# Patient Record
Sex: Female | Born: 1935
Health system: Southern US, Community
[De-identification: ages and names within clinical notes are randomized; demographics above are authoritative.]

## PROBLEM LIST (undated history)

## (undated) DIAGNOSIS — E559 Vitamin D deficiency, unspecified: Secondary | ICD-10-CM

## (undated) DIAGNOSIS — I447 Left bundle-branch block, unspecified: Secondary | ICD-10-CM

## (undated) DIAGNOSIS — F0390 Unspecified dementia without behavioral disturbance: Secondary | ICD-10-CM

## (undated) DIAGNOSIS — I10 Essential (primary) hypertension: Secondary | ICD-10-CM

## (undated) DIAGNOSIS — G4730 Sleep apnea, unspecified: Secondary | ICD-10-CM

## (undated) DIAGNOSIS — T8859XA Other complications of anesthesia, initial encounter: Secondary | ICD-10-CM

## (undated) DIAGNOSIS — E785 Hyperlipidemia, unspecified: Secondary | ICD-10-CM

## (undated) DIAGNOSIS — B079 Viral wart, unspecified: Secondary | ICD-10-CM

## (undated) DIAGNOSIS — R413 Other amnesia: Secondary | ICD-10-CM

## (undated) DIAGNOSIS — F419 Anxiety disorder, unspecified: Secondary | ICD-10-CM

## (undated) DIAGNOSIS — E7800 Pure hypercholesterolemia, unspecified: Secondary | ICD-10-CM

## (undated) DIAGNOSIS — T4145XA Adverse effect of unspecified anesthetic, initial encounter: Secondary | ICD-10-CM

## (undated) HISTORY — PX: CATARACT EXTRACTION, BILATERAL: SHX1313

## (undated) HISTORY — PX: TRIGGER FINGER RELEASE: SHX641

## (undated) HISTORY — PX: COLONOSCOPY: SHX174

## (undated) HISTORY — DX: Essential (primary) hypertension: I10

---
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
## 2009-12-13 ENCOUNTER — Ambulatory Visit: Payer: Self-pay | Admitting: Internal Medicine

## 2011-02-03 ENCOUNTER — Ambulatory Visit (INDEPENDENT_AMBULATORY_CARE_PROVIDER_SITE_OTHER): Payer: MEDICARE | Admitting: Internal Medicine

## 2011-02-03 ENCOUNTER — Other Ambulatory Visit (INDEPENDENT_AMBULATORY_CARE_PROVIDER_SITE_OTHER): Payer: Self-pay | Admitting: *Deleted

## 2011-02-03 ENCOUNTER — Encounter (INDEPENDENT_AMBULATORY_CARE_PROVIDER_SITE_OTHER): Payer: Self-pay | Admitting: Internal Medicine

## 2011-02-03 ENCOUNTER — Encounter (INDEPENDENT_AMBULATORY_CARE_PROVIDER_SITE_OTHER): Payer: Self-pay | Admitting: *Deleted

## 2011-02-03 VITALS — BP 110/72 | HR 76 | Temp 97.4°F | Resp 12 | Ht 62.0 in | Wt 112.2 lb

## 2011-02-03 DIAGNOSIS — K5900 Constipation, unspecified: Secondary | ICD-10-CM

## 2011-02-03 DIAGNOSIS — K5909 Other constipation: Secondary | ICD-10-CM | POA: Insufficient documentation

## 2011-02-03 DIAGNOSIS — I10 Essential (primary) hypertension: Secondary | ICD-10-CM | POA: Insufficient documentation

## 2011-02-03 MED ORDER — LUBIPROSTONE 8 MCG PO CAPS: 16.0000 ug | ORAL_CAPSULE | Freq: Every day | ORAL | Status: DC

## 2011-02-03 NOTE — Progress Notes (Signed)
Presenting complaint; Worsening constipation. Patient in need of colonoscopy. History of present illness. Jackie Phelps is a 78 year old Caucasian female who presents with worsening constipation. She has had this symptom for years but lately having more problems. She can go anywhere from 3-6 days without a bowel movement. She saw her primary care physician Dr. Rolly Pancake of [facility] who prescribed MiraLax and schedule her for colonoscopy. She noted satisfactory result with MiraLax initially but is not working anymore. She even doubled up on those one day but without any results. She was also scheduled for colonoscopy at the [facility] but she states it would be impossible for her to take prep and have a 17 miles and she therefore wants to have an exam at [facility].  she did have colonoscopy timing back in October 2000 revealing few diverticula sigmoid and transverse colon. She believes she had another colonoscopy without was well over 5 years ago. We do not have any records. Patient denies melena or rectal bleeding. She does complain of abdominal bloating but no pain or cramps. She has lost 6 pounds since her last visit in December 2011. She states she has a good appetite however she does not eat 3 meals a day. She denies heartburn nausea vomiting fever or chills. She states she takes diazepam because she is hyper. Farin states that she had complete blood work about 4 months ago at [facility]  and it was normal. She also has had 2 normal bone density studies. She is up-to-date on her mammography and pelvic exam in We be doing made 2013. Current Medications: Current Outpatient Prescriptions  Medication Sig Dispense Refill  . aspirin 81 MG tablet Take 81 mg by mouth daily.      . carvedilol (COREG) 6.25 MG tablet Take 6.25 mg by mouth 2 (two) times daily with a meal.      . diazepam (VALIUM) 5 MG tablet Take 5 mg by mouth as needed.      . fish oil-omega-3 fatty acids 1000 MG capsule Take 2 g by mouth daily.      Marland Kitchen  HYDROCHLOROTHIAZIDE PO Take 25 mg by mouth. Per the patient she takes 2 in the morning, 2 in the evening      . SIMVASTATIN PO Take by mouth daily.       past medical history; Hypertension. Hyperlipidemia. Hyperactivity(patient states she is hyper) Allergies;  Penicillin. Family history; Mother lived to be 73. Father had multiple myeloma and died at 11. One brother died at age 25.  She has a sister age 8 was doing fairly well. Social history; She is married. She has one daughter in good health. She smokes sporadically for a few years but quit over 40 years ago. She drinks glass or 2 of wine every now and then but not daily  Objective: Blood pressure 110/72, pulse 76, temperature 97.4 F (36.3 C), temperature source Oral, resp. rate 12, height 5\' 2"  (1.575 m), weight 112 lb 3.2 oz (50.894 kg). Pleasant well developed thin Caucasian female who is in no acute distress. Conjunctiva is pink. Sclera is nonicteric Oral pharyngeal mucosa is normal. No neck masses or thyromegaly noted. Cardiac exam with regular rhythm normal S1 and S2. No murmur or gallop noted. Lungs are clear to auscultation. Abdomen is symmetrical. Bowel sounds are normal. On palpation soft and nontender without organomegaly or masses. Rectal examination deferred. No LE edema or clubbing noted.  Assessment: Worsening constipation and otherwise healthy 78 year old female. Mild structural abnormality needs to be ruled out she most  likely has colonic dysmotility. Her last colonoscopy was over 5 years ago but we do not have any record. Patient had normal complete blood count last fall and I presume her serum calcium and TSH were normal   Plan: High fiber diet. Patient advised to eat 3 meals daily. Start Amitiza 16 mcg by mouth each bedtime. 16 doses (samples) given. He will be scheduled for a diagnostic colonoscopy at [facility] as it would be difficult for her to travel to [facility]. Patient also advised to take Os-Cal 500 one  tablet twice daily

## 2011-02-03 NOTE — Patient Instructions (Signed)
High fiber diet as discussed. Needs to eat 3 meals a day. Call if you experience side effects with Amitiza. Colonoscopy to be scheduled.

## 2011-02-25 ENCOUNTER — Ambulatory Visit (INDEPENDENT_AMBULATORY_CARE_PROVIDER_SITE_OTHER): Payer: Self-pay | Admitting: Internal Medicine

## 2011-03-03 ENCOUNTER — Encounter (HOSPITAL_COMMUNITY): Payer: Self-pay | Admitting: Pharmacy Technician

## 2011-03-04 MED ORDER — SODIUM CHLORIDE 0.45 % IV SOLN
Freq: Once | INTRAVENOUS | Status: AC
  Administered 2011-03-05: 11:00:00 via INTRAVENOUS

## 2011-03-05 ENCOUNTER — Encounter (HOSPITAL_COMMUNITY)
Admission: RE | Disposition: A | Discharge status: Home / Self Care | Payer: Self-pay | Source: Ambulatory Visit | Attending: Internal Medicine

## 2011-03-05 ENCOUNTER — Ambulatory Visit (HOSPITAL_COMMUNITY)
Admission: RE | Admit: 2011-03-05 | Discharge: 2011-03-05 | Disposition: A | Discharge status: Home / Self Care | Payer: Medicare Other | Source: Ambulatory Visit | Attending: Internal Medicine | Admitting: Internal Medicine

## 2011-03-05 ENCOUNTER — Encounter (HOSPITAL_COMMUNITY): Payer: Self-pay | Admitting: *Deleted

## 2011-03-05 DIAGNOSIS — K5730 Diverticulosis of large intestine without perforation or abscess without bleeding: Secondary | ICD-10-CM

## 2011-03-05 DIAGNOSIS — Z79899 Other long term (current) drug therapy: Secondary | ICD-10-CM | POA: Insufficient documentation

## 2011-03-05 DIAGNOSIS — K5909 Other constipation: Secondary | ICD-10-CM | POA: Insufficient documentation

## 2011-03-05 DIAGNOSIS — E785 Hyperlipidemia, unspecified: Secondary | ICD-10-CM | POA: Insufficient documentation

## 2011-03-05 DIAGNOSIS — K5900 Constipation, unspecified: Secondary | ICD-10-CM

## 2011-03-05 DIAGNOSIS — I10 Essential (primary) hypertension: Secondary | ICD-10-CM | POA: Insufficient documentation

## 2011-03-05 DIAGNOSIS — K6389 Other specified diseases of intestine: Secondary | ICD-10-CM | POA: Insufficient documentation

## 2011-03-05 HISTORY — PX: COLONOSCOPY: SHX5424

## 2011-03-05 HISTORY — DX: Pure hypercholesterolemia, unspecified: E78.00

## 2011-03-05 HISTORY — DX: Anxiety disorder, unspecified: F41.9

## 2011-03-05 SURGERY — COLONOSCOPY
Anesthesia: Moderate Sedation

## 2011-03-05 MED ORDER — MEPERIDINE HCL 50 MG/ML IJ SOLN
INTRAMUSCULAR | Status: DC | PRN
  Administered 2011-03-05 (×2): 25 mg via INTRAVENOUS

## 2011-03-05 MED ORDER — STERILE WATER FOR IRRIGATION IR SOLN
Status: DC | PRN
  Administered 2011-03-05: 11:00:00

## 2011-03-05 MED ORDER — MEPERIDINE HCL 50 MG/ML IJ SOLN
INTRAMUSCULAR | Status: AC
  Filled 2011-03-05: qty 1

## 2011-03-05 MED ORDER — MIDAZOLAM HCL 5 MG/5ML IJ SOLN
INTRAMUSCULAR | Status: AC
  Filled 2011-03-05: qty 10

## 2011-03-05 MED ORDER — MIDAZOLAM HCL 5 MG/5ML IJ SOLN
INTRAMUSCULAR | Status: DC | PRN
  Administered 2011-03-05: 1 mg via INTRAVENOUS
  Administered 2011-03-05: 2 mg via INTRAVENOUS
  Administered 2011-03-05: 1 mg via INTRAVENOUS

## 2011-03-05 MED ORDER — LUBIPROSTONE 8 MCG PO CAPS: 16.0000 ug | ORAL_CAPSULE | Freq: Two times a day (BID) | ORAL | Status: AC

## 2011-03-05 NOTE — H&P (Signed)
This is an update to history and physical from 02/03/2011. She noted excellent response with Amitiza. She is undergoing diagnostic colonoscopy

## 2011-03-05 NOTE — Discharge Instructions (Signed)
Resume usual medications and high fiber diet. Amitiza 16 mcg by mouth daily at bedtime. No driving for 24 hours

## 2011-03-05 NOTE — Op Note (Signed)
COLONOSCOPY PROCEDURE REPORT  PATIENT:  Jackie Phelps  MR#:  [id number] Birthdate:  May 10, 1931, 78 y.o., female Endoscopist:  Dr. Malissa Hippo, MD Referred By:  Dr. Rolly Pancake, MD Procedure Date: 03/05/2011  Procedure:   Colonoscopy  Indications: Patient is 78 year old Caucasian female who is undergoing diagnostic colonoscopy for worsening constipation.  Informed Consent:  Procedure and risks were reviewed with the patient and informed consent was obtained. Medications:  Demerol 50 mg IV Versed 4 mg IV  Description of procedure:  After a digital rectal exam was performed, that colonoscope was advanced from the anus through the rectum and colon to the area of the cecum, ileocecal valve and appendiceal orifice. The cecum was deeply intubated. These structures were well-seen and photographed for the record. From the level of the cecum and ileocecal valve, the scope was slowly and cautiously withdrawn. The mucosal surfaces were carefully surveyed utilizing scope tip to flexion to facilitate fold flattening as needed. The scope was pulled down into the rectum where a thorough exam including retroflexion was performed.  Findings:   Prep excellent. Few diverticula in sigmoid colon and two  at  hepatic flexure. Redundant colon without polyps or other mucosal abnormalities Normal rectal mucosa and ano-rectal junction.  Therapeutic/Diagnostic Maneuvers Performed:  None  Complications:  None  Cecal Withdrawal Time:  7 minutes  Impression:  Examination performed to cecum. Redundant colon with a few diverticula and sigmoid colon and two at hepatic flexure.  Recommendations:  High fiber diet. Amitiza 16 mcg by mouth daily.  Michaiah Holsopple U  03/05/2011 11:57 AM  CC: Dr. Rolly Pancake, MD, MD & Dr. Bonnetta Barry ref. provider found

## 2011-03-11 ENCOUNTER — Encounter (HOSPITAL_COMMUNITY): Payer: Self-pay | Admitting: Internal Medicine

## 2011-03-17 ENCOUNTER — Telehealth (INDEPENDENT_AMBULATORY_CARE_PROVIDER_SITE_OTHER): Payer: Self-pay | Admitting: *Deleted

## 2011-03-17 NOTE — Telephone Encounter (Signed)
Patient called and LM to please let Dr. Karilyn Cota know she was serverly dehydrated and constipated. Jackie Phelps is being taken care of and has gone back to her dr at [facility].

## 2011-03-18 NOTE — Telephone Encounter (Signed)
I called patient but there was no answer.

## 2012-08-31 ENCOUNTER — Ambulatory Visit (INDEPENDENT_AMBULATORY_CARE_PROVIDER_SITE_OTHER): Payer: Medicare Other | Admitting: Internal Medicine

## 2012-08-31 ENCOUNTER — Encounter (INDEPENDENT_AMBULATORY_CARE_PROVIDER_SITE_OTHER): Payer: Self-pay | Admitting: Internal Medicine

## 2012-08-31 VITALS — BP 108/60 | HR 76 | Temp 98.3°F | Ht 63.0 in | Wt 116.9 lb

## 2012-08-31 DIAGNOSIS — K5900 Constipation, unspecified: Secondary | ICD-10-CM

## 2012-08-31 MED ORDER — LUBIPROSTONE 8 MCG PO CAPS: 8.0000 ug | ORAL_CAPSULE | Freq: Every day | ORAL | Status: DC

## 2012-08-31 NOTE — Patient Instructions (Addendum)
Fiber 4gm po. Continue prunes. Samples of Amitiza x 2 boxes given to patient. Rx eprescribed to her drug store. Stool diary. OV in 8 weeks.

## 2012-08-31 NOTE — Progress Notes (Signed)
Subjective:     Patient ID: Jackie Phelps, female   DOB: 26-Jul-1931, 79 y.o.   MRN: [id number]  HPI Here today with c/o that she is constipated.  She tells me when she has a BM she has a small BM. This am she had a small BM and afterwards it was liquidity.  She has to strain to have a BM She has to wipe a lot. She has some seepage. Worsening smptoms for about a month. She tells me she drinks plenty of water.  She actually has a hx of chronic constipatin. Her primary care is att [facility] and sees Dr. Rolly Pancake.  She was seen last year and underwent a colonoscopy. Dr. Karilyn Cota gave her samples and she tells me the Amitza helped.  . She has not had a good BM in over a month. She has tried Citracel, Ducoloax, and Magnesioum Citrate which has not helped.  Her last colonoscopy wasin 2013. Her appetite is good. No weight loss. She tells me she has gained 3 pounds since becoming constipatied. 03/05/2011 Colonoscopy: worsening constipation:" Impression:  Examination performed to cecum.  Redundant colon with a few diverticula and sigmoid colon and two at hepatic flexure.       Review of Systems see hpi Current Outpatient Prescriptions  Medication Sig Dispense Refill  . amLODipine (NORVASC) 2.5 MG tablet Take 2.5 mg by mouth daily.      Marland Kitchen aspirin 81 MG tablet Take 81 mg by mouth daily. Twice a day      . carvedilol (COREG) 6.25 MG tablet Take 12.5 mg by mouth 2 (two) times daily with a meal.       . diazepam (VALIUM) 5 MG tablet Take 5 mg by mouth as needed.      . multivitamin-lutein (OCUVITE-LUTEIN) CAPS Take 1 capsule by mouth daily.      . simvastatin (ZOCOR) 20 MG tablet Take 20 mg by mouth daily.      Marland Kitchen lubiprostone (AMITIZA) 8 MCG capsule Take 1 capsule (8 mcg total) by mouth daily.  30 capsule  5   No current facility-administered medications for this visit.   Past Medical History  Diagnosis Date  . Hypertension   . Hypercholesteremia   . Anxiety   . Constipation    Past Surgical  History  Procedure Laterality Date  . Colonoscopy    . Colonoscopy  03/05/2011    Procedure: COLONOSCOPY;  Surgeon: Malissa Hippo, MD;  Location: [facility];  Service: Endoscopy;  Laterality: N/A;  1200   Allergies  Allergen Reactions  . Penicillins Rash         Objective:   Physical Exam  Filed Vitals:   08/31/12 1057  BP: 108/60  Pulse: 76  Temp: 98.3 F (36.8 C)  Height: 5\' 3"  (1.6 m)  Weight: 116 lb 14.4 oz (53.025 kg)  Alert and oriented. Skin warm and dry. Oral mucosa is moist.   . Sclera anicteric, conjunctivae is pink. Thyroid not enlarged. No cervical lymphadenopathy. Lungs clear. Heart regular rate and rhythm.  Abdomen is soft. Bowel sounds are positive. No hepatomegaly. No abdominal masses felt. No tenderness.  No edema to lower extremities.   There were no vitals filed for this visit.     Assessment:   Constipation: chronic. Hx of recent colonoscopy which was normal.  Patient has tried Citricel, Ducolax, ang Magnesium Citrate without results for her constipation. She did have a small BM this am and one last week. No impaction felt on  exam this am. Stool was soft in her rectum. She could have an impaction higher. Her abdomen is however soft and BS are positive    Plan:     Fiber 4gm. Continues the prunes. Samples of Amitiza x 2 boxes given to patient. Rx eprescribed to her drug store. Stool diary. OV in 8 weeks. If no BM in 5 day, try a Fleet's enema. If no results you may need to go to the ED.

## 2012-09-09 ENCOUNTER — Encounter (INDEPENDENT_AMBULATORY_CARE_PROVIDER_SITE_OTHER): Payer: Self-pay | Admitting: *Deleted

## 2012-09-09 NOTE — Progress Notes (Signed)
This encounter was created in error - please disregard.

## 2012-09-16 ENCOUNTER — Telehealth (INDEPENDENT_AMBULATORY_CARE_PROVIDER_SITE_OTHER): Payer: Self-pay | Admitting: Internal Medicine

## 2012-09-16 NOTE — Telephone Encounter (Signed)
She says Amitiza is not helping. I advised her to take one in the am and one in the Pm and call me Wednesday. If no BM she needs to take a Fleets enema. She has a hx of a redundant colon.

## 2012-09-23 ENCOUNTER — Telehealth (INDEPENDENT_AMBULATORY_CARE_PROVIDER_SITE_OTHER): Payer: Self-pay | Admitting: *Deleted

## 2012-09-23 NOTE — Telephone Encounter (Signed)
Please let Terri know the Valinda Hoar is doing so so. If she would like to speak with her the return phone number is [phone].

## 2012-09-29 NOTE — Telephone Encounter (Signed)
Has seen her Dr and was prescribed Losartan. I advised her to call her Dr. Janese Banks at [facility] about this medication. If she needs samples, I have samples of Linzess for her.

## 2012-09-29 NOTE — Telephone Encounter (Signed)
She was also placed on Metamucil and a stool softener. She was told to stop the Amitiza

## 2012-10-26 ENCOUNTER — Ambulatory Visit (INDEPENDENT_AMBULATORY_CARE_PROVIDER_SITE_OTHER): Payer: Medicare Other | Admitting: Internal Medicine

## 2013-10-15 ENCOUNTER — Encounter (HOSPITAL_COMMUNITY): Payer: Self-pay | Admitting: Emergency Medicine

## 2013-10-15 ENCOUNTER — Emergency Department (HOSPITAL_COMMUNITY)
Admission: EM | Admit: 2013-10-15 | Discharge: 2013-10-15 | Disposition: A | Discharge status: Home / Self Care | Payer: Medicare Other | Attending: Emergency Medicine | Admitting: Emergency Medicine

## 2013-10-15 DIAGNOSIS — F419 Anxiety disorder, unspecified: Secondary | ICD-10-CM | POA: Diagnosis not present

## 2013-10-15 DIAGNOSIS — Z7982 Long term (current) use of aspirin: Secondary | ICD-10-CM | POA: Insufficient documentation

## 2013-10-15 DIAGNOSIS — E780 Pure hypercholesterolemia: Secondary | ICD-10-CM | POA: Insufficient documentation

## 2013-10-15 DIAGNOSIS — Z880 Allergy status to penicillin: Secondary | ICD-10-CM | POA: Insufficient documentation

## 2013-10-15 DIAGNOSIS — I10 Essential (primary) hypertension: Secondary | ICD-10-CM | POA: Insufficient documentation

## 2013-10-15 DIAGNOSIS — Z79899 Other long term (current) drug therapy: Secondary | ICD-10-CM | POA: Diagnosis not present

## 2013-10-15 DIAGNOSIS — N390 Urinary tract infection, site not specified: Secondary | ICD-10-CM | POA: Diagnosis not present

## 2013-10-15 DIAGNOSIS — Z8719 Personal history of other diseases of the digestive system: Secondary | ICD-10-CM | POA: Diagnosis not present

## 2013-10-15 DIAGNOSIS — Z87891 Personal history of nicotine dependence: Secondary | ICD-10-CM | POA: Insufficient documentation

## 2013-10-15 DIAGNOSIS — R319 Hematuria, unspecified: Secondary | ICD-10-CM | POA: Diagnosis present

## 2013-10-15 LAB — URINALYSIS, ROUTINE W REFLEX MICROSCOPIC
Bilirubin Urine: NEGATIVE
Glucose, UA: NEGATIVE mg/dL
KETONES UR: NEGATIVE mg/dL
NITRITE: NEGATIVE
PROTEIN: 30 mg/dL — AB
Specific Gravity, Urine: <1.005 — ABNORMAL LOW (ref 1.005–1.030)
Urobilinogen, UA: 0.2 mg/dL (ref 0.0–1.0)
pH: 6.5 (ref 5.0–8.0)

## 2013-10-15 LAB — URINE MICROSCOPIC-ADD ON

## 2013-10-15 MED ORDER — SULFAMETHOXAZOLE-TMP DS 800-160 MG PO TABS: 1.0000 | ORAL_TABLET | Freq: Two times a day (BID) | ORAL | Status: DC

## 2013-10-15 NOTE — Discharge Instructions (Signed)
Urinary Tract Infection Urinary tract infections (UTIs) can develop anywhere along your urinary tract. Your urinary tract is your body's drainage system for removing wastes and extra water. Your urinary tract includes two kidneys, two ureters, a bladder, and a urethra. Your kidneys are a pair of bean-shaped organs. Each kidney is about the size of your fist. They are located below your ribs, one on each side of your spine. CAUSES Infections are caused by microbes, which are microscopic organisms, including fungi, viruses, and bacteria. These organisms are so small that they can only be seen through a microscope. Bacteria are the microbes that most commonly cause UTIs. SYMPTOMS  Symptoms of UTIs may vary by age and gender of the patient and by the location of the infection. Symptoms in young women typically include a frequent and intense urge to urinate and a painful, burning feeling in the bladder or urethra during urination. Older women and men are more likely to be tired, shaky, and weak and have muscle aches and abdominal pain. A fever may mean the infection is in your kidneys. Other symptoms of a kidney infection include pain in your back or sides below the ribs, nausea, and vomiting. DIAGNOSIS To diagnose a UTI, your caregiver will ask you about your symptoms. Your caregiver also will ask to provide a urine sample. The urine sample will be tested for bacteria and white blood cells. White blood cells are made by your body to help fight infection. TREATMENT  Typically, UTIs can be treated with medication. Because most UTIs are caused by a bacterial infection, they usually can be treated with the use of antibiotics. The choice of antibiotic and length of treatment depend on your symptoms and the type of bacteria causing your infection. HOME CARE INSTRUCTIONS  If you were prescribed antibiotics, take them exactly as your caregiver instructs you. Finish the medication even if you feel better after you  have only taken some of the medication.  Drink enough water and fluids to keep your urine clear or pale yellow.  Avoid caffeine, tea, and carbonated beverages. They tend to irritate your bladder.  Empty your bladder often. Avoid holding urine for long periods of time.  Empty your bladder before and after sexual intercourse.  After a bowel movement, women should cleanse from front to back. Use each tissue only once. SEEK MEDICAL CARE IF:   You have back pain.  You develop a fever.  Your symptoms do not begin to resolve within 3 days. SEEK IMMEDIATE MEDICAL CARE IF:   You have severe back pain or lower abdominal pain.  You develop chills.  You have nausea or vomiting.  You have continued burning or discomfort with urination. MAKE SURE YOU:   Understand these instructions.  Will watch your condition.  Will get help right away if you are not doing well or get worse. Document Released: 10/02/2004 Document Revised: 06/24/2011 Document Reviewed: 01/31/2011 [facility] Patient Information 2015 [place], [place]. This information is not intended to replace advice given to you by your health care provider. Make sure you discuss any questions you have with your health care provider.   As discussed, you will need to have a repeat urinalysis the week after your antibiotic is completed to make sure your infection is resolved.  Make sure you are drinking plenty of fluids.

## 2013-10-15 NOTE — ED Notes (Signed)
Pt noticed blood after wiping after voiding today.  Denies pain, denies frequency.

## 2013-10-17 NOTE — ED Provider Notes (Signed)
CSN: [id number]     Arrival date & time 10/15/13  1207 History   First MD Initiated Contact with Patient 10/15/13 1220     Chief Complaint  Patient presents with  . Hematuria     (Consider location/radiation/quality/duration/timing/severity/associated sxs/prior Treatment) Patient is a 80 y.o. female presenting with hematuria. The history is provided by the patient.  Hematuria This is a new problem. The current episode started today. The problem has been unchanged. Pertinent negatives include no abdominal pain, anorexia, change in bowel habit, chills, fatigue, fever, myalgias, nausea or vomiting. Associated symptoms comments: Denies painful urination, but has had increased urinary frequency.  She denies history of uti's and kidney stones, has had no recent exposures to abx.  Denies flank pain.. Nothing aggravates the symptoms. She has tried nothing for the symptoms.    Past Medical History  Diagnosis Date  . Hypertension   . Hypercholesteremia   . Anxiety   . Constipation    Past Surgical History  Procedure Laterality Date  . Colonoscopy    . Colonoscopy  03/05/2011    Procedure: COLONOSCOPY;  Surgeon: Rogene Houston, MD;  Location: [facility];  Service: Endoscopy;  Laterality: N/A;  1200   Family History  Problem Relation Age of Onset  . Hypertension Mother   . Healthy Daughter   . Colon cancer Neg Hx    History  Substance Use Topics  . Smoking status: Former Smoker    Types: Cigarettes    Quit date: 02/02/1966  . Smokeless tobacco: Never Used  . Alcohol Use: 1.2 oz/week    2 Glasses of wine per week     Comment: Drinks wine   OB History   Grav Para Term Preterm Abortions TAB SAB Ect Mult Living                 Review of Systems  Constitutional: Negative for fever, chills and fatigue.  Gastrointestinal: Negative for nausea, vomiting, abdominal pain, anorexia and change in bowel habit.  Genitourinary: Positive for frequency and hematuria. Negative for dysuria,  urgency, flank pain, difficulty urinating and pelvic pain.  Musculoskeletal: Negative for myalgias.      Allergies  Penicillins  Home Medications   Prior to Admission medications   Medication Sig Start Date End Date Taking? Authorizing Provider  aspirin 81 MG tablet Take 81 mg by mouth daily. Twice a day   Yes Historical Provider, MD  carvedilol (COREG) 6.25 MG tablet Take 12.5 mg by mouth 2 (two) times daily with a meal.    Yes Historical Provider, MD  diazepam (VALIUM) 5 MG tablet Take 5 mg by mouth as needed.   Yes Historical Provider, MD  losartan (COZAAR) 50 MG tablet Take 50 mg by mouth daily.   Yes Historical Provider, MD  multivitamin-lutein (OCUVITE-LUTEIN) CAPS Take 1 capsule by mouth daily.   Yes Historical Provider, MD  simvastatin (ZOCOR) 20 MG tablet Take 20 mg by mouth daily.   Yes Historical Provider, MD  sulfamethoxazole-trimethoprim (BACTRIM DS) 800-160 MG per tablet Take 1 tablet by mouth 2 (two) times daily. 10/15/13   Evalee Jefferson, PA-C   BP 146/65  Pulse 80  Temp(Src) 98.5 F (36.9 C) (Oral)  Resp 18  SpO2 100% Physical Exam  Nursing note and vitals reviewed. Constitutional: She appears well-developed and well-nourished.  HENT:  Head: Normocephalic and atraumatic.  Eyes: Conjunctivae are normal.  Neck: Normal range of motion.  Cardiovascular: Normal rate, regular rhythm, normal heart sounds and intact distal pulses.  Pulmonary/Chest: Effort normal and breath sounds normal. She has no wheezes.  Abdominal: Soft. Bowel sounds are normal. She exhibits no distension and no mass. There is no tenderness. There is no guarding.  No cva ttp.  Musculoskeletal: Normal range of motion.  Neurological: She is alert.  Skin: Skin is warm and dry.  Psychiatric: She has a normal mood and affect.    ED Course  Procedures (including critical care time) Labs Review Labs Reviewed  URINALYSIS, ROUTINE W REFLEX MICROSCOPIC - Abnormal; Notable for the following:    Color,  Urine AMBER (*)    APPearance HAZY (*)    Specific Gravity, Urine <1.005 (*)    Hgb urine dipstick LARGE (*)    Protein, ur 30 (*)    Leukocytes, UA MODERATE (*)    All other components within normal limits  URINE MICROSCOPIC-ADD ON - Abnormal; Notable for the following:    Bacteria, UA FEW (*)    All other components within normal limits  URINE CULTURE    Imaging Review No results found.   EKG Interpretation None      MDM   Final diagnoses:  UTI (lower urinary tract infection)    Pt placed on bactrim, first dose given in ed.  Advised f/u with pcp, encouraged increased fluids.  Return here for fevers, chills, nausea, pain.  Also advised she needs a repeat UA the week after abx completed.  Her pcp is at [place] lab order given for repeat UA - can get at our lab or lab of her choice in [place] where she lives.  Results to be sent to her pcp.  Pt and daughter at bedside understand plan.  The patient appears reasonably screened and/or stabilized for discharge and I doubt any other medical condition or other [facility] requiring further screening, evaluation, or treatment in the ED at this time prior to discharge.     Evalee Jefferson, PA-C 10/17/13 [id number]

## 2013-10-18 LAB — URINE CULTURE
Colony Count: 30000
SPECIAL REQUESTS: NORMAL

## 2013-10-18 NOTE — ED Provider Notes (Signed)
Medical screening examination/treatment/procedure(s) were performed by non-physician practitioner and as supervising physician I was immediately available for consultation/collaboration.   EKG Interpretation None        Maudry Diego, MD 10/18/13 1428

## 2013-10-19 ENCOUNTER — Telehealth (HOSPITAL_BASED_OUTPATIENT_CLINIC_OR_DEPARTMENT_OTHER): Payer: Self-pay | Admitting: Emergency Medicine

## 2013-10-19 NOTE — Telephone Encounter (Signed)
Post ED Visit - Positive Culture Follow-up  Culture report reviewed by antimicrobial stewardship pharmacist: []  Wes [place], Pharm.D., BCPS [x]  Heide Guile, Pharm.D., BCPS []  Alycia Rossetti, Pharm.D., BCPS []  [place], [place].D., BCPS, AAHIVP []  Legrand Como, Pharm.D., BCPS, AAHIVP []  Carly Sabat, Pharm.D. []  Elenor Quinones, Pharm.D.  Positive urine culture Treated with sulfamethoxazole, organism sensitive to the same and no further patient follow-up is required at this time.  Hazle Nordmann 10/19/2013, 10:59 AM

## 2013-10-27 ENCOUNTER — Telehealth (HOSPITAL_BASED_OUTPATIENT_CLINIC_OR_DEPARTMENT_OTHER): Payer: Self-pay | Admitting: Emergency Medicine

## 2014-01-08 ENCOUNTER — Inpatient Hospital Stay (HOSPITAL_COMMUNITY)
Admission: EM | Admit: 2014-01-08 | Discharge: 2014-01-12 | DRG: 470 | Disposition: A | Discharge status: Skilled Nursing Facility | Payer: Medicare Other | Attending: Internal Medicine | Admitting: Internal Medicine

## 2014-01-08 ENCOUNTER — Emergency Department (HOSPITAL_COMMUNITY): Payer: Medicare Other

## 2014-01-08 ENCOUNTER — Inpatient Hospital Stay (HOSPITAL_COMMUNITY): Payer: Medicare Other | Admitting: Anesthesiology

## 2014-01-08 ENCOUNTER — Inpatient Hospital Stay (HOSPITAL_COMMUNITY): Payer: Medicare Other

## 2014-01-08 ENCOUNTER — Encounter (HOSPITAL_COMMUNITY)
Admission: EM | Disposition: A | Discharge status: Skilled Nursing Facility | Payer: Self-pay | Source: Home / Self Care | Attending: Internal Medicine

## 2014-01-08 ENCOUNTER — Encounter (HOSPITAL_COMMUNITY): Payer: Self-pay | Admitting: Emergency Medicine

## 2014-01-08 DIAGNOSIS — F419 Anxiety disorder, unspecified: Secondary | ICD-10-CM | POA: Diagnosis present

## 2014-01-08 DIAGNOSIS — E785 Hyperlipidemia, unspecified: Secondary | ICD-10-CM | POA: Diagnosis present

## 2014-01-08 DIAGNOSIS — Z96649 Presence of unspecified artificial hip joint: Secondary | ICD-10-CM

## 2014-01-08 DIAGNOSIS — I10 Essential (primary) hypertension: Secondary | ICD-10-CM | POA: Diagnosis present

## 2014-01-08 DIAGNOSIS — S72002A Fracture of unspecified part of neck of left femur, initial encounter for closed fracture: Principal | ICD-10-CM | POA: Diagnosis present

## 2014-01-08 DIAGNOSIS — Z8249 Family history of ischemic heart disease and other diseases of the circulatory system: Secondary | ICD-10-CM

## 2014-01-08 DIAGNOSIS — W1839XA Other fall on same level, initial encounter: Secondary | ICD-10-CM | POA: Diagnosis present

## 2014-01-08 DIAGNOSIS — M25559 Pain in unspecified hip: Secondary | ICD-10-CM

## 2014-01-08 DIAGNOSIS — Z87891 Personal history of nicotine dependence: Secondary | ICD-10-CM

## 2014-01-08 DIAGNOSIS — K5900 Constipation, unspecified: Secondary | ICD-10-CM | POA: Diagnosis not present

## 2014-01-08 DIAGNOSIS — D62 Acute posthemorrhagic anemia: Secondary | ICD-10-CM | POA: Diagnosis not present

## 2014-01-08 DIAGNOSIS — E780 Pure hypercholesterolemia: Secondary | ICD-10-CM | POA: Diagnosis present

## 2014-01-08 DIAGNOSIS — Z7982 Long term (current) use of aspirin: Secondary | ICD-10-CM

## 2014-01-08 DIAGNOSIS — K5909 Other constipation: Secondary | ICD-10-CM | POA: Diagnosis present

## 2014-01-08 HISTORY — PX: HIP ARTHROPLASTY: SHX981

## 2014-01-08 LAB — URINALYSIS, ROUTINE W REFLEX MICROSCOPIC
Bilirubin Urine: NEGATIVE
Glucose, UA: NEGATIVE mg/dL
KETONES UR: NEGATIVE mg/dL
Leukocytes, UA: NEGATIVE
NITRITE: NEGATIVE
PH: 7.5 (ref 5.0–8.0)
PROTEIN: NEGATIVE mg/dL
Specific Gravity, Urine: 1.012 (ref 1.005–1.030)
UROBILINOGEN UA: 0.2 mg/dL (ref 0.0–1.0)

## 2014-01-08 LAB — BASIC METABOLIC PANEL
ANION GAP: 10 (ref 5–15)
BUN: 19 mg/dL (ref 6–23)
CHLORIDE: 105 meq/L (ref 96–112)
CO2: 24 mmol/L (ref 19–32)
CREATININE: 0.86 mg/dL (ref 0.50–1.10)
Calcium: 9.4 mg/dL (ref 8.4–10.5)
GFR, EST AFRICAN AMERICAN: 72 mL/min — AB (ref >90)
GFR, EST NON AFRICAN AMERICAN: 62 mL/min — AB (ref >90)
Glucose, Bld: 143 mg/dL — ABNORMAL HIGH (ref 70–99)
POTASSIUM: 3.8 mmol/L (ref 3.5–5.1)
Sodium: 139 mmol/L (ref 135–145)

## 2014-01-08 LAB — CBC
HCT: 38.7 % (ref 36.0–46.0)
Hemoglobin: 12.6 g/dL (ref 12.0–15.0)
MCH: 29.1 pg (ref 26.0–34.0)
MCHC: 32.6 g/dL (ref 30.0–36.0)
MCV: 89.4 fL (ref 78.0–100.0)
PLATELETS: 173 10*3/uL (ref 150–400)
RBC: 4.33 MIL/uL (ref 3.87–5.11)
RDW: 12.7 % (ref 11.5–15.5)
WBC: 10.5 10*3/uL (ref 4.0–10.5)

## 2014-01-08 LAB — URINE MICROSCOPIC-ADD ON

## 2014-01-08 SURGERY — HEMIARTHROPLASTY, HIP, DIRECT ANTERIOR APPROACH, FOR FRACTURE
Anesthesia: Monitor Anesthesia Care | Site: Hip | Laterality: Left

## 2014-01-08 MED ORDER — ASPIRIN 81 MG PO CHEW
81.0000 mg | CHEWABLE_TABLET | Freq: Every day | ORAL | Status: DC
  Administered 2014-01-09 – 2014-01-12 (×4): 81 mg via ORAL
  Filled 2014-01-08 (×4): qty 1

## 2014-01-08 MED ORDER — ACETAMINOPHEN 650 MG RE SUPP: 650.0000 mg | Freq: Four times a day (QID) | RECTAL | Status: DC | PRN

## 2014-01-08 MED ORDER — OXYCODONE HCL 5 MG PO TABS: 5.0000 mg | ORAL_TABLET | ORAL | Status: DC | PRN

## 2014-01-08 MED ORDER — ONDANSETRON HCL 4 MG/2ML IJ SOLN
4.0000 mg | Freq: Four times a day (QID) | INTRAMUSCULAR | Status: DC | PRN
  Administered 2014-01-08: 4 mg via INTRAVENOUS
  Filled 2014-01-08 (×2): qty 2

## 2014-01-08 MED ORDER — CLINDAMYCIN PHOSPHATE 600 MG/50ML IV SOLN
INTRAVENOUS | Status: AC
  Administered 2014-01-08: 600 mg via INTRAVENOUS
  Filled 2014-01-08: qty 50

## 2014-01-08 MED ORDER — KETOROLAC TROMETHAMINE 15 MG/ML IJ SOLN: 15.0000 mg | Freq: Once | INTRAMUSCULAR | Status: DC | PRN

## 2014-01-08 MED ORDER — FLEET ENEMA 7-19 GM/118ML RE ENEM: 1.0000 | ENEMA | Freq: Once | RECTAL | Status: DC | PRN

## 2014-01-08 MED ORDER — HYDROMORPHONE HCL 1 MG/ML IJ SOLN
0.5000 mg | Freq: Once | INTRAMUSCULAR | Status: AC
  Administered 2014-01-08: 0.5 mg via INTRAVENOUS
  Filled 2014-01-08: qty 1

## 2014-01-08 MED ORDER — SORBITOL 70 % SOLN
30.0000 mL | Freq: Every day | Status: DC | PRN
  Administered 2014-01-11: 30 mL via ORAL
  Filled 2014-01-08: qty 30

## 2014-01-08 MED ORDER — KETOROLAC TROMETHAMINE 30 MG/ML IJ SOLN
30.0000 mg | Freq: Four times a day (QID) | INTRAMUSCULAR | Status: AC | PRN
  Administered 2014-01-08: 30 mg via INTRAVENOUS
  Filled 2014-01-08: qty 1

## 2014-01-08 MED ORDER — SENNA 8.6 MG PO TABS
1.0000 | ORAL_TABLET | Freq: Two times a day (BID) | ORAL | Status: DC
  Administered 2014-01-09 – 2014-01-12 (×7): 8.6 mg via ORAL
  Filled 2014-01-08 (×9): qty 1

## 2014-01-08 MED ORDER — PHENYLEPHRINE HCL 10 MG/ML IJ SOLN
10.0000 mg | INTRAVENOUS | Status: DC | PRN
  Administered 2014-01-08: 80 ug/min via INTRAVENOUS

## 2014-01-08 MED ORDER — PROPOFOL 10 MG/ML IV BOLUS
INTRAVENOUS | Status: AC
  Filled 2014-01-08: qty 20

## 2014-01-08 MED ORDER — FENTANYL CITRATE 0.05 MG/ML IJ SOLN
25.0000 ug | INTRAMUSCULAR | Status: DC | PRN
  Administered 2014-01-08: 25 ug via INTRAVENOUS
  Administered 2014-01-08: 50 ug via INTRAVENOUS

## 2014-01-08 MED ORDER — MAGNESIUM CITRATE PO SOLN: 1.0000 | Freq: Once | ORAL | Status: AC | PRN

## 2014-01-08 MED ORDER — PROPOFOL 10 MG/ML IV BOLUS
INTRAVENOUS | Status: DC | PRN
  Administered 2014-01-08 (×2): 10 mg via INTRAVENOUS

## 2014-01-08 MED ORDER — METHOCARBAMOL 500 MG PO TABS
500.0000 mg | ORAL_TABLET | Freq: Four times a day (QID) | ORAL | Status: DC | PRN
  Administered 2014-01-09 – 2014-01-12 (×3): 500 mg via ORAL
  Filled 2014-01-08 (×4): qty 1

## 2014-01-08 MED ORDER — SODIUM CHLORIDE 0.9 % IR SOLN
Status: DC | PRN
  Administered 2014-01-08: 3000 mL
  Administered 2014-01-08: 1000 mL

## 2014-01-08 MED ORDER — METOCLOPRAMIDE HCL 5 MG/ML IJ SOLN: 5.0000 mg | Freq: Three times a day (TID) | INTRAMUSCULAR | Status: DC | PRN

## 2014-01-08 MED ORDER — HYDRALAZINE HCL 20 MG/ML IJ SOLN: 10.0000 mg | Freq: Four times a day (QID) | INTRAMUSCULAR | Status: DC | PRN

## 2014-01-08 MED ORDER — FENTANYL CITRATE 0.05 MG/ML IJ SOLN
INTRAMUSCULAR | Status: AC
  Filled 2014-01-08: qty 2

## 2014-01-08 MED ORDER — FENTANYL CITRATE 0.05 MG/ML IJ SOLN
INTRAMUSCULAR | Status: DC | PRN
  Administered 2014-01-08 (×3): 50 ug via INTRAVENOUS

## 2014-01-08 MED ORDER — LOSARTAN POTASSIUM 50 MG PO TABS
50.0000 mg | ORAL_TABLET | Freq: Every day | ORAL | Status: DC
  Administered 2014-01-09 – 2014-01-12 (×4): 50 mg via ORAL
  Filled 2014-01-08 (×4): qty 1

## 2014-01-08 MED ORDER — LIDOCAINE HCL (CARDIAC) 20 MG/ML IV SOLN
INTRAVENOUS | Status: AC
  Filled 2014-01-08: qty 5

## 2014-01-08 MED ORDER — MENTHOL 3 MG MT LOZG: 1.0000 | LOZENGE | OROMUCOSAL | Status: DC | PRN

## 2014-01-08 MED ORDER — HYDROCODONE-ACETAMINOPHEN 5-325 MG PO TABS
1.0000 | ORAL_TABLET | Freq: Four times a day (QID) | ORAL | Status: DC | PRN
  Administered 2014-01-09 – 2014-01-12 (×8): 2 via ORAL
  Filled 2014-01-08 (×2): qty 2
  Filled 2014-01-08: qty 1
  Filled 2014-01-08 (×4): qty 2
  Filled 2014-01-08: qty 1
  Filled 2014-01-08 (×3): qty 2

## 2014-01-08 MED ORDER — ALUM & MAG HYDROXIDE-SIMETH 200-200-20 MG/5ML PO SUSP: 30.0000 mL | ORAL | Status: DC | PRN

## 2014-01-08 MED ORDER — CLINDAMYCIN PHOSPHATE 600 MG/50ML IV SOLN
600.0000 mg | Freq: Four times a day (QID) | INTRAVENOUS | Status: AC
  Administered 2014-01-09 (×2): 600 mg via INTRAVENOUS
  Filled 2014-01-08 (×3): qty 50

## 2014-01-08 MED ORDER — ONDANSETRON HCL 4 MG/2ML IJ SOLN
INTRAMUSCULAR | Status: AC
  Filled 2014-01-08: qty 2

## 2014-01-08 MED ORDER — ONDANSETRON HCL 4 MG PO TABS: 4.0000 mg | ORAL_TABLET | Freq: Four times a day (QID) | ORAL | Status: DC | PRN

## 2014-01-08 MED ORDER — OXYCODONE HCL 5 MG PO TABS: 5.0000 mg | ORAL_TABLET | Freq: Once | ORAL | Status: DC | PRN

## 2014-01-08 MED ORDER — MORPHINE SULFATE 2 MG/ML IJ SOLN
0.5000 mg | INTRAMUSCULAR | Status: DC | PRN
  Administered 2014-01-09: 0.5 mg via INTRAVENOUS
  Filled 2014-01-08: qty 1

## 2014-01-08 MED ORDER — ENOXAPARIN SODIUM 40 MG/0.4ML ~~LOC~~ SOLN
40.0000 mg | SUBCUTANEOUS | Status: DC
  Administered 2014-01-09 – 2014-01-12 (×3): 40 mg via SUBCUTANEOUS
  Filled 2014-01-08 (×5): qty 0.4

## 2014-01-08 MED ORDER — HYDROCODONE-ACETAMINOPHEN 5-325 MG PO TABS: 1.0000 | ORAL_TABLET | Freq: Four times a day (QID) | ORAL | Status: DC | PRN

## 2014-01-08 MED ORDER — OXYCODONE HCL 5 MG/5ML PO SOLN
5.0000 mg | Freq: Once | ORAL | Status: DC | PRN
Start: 2014-01-08 — End: 2014-01-08

## 2014-01-08 MED ORDER — OXYCODONE HCL 5 MG PO TABS
5.0000 mg | ORAL_TABLET | ORAL | Status: DC | PRN
  Administered 2014-01-09: 5 mg via ORAL
  Administered 2014-01-09: 10 mg via ORAL
  Filled 2014-01-08: qty 2
  Filled 2014-01-08: qty 1

## 2014-01-08 MED ORDER — ENOXAPARIN SODIUM 40 MG/0.4ML ~~LOC~~ SOLN: 40.0000 mg | Freq: Every day | SUBCUTANEOUS | Status: DC

## 2014-01-08 MED ORDER — LACTATED RINGERS IV SOLN
INTRAVENOUS | Status: DC | PRN
  Administered 2014-01-08 (×2): via INTRAVENOUS

## 2014-01-08 MED ORDER — HYDROMORPHONE HCL 1 MG/ML IJ SOLN
1.0000 mg | Freq: Once | INTRAMUSCULAR | Status: AC
  Administered 2014-01-08: 1 mg via INTRAVENOUS
  Filled 2014-01-08: qty 1

## 2014-01-08 MED ORDER — METOCLOPRAMIDE HCL 10 MG PO TABS: 5.0000 mg | ORAL_TABLET | Freq: Three times a day (TID) | ORAL | Status: DC | PRN

## 2014-01-08 MED ORDER — ONDANSETRON HCL 4 MG/2ML IJ SOLN
4.0000 mg | Freq: Once | INTRAMUSCULAR | Status: AC
  Administered 2014-01-08: 4 mg via INTRAVENOUS
  Filled 2014-01-08: qty 2

## 2014-01-08 MED ORDER — DIAZEPAM 5 MG PO TABS: 5.0000 mg | ORAL_TABLET | Freq: Two times a day (BID) | ORAL | Status: DC | PRN

## 2014-01-08 MED ORDER — PHENOL 1.4 % MT LIQD: 1.0000 | OROMUCOSAL | Status: DC | PRN

## 2014-01-08 MED ORDER — SUCCINYLCHOLINE CHLORIDE 20 MG/ML IJ SOLN
INTRAMUSCULAR | Status: AC
  Filled 2014-01-08: qty 1

## 2014-01-08 MED ORDER — ONDANSETRON HCL 4 MG/2ML IJ SOLN
INTRAMUSCULAR | Status: DC | PRN
  Administered 2014-01-08: 4 mg via INTRAVENOUS

## 2014-01-08 MED ORDER — CARVEDILOL 12.5 MG PO TABS
12.5000 mg | ORAL_TABLET | Freq: Two times a day (BID) | ORAL | Status: DC
  Administered 2014-01-09 – 2014-01-12 (×7): 12.5 mg via ORAL
  Filled 2014-01-08 (×9): qty 1

## 2014-01-08 MED ORDER — KETOROLAC TROMETHAMINE 30 MG/ML IJ SOLN
INTRAMUSCULAR | Status: AC
  Filled 2014-01-08: qty 1

## 2014-01-08 MED ORDER — ESCITALOPRAM OXALATE 5 MG PO TABS
5.0000 mg | ORAL_TABLET | Freq: Every day | ORAL | Status: DC
Start: 2014-01-08 — End: 2014-01-12
  Administered 2014-01-09 – 2014-01-12 (×4): 5 mg via ORAL
  Filled 2014-01-08 (×4): qty 1

## 2014-01-08 MED ORDER — MIDAZOLAM HCL 2 MG/2ML IJ SOLN
INTRAMUSCULAR | Status: DC | PRN
  Administered 2014-01-08: 2 mg via INTRAVENOUS

## 2014-01-08 MED ORDER — SIMVASTATIN 20 MG PO TABS
20.0000 mg | ORAL_TABLET | Freq: Every day | ORAL | Status: DC
  Administered 2014-01-09 – 2014-01-12 (×4): 20 mg via ORAL
  Filled 2014-01-08 (×4): qty 1

## 2014-01-08 MED ORDER — ACETAMINOPHEN 325 MG PO TABS: 650.0000 mg | ORAL_TABLET | Freq: Four times a day (QID) | ORAL | Status: DC | PRN

## 2014-01-08 MED ORDER — METHOCARBAMOL 500 MG PO TABS: 500.0000 mg | ORAL_TABLET | Freq: Four times a day (QID) | ORAL | Status: DC | PRN

## 2014-01-08 MED ORDER — FENTANYL CITRATE 0.05 MG/ML IJ SOLN
INTRAMUSCULAR | Status: AC
  Filled 2014-01-08: qty 5

## 2014-01-08 MED ORDER — SENNA 8.6 MG PO TABS: 1.0000 | ORAL_TABLET | Freq: Two times a day (BID) | ORAL | Status: DC

## 2014-01-08 MED ORDER — PROPOFOL INFUSION 10 MG/ML OPTIME
INTRAVENOUS | Status: DC | PRN
  Administered 2014-01-08: 50 ug/kg/min via INTRAVENOUS

## 2014-01-08 MED ORDER — MIDAZOLAM HCL 2 MG/2ML IJ SOLN
INTRAMUSCULAR | Status: AC
  Filled 2014-01-08: qty 2

## 2014-01-08 MED ORDER — POLYETHYLENE GLYCOL 3350 17 G PO PACK: 17.0000 g | PACK | Freq: Every day | ORAL | Status: DC | PRN

## 2014-01-08 MED ORDER — MORPHINE SULFATE 2 MG/ML IJ SOLN: 0.5000 mg | INTRAMUSCULAR | Status: DC | PRN

## 2014-01-08 MED ORDER — SODIUM CHLORIDE 0.9 % IV SOLN
INTRAVENOUS | Status: DC
  Administered 2014-01-08 – 2014-01-09 (×2): via INTRAVENOUS

## 2014-01-08 MED ORDER — ONDANSETRON HCL 4 MG/2ML IJ SOLN: 4.0000 mg | Freq: Once | INTRAMUSCULAR | Status: DC | PRN

## 2014-01-08 MED ORDER — METHOCARBAMOL 1000 MG/10ML IJ SOLN
500.0000 mg | Freq: Four times a day (QID) | INTRAVENOUS | Status: DC | PRN
  Filled 2014-01-08: qty 5

## 2014-01-08 MED ORDER — METHOCARBAMOL 1000 MG/10ML IJ SOLN: 500.0000 mg | Freq: Four times a day (QID) | INTRAVENOUS | Status: DC | PRN

## 2014-01-08 SURGICAL SUPPLY — 41 items
BLADE SAGITTAL (BLADE) ×2
BLADE SAW THK.89X75X18XSGTL (BLADE) ×1 IMPLANT
CAPT HIP HEMI 2 ×3 IMPLANT
COVER SURGICAL LIGHT HANDLE (MISCELLANEOUS) ×3 IMPLANT
DRAPE HIP W/POCKET STRL (DRAPE) ×3 IMPLANT
DRAPE IMP U-DRAPE 54X76 (DRAPES) ×3 IMPLANT
DRAPE INCISE IOBAN 85X60 (DRAPES) ×3 IMPLANT
DRAPE U-SHAPE 47X51 STRL (DRAPES) ×3 IMPLANT
DRSG AQUACEL AG ADV 3.5X10 (GAUZE/BANDAGES/DRESSINGS) ×3 IMPLANT
DRSG MEPILEX BORDER 4X8 (GAUZE/BANDAGES/DRESSINGS) IMPLANT
DURAPREP 26ML APPLICATOR (WOUND CARE) ×6 IMPLANT
ELECT CAUTERY BLADE 6.4 (BLADE) ×3 IMPLANT
ELECT REM PT RETURN 9FT ADLT (ELECTROSURGICAL) ×3
ELECTRODE REM PT RTRN 9FT ADLT (ELECTROSURGICAL) ×1 IMPLANT
EVACUATOR 1/8 PVC DRAIN (DRAIN) IMPLANT
FACESHIELD WRAPAROUND (MASK) ×3 IMPLANT
GLOVE BIO SURGEON STRL SZ7 (GLOVE) ×9 IMPLANT
GLOVE NEODERM STRL 7.5 LF PF (GLOVE) ×2 IMPLANT
GLOVE SURG NEODERM 7.5  LF PF (GLOVE) ×4
GOWN STRL REIN XL XLG (GOWN DISPOSABLE) ×3 IMPLANT
HANDPIECE INTERPULSE COAX TIP (DISPOSABLE) ×2
KIT BASIN OR (CUSTOM PROCEDURE TRAY) ×3 IMPLANT
KIT ROOM TURNOVER OR (KITS) ×3 IMPLANT
MANIFOLD NEPTUNE II (INSTRUMENTS) ×3 IMPLANT
NS IRRIG 1000ML POUR BTL (IV SOLUTION) ×3 IMPLANT
PACK SHOULDER (CUSTOM PROCEDURE TRAY) ×3 IMPLANT
PACK UNIVERSAL I (CUSTOM PROCEDURE TRAY) ×3 IMPLANT
PAD ARMBOARD 7.5X6 YLW CONV (MISCELLANEOUS) ×6 IMPLANT
SET HNDPC FAN SPRY TIP SCT (DISPOSABLE) ×1 IMPLANT
STAPLER VISISTAT 35W (STAPLE) IMPLANT
SUT ETHIBOND 2 V 37 (SUTURE) ×6 IMPLANT
SUT PDS AB 1 CT  36 (SUTURE) ×4
SUT PDS AB 1 CT 36 (SUTURE) ×2 IMPLANT
SUT VIC AB 0 CT1 27 (SUTURE) ×2
SUT VIC AB 0 CT1 27XBRD ANBCTR (SUTURE) ×1 IMPLANT
SUT VIC AB 1 CTB1 27 (SUTURE) ×3 IMPLANT
SUT VIC AB 1 CTX 27 (SUTURE) ×3 IMPLANT
SUT VIC AB 2-0 CT1 27 (SUTURE) ×4
SUT VIC AB 2-0 CT1 TAPERPNT 27 (SUTURE) ×2 IMPLANT
TOWEL OR 17X24 6PK STRL BLUE (TOWEL DISPOSABLE) ×3 IMPLANT
TOWEL OR 17X26 10 PK STRL BLUE (TOWEL DISPOSABLE) ×3 IMPLANT

## 2014-01-08 NOTE — Anesthesia Preprocedure Evaluation (Signed)
Anesthesia Evaluation  Patient identified by MRN, date of birth, ID band Patient awake    Reviewed: Allergy & Precautions, H&P , NPO status , Patient's Chart, lab work & pertinent test results  Airway Mallampati: II  TM Distance: >3 FB Neck ROM: Full    Dental  (+) Teeth Intact, Dental Advisory Given   Pulmonary former smoker,  breath sounds clear to auscultation        Cardiovascular hypertension, Rhythm:Regular Rate:Normal     Neuro/Psych    GI/Hepatic   Endo/Other    Renal/GU      Musculoskeletal   Abdominal (+) - obese,   Peds  Hematology   Anesthesia Other Findings   Reproductive/Obstetrics                             Anesthesia Physical Anesthesia Plan  ASA: III  Anesthesia Plan: MAC and Spinal   Post-op Pain Management:    Induction: Intravenous  Airway Management Planned: Simple Face Mask  Additional Equipment:   Intra-op Plan:   Post-operative Plan:   Informed Consent: I have reviewed the patients History and Physical, chart, labs and discussed the procedure including the risks, benefits and alternatives for the proposed anesthesia with the patient or authorized representative who has indicated his/her understanding and acceptance.   Dental advisory given  Plan Discussed with: CRNA and Anesthesiologist  Anesthesia Plan Comments:         Anesthesia Quick Evaluation

## 2014-01-08 NOTE — Consult Note (Signed)
ORTHOPAEDIC CONSULTATION  REQUESTING PHYSICIAN: Jonetta Osgood, MD  Chief Complaint: Left hip fx  HPI: Jackie Phelps is a 81 y.o. female who complains of left hip fx s/p mechanical fall at home.  Denies LOC, neck pain, abd pain.  Evaluated in ER and found to have left femoral neck fx.  Ortho consulted.  Past Medical History  Diagnosis Date  . Hypertension   . Hypercholesteremia   . Anxiety   . Constipation    Past Surgical History  Procedure Laterality Date  . Colonoscopy    . Colonoscopy  03/05/2011    Procedure: COLONOSCOPY;  Surgeon: Rogene Houston, MD;  Location: [facility];  Service: Endoscopy;  Laterality: N/A;  1200   History   Social History  . Marital Status: Married    Spouse Name: N/A    Number of Children: N/A  . Years of Education: N/A   Social History Main Topics  . Smoking status: Former Smoker    Types: Cigarettes    Quit date: 02/02/1966  . Smokeless tobacco: Never Used  . Alcohol Use: 1.2 oz/week    2 Glasses of wine per week     Comment: Drinks wine  . Drug Use: No  . Sexual Activity: None   Other Topics Concern  . None   Social History Narrative   Family History  Problem Relation Age of Onset  . Hypertension Mother   . Healthy Daughter   . Colon cancer Neg Hx    Allergies  Allergen Reactions  . Penicillins Rash   Prior to Admission medications   Medication Sig Start Date End Date Taking? Authorizing Provider  aspirin 81 MG tablet Take 81 mg by mouth daily. Twice a day    Historical Provider, MD  carvedilol (COREG) 6.25 MG tablet Take 12.5 mg by mouth 2 (two) times daily with a meal.     Historical Provider, MD  diazepam (VALIUM) 5 MG tablet Take 5 mg by mouth as needed.    Historical Provider, MD  escitalopram (LEXAPRO) 5 MG tablet Take 5 mg by mouth daily. 12/07/13   Historical Provider, MD  losartan (COZAAR) 25 MG tablet Take 25 mg by mouth daily. 11/03/13   Historical Provider, MD  losartan (COZAAR) 50 MG tablet Take 50  mg by mouth daily.    Historical Provider, MD  multivitamin-lutein [facility]) CAPS Take 1 capsule by mouth daily.    Historical Provider, MD  simvastatin (ZOCOR) 20 MG tablet Take 20 mg by mouth daily.    Historical Provider, MD  sulfamethoxazole-trimethoprim (BACTRIM DS) 800-160 MG per tablet Take 1 tablet by mouth 2 (two) times daily. 10/15/13   Evalee Jefferson, PA-C   Dg Chest 1 View  01/08/2014   CLINICAL DATA:  Acute left hip fracture.  EXAM: CHEST - 1 VIEW  COMPARISON:  None.  FINDINGS: The heart size and mediastinal contours are normal. There is no evidence of pulmonary edema, consolidation, pneumothorax, nodule or pleural fluid. No fractures identified.  IMPRESSION: No active disease in the chest.   Electronically Signed   By: Aletta Edouard M.D.   On: 01/08/2014 13:56   Dg Hip Complete Left  01/08/2014   CLINICAL DATA:  Acute left hip pain after fall today.  EXAM: LEFT HIP - COMPLETE 2+ VIEW  COMPARISON:  None.  FINDINGS: Moderately displaced and angulated fracture of the proximal left femoral neck is noted. Femoral head is situated within the acetabulum. This appears to be closed and posttraumatic.  IMPRESSION:  Moderately displaced and angulated proximal left femoral neck fracture. This is the initial encounter.   Electronically Signed   By: Sabino Dick M.D.   On: 01/08/2014 13:57    Positive ROS: All other systems have been reviewed and were otherwise negative with the exception of those mentioned in the HPI and as above.  Physical Exam: General: Alert, no acute distress Cardiovascular: No pedal edema Respiratory: No cyanosis, no use of accessory musculature GI: No organomegaly, abdomen is soft and non-tender Skin: No lesions in the area of chief complaint Neurologic: Sensation intact distally Psychiatric: Patient is competent for consent with normal mood and affect Lymphatic: No axillary or cervical lymphadenopathy  MUSCULOSKELETAL:  - very painful ROM of left hip and LLE -  skin intact - NVI LLE  Assessment: Left femoral neck fracture  Plan: - NPO - plan for surgery today - discussed r/b/a with patient and wishes to proceed - admit to hospitalist - Based on history and fracture pattern this likely represents a fragility fracture. - Fragility fractures affect up to one half of women and one third of men after age 32 years and occur in the setting of bone disorder such as osteoporosis or osteopenia and warrant appropriate work-up. - The following are general recommendations that may serve as an outline for an appropriate work-up:  1.) Obtain bone density measurement to confirm presumptive diagnosis, assess severity of osteoporosis and risk of future fracture, and use as baseline for monitoring treatment  2.) Obtain laboratory tests: CBC, ESR, serum calcium, creatinine, albumin,phosphate, alkaline phosphatase, liver transaminases, protein electrophoresis, urinalysis, 25-hydroxyvitamin D.  3.) Exclude secondary causes of low bone mass and skeletal fragility (eg,multiple myeloma, lymphoma) as indicated.  4.) Obtain radiograph of thoracic and lumbar spine, particularly among individuals with back pain or height loss to assess presence of vertebral fractures  5.) Intermittent administration of recombinant human parathyroid hormone  6.) Optimize nutritional status using nutritional supplementation.  7.) Patient/family education to prevent future falls.  8.) Early mobilization and exercise program - exercise decreases the rate of bone loss and has been associated with decreased rate of fragility fractures   Thank you for the consult and the opportunity to see [name], MD [place] 4:10 PM

## 2014-01-08 NOTE — Op Note (Addendum)
ARTHROPLASTY BIPOLAR HIP  Jackie Phelps [id number]  Pre-op Diagnosis: left femoral neck fracture   Post-op Diagnosis: same  Operative Procedures  1. Open treatment of femoral fracture, proximal end, neck, prosthetic replacement CPT [phone]  Personnel  Surgeon(s): Anabelle Bungert Jackie Roux, MD  Anesthesia: spinal  Prosthesis: Tamala Julian and Nephew Femur: Polarstem Head: 46 size: +0 Bearing Type: bipolar  Date of Service: 01/08/2014  Indication: 81 y.o.. year old female who suffered a ground level fall and was found to have sustained a Left hip fracture. The patient was found to have a hip fracture that was appropriate for operative management. We reviewed the risk and benefits with the patient and family and they elected to proceed.  Procedure: After informed consent was obtained and understanding of the risk were voiced including but not limited to bleeding, infection, damage to surrounding structures including nerves and vessels, blood clots, leg length inequality, dislocation and the failure to achieve desired results, including death, the operative extremity was marked with verbal confirmation of the patient in the holding area.  The patient was then brought to the operating room and transported to the operating room table and placed in the lateral decubitus position. The operative limb was then prepped and draped in the usual sterile fashion and preoperative antibiotics were administered. A time out was performed prior to the start of surgery confirming the correct extremity, preoperative antibiotic administration, as well as team members, and that implants and instruments available for the case. Correct surgical site was also confirmed with preoperative radiographs. A standard posterior approach to the hip was performed. A capsulotomy was performed and the capsule tagged for later repair. The labrum was preserved. The hip was dislocated and the femoral neck cut was made at  approximately 1.5 cm above the level of the lesser trochanter using the femoral neck cutting guide. We then used a corkscrew and cobb to remove the femoral head from the acetabulum. We measured the head and proceeded to trial head sizes and found 46 mm to the the appropriate size. We then turned our attention to femoral preparation. We began sequential broaching to a size 3 stem. This size produced good fit and rotational stability. We placed the trial neck and a 46 mm +0 head. Leg lengths were evaluated on the table. The hip was stable in extension and external rotation without impingement. The hip was stable in deep flexion. In 90 degrees of flexion and neutral abduction the hip was stable to 65 degrees of internal rotation. In a position of sleep with hip adducted across the body the hip was stable to 60 degrees of rotation.  We then turned back to the femur and tested the broach for stability and fit, and removed the trial broach. The final femoral implant was placed, and the trial head was again trialed for stability. We then irrigated and dried the trunion, and the final head was placed. We placed an 46 mm +0 head. The hip was again reduced, and taken through a range of motion and found to be stable as above. The hip was thoroughly irrigated, and a posterior capsular repair was performed with #2 Ethibond. The wound was irrigated with normal saline. The deep fascia was closed with 1 vicryl, 0 vicryl for the deep fat layer, and 2.0 Vicryl Plus for the subcutaneous tissue. The skin was closed with staples. A sterile dressing was applied. The patient was awakened and transported to the recovery room in stable condition. All sponge, needle, and instrument counts  were correct at the end of the case.  Position: lateral decubitus  Complications: none.  Time Out: performed   Drains/Packing: none  Estimated blood loss: 200 cc  Returned to Recovery Room: in good condition.   Antibiotics: yes    Mechanical VTE (DVT) Prophylaxis: sequential compression devices, TED thigh-high  Chemical VTE (DVT) Prophylaxis: lovenox  Fluid Replacement  Crystalloid: see anesthesia record Blood: none  FFP: none   Specimens Removed: 1 to pathology   Sponge and Instrument Count Correct? yes   PACU: portable radiograph - low AP pelvis  Admission: inpatient status, start PT & OT POD#1  Plan/RTC: Return in 2 weeks for wound check.  Weight Bearing/Load Lower Extremity: full  Posterior hip precautions  N. Jackie Roux, MD [place] 4:47 PM

## 2014-01-08 NOTE — Transfer of Care (Signed)
Immediate Anesthesia Transfer of Care Note  Patient: Jackie Phelps  Procedure(s) Performed: Procedure(s): ARTHROPLASTY BIPOLAR HIP (Left)  Patient Location: PACU  Anesthesia Type:MAC and Spinal  Level of Consciousness: awake, oriented, patient cooperative and responds to stimulation  Airway & Oxygen Therapy: Patient Spontanous Breathing  Post-op Assessment: Report given to PACU RN, Post -op Vital signs reviewed and stable and moving upper extremities well  Post vital signs: Reviewed and stable  Complications: No apparent anesthesia complications

## 2014-01-08 NOTE — Consult Note (Deleted)
ARTHROPLASTY BIPOLAR HIP  Jackie Phelps   [id number]  Pre-op Diagnosis: left femoral neck fracture     Post-op Diagnosis: same   Operative Procedures  1. Open treatment of femoral fracture, proximal end, neck, prosthetic replacement CPT [phone]  Personnel  Surgeon(s): Naiping Eduard Roux, MD   Anesthesia: general  Prosthesis: Tamala Julian and Nephew Femur: Polarstem Head: 46 size: +0 Bearing Type: bipolar  Date of Service: 01/08/2014  Indication: 81 y.o.. year old female who suffered a ground level fall and was found to have sustained a Left hip fracture. The patient was found to have a hip fracture that was appropriate for operative management. We reviewed the risk and benefits with the patient and family and they elected to proceed.  Procedure: After informed consent was obtained and understanding of the risk were voiced including but not limited to bleeding, infection, damage to surrounding structures including nerves and vessels, blood clots, leg length inequality, dislocation and the failure to achieve desired results, including death, the operative extremity was marked with verbal confirmation of the patient in the holding area.   The patient was then brought to the operating room and transported to the operating room table and placed in the lateral decubitus position. The operative limb was then prepped and draped in the usual sterile fashion and preoperative antibiotics were administered.  A time out was performed prior to the start of surgery confirming the correct extremity, preoperative antibiotic administration, as well as team members, and that implants and instruments available for the case. Correct surgical site was also confirmed with preoperative radiographs. A standard posterior approach to the hip was performed. A capsulotomy was performed and the capsule tagged for later repair. The labrum was preserved. The hip was dislocated and the femoral neck cut was made at  approximately 1.5 cm above the level of the lesser trochanter using the femoral neck cutting guide. We then used a corkscrew and cobb to remove the femoral head from the acetabulum. We measured the head and proceeded to trial head sizes and found 46 mm to the the appropriate size. We then turned our attention to femoral preparation. We began sequential broaching to a size 3 stem. This size produced good fit and rotational stability. We placed the trial neck and a 46 mm +0 head.  Leg lengths were evaluated on the table. The hip was stable in extension and external rotation without impingement. The hip was stable in deep flexion. In 90 degrees of flexion and neutral abduction the hip was stable to 65 degrees of internal rotation. In a position of sleep with hip adducted across the body the hip was stable to 60 degrees of rotation.  We then turned back to the femur and tested the broach for stability and fit, and removed the trial broach. The final femoral implant was placed, and the trial head was again trialed for stability. We then irrigated and dried the trunion, and the final head was placed. We placed an 46 mm +0 head. The hip was again reduced, and taken through a range of motion and found to be stable as above. The hip was thoroughly irrigated, and a posterior capsular repair was performed with #2 Ethibond. The wound was irrigated with normal saline. The deep fascia was closed with 1 vicryl, 0 vicryl for the deep fat layer, and 2.0 Vicryl Plus for the subcutaneous tissue. The skin was closed with staples. A sterile dressing was applied. The patient was awakened and transported to the recovery room  in stable condition. All sponge, needle, and instrument counts were correct at the end of the case.  Position: lateral decubitus   Complications: none.  Time Out: performed   Drains/Packing: none  Estimated blood loss: 200 cc  Returned to Recovery Room: in good condition.   Antibiotics: yes    Mechanical VTE (DVT) Prophylaxis: sequential compression devices, TED thigh-high  Chemical VTE (DVT) Prophylaxis: lovenox  Fluid Replacement  Crystalloid: see anesthesia record Blood: none  FFP: none   Specimens Removed: 1 to pathology   Sponge and Instrument Count Correct? yes   PACU: portable radiograph - low AP pelvis  Admission: inpatient status, start PT & OT POD#1  Plan/RTC: Return in 2 weeks for wound check.  Weight Bearing/Load Lower Extremity: full  Posterior hip precautions  N. Eduard Roux, MD [place] 4:47 PM

## 2014-01-08 NOTE — ED Notes (Signed)
Mechanical fall; tripped over a rack in the floor; fell onto concrete floor, landing on left hip. No LOC. Left hip pain. Given 4mg  Morphine in route.

## 2014-01-08 NOTE — ED Notes (Signed)
Patient returned from X-ray 

## 2014-01-08 NOTE — Discharge Instructions (Signed)
° ° °  1. Change dressings as needed °2. May shower but keep incisions covered and dry °3. Take lovenox to prevent blood clots °4. Take stool softeners as needed °5. Take pain meds as needed ° °

## 2014-01-08 NOTE — Anesthesia Procedure Notes (Signed)
Spinal Patient location during procedure: OR Start time: 01/08/2014 5:05 PM End time: 01/08/2014 5:10 PM Staffing Performed by: anesthesiologist  Preanesthetic Checklist Completed: patient identified, site marked, surgical consent, pre-op evaluation, timeout performed, IV checked, risks and benefits discussed and monitors and equipment checked Spinal Block Patient position: left lateral decubitus Prep: Betadine Patient monitoring: cardiac monitor, blood pressure, continuous pulse ox and heart rate Location: L3-4 Injection technique: single-shot Needle Needle type: Tuohy  Needle gauge: 22 G Needle length: 9 cm Assessment Sensory level: T8 Additional Notes 7.5 mg 0.75% Marcaine injected easily

## 2014-01-08 NOTE — H&P (Signed)
PATIENT DETAILS Name: Jackie Phelps Age: 81 y.o. Sex: female Date of Birth: 05/25/1931 Admit Date: 01/08/2014 IDP:[id number] C, MD  CHIEF COMPLAINT:  Fall  HPI: Jackie Phelps is a 81 y.o. female with a Past Medical History of hypertension, dyslipidemia who presents today with the above noted complaint. Per patient, she was in the basement of her house when she caught the corner of a cement block and fell. She did not lose consciousness. She subsequently started having severe left hip pain on movement. She was subsequently brought to the emergency room, where an x-ray of the left hip showed a moderately displaced and angulated proximal left femoral neck fracture. I was subsequently asked to admit this patient for further evaluation and treatment. This patient is very active, she is independent of all activities of daily living, claims she does all her household chores by himself. She can take two grocery bags and climb a few flight of stairs. There is no history of syncope, chest pain, headache, nausea, vomiting or diarrhea.   ALLERGIES:   Allergies  Allergen Reactions  . Penicillins Rash    PAST MEDICAL HISTORY: Past Medical History  Diagnosis Date  . Hypertension   . Hypercholesteremia   . Anxiety   . Constipation     PAST SURGICAL HISTORY: Past Surgical History  Procedure Laterality Date  . Colonoscopy    . Colonoscopy  03/05/2011    Procedure: COLONOSCOPY;  Surgeon: Rogene Houston, MD;  Location: [facility];  Service: Endoscopy;  Laterality: N/A;  1200    MEDICATIONS AT HOME: Prior to Admission medications   Medication Sig Start Date End Date Taking? Authorizing Provider  aspirin 81 MG tablet Take 81 mg by mouth daily. Twice a day    Historical Provider, MD  carvedilol (COREG) 6.25 MG tablet Take 12.5 mg by mouth 2 (two) times daily with a meal.     Historical Provider, MD  diazepam (VALIUM) 5 MG tablet Take 5 mg by mouth as needed.    Historical  Provider, MD  escitalopram (LEXAPRO) 5 MG tablet Take 5 mg by mouth daily. 12/07/13   Historical Provider, MD  losartan (COZAAR) 25 MG tablet Take 25 mg by mouth daily. 11/03/13   Historical Provider, MD  losartan (COZAAR) 50 MG tablet Take 50 mg by mouth daily.    Historical Provider, MD  multivitamin-lutein [facility]) CAPS Take 1 capsule by mouth daily.    Historical Provider, MD  simvastatin (ZOCOR) 20 MG tablet Take 20 mg by mouth daily.    Historical Provider, MD  sulfamethoxazole-trimethoprim (BACTRIM DS) 800-160 MG per tablet Take 1 tablet by mouth 2 (two) times daily. 10/15/13   Evalee Jefferson, PA-C    FAMILY HISTORY: Family History  Problem Relation Age of Onset  . Hypertension Mother   . Healthy Daughter   . Colon cancer Neg Hx     SOCIAL HISTORY:  reports that she quit smoking about 47 years ago. Her smoking use included Cigarettes. She smoked 0.00 packs per day. She has never used smokeless tobacco. She reports that she drinks about 1.2 oz of alcohol per week. She reports that she does not use illicit drugs.  REVIEW OF SYSTEMS:  Constitutional:   No  weight loss, night sweats,  Fevers, chills, fatigue.  HEENT:    No headaches, Difficulty swallowing,Tooth/dental problems,Sore throat  Cardio-vascular: No chest pain,  Orthopnea, PND, swelling in lower extremities, anasarca,  dizziness, palpitations  GI:  No heartburn,  indigestion, abdominal pain, nausea, vomiting, diarrhea, change in bowel habits, loss of appetite  Resp: No shortness of breath with exertion or at rest.  No excess mucus, no productive cough, No non-productive cough  Skin:  no rash or lesions.  GU:  no dysuria, change in color of urine, no urgency or frequency.  No flank pain.  Musculoskeletal: No joint pain or swelling.  No decreased range of motion.  No back pain.  Psych: No change in mood or affect. No depression or anxiety.  No memory loss.   PHYSICAL EXAM: Blood pressure 195/76, pulse  71, temperature 97.7 F (36.5 C), temperature source Oral, resp. rate 13, SpO2 100 %.  General appearance :Awake, alert, not in any distress. Speech Clear. Not toxic Looking HEENT: Atraumatic and Normocephalic, pupils equally reactive to light and accomodation Neck: supple, no JVD. No cervical lymphadenopathy.  Chest:Good air entry bilaterally, no added sounds  CVS: S1 S2 regular, no murmurs.  Abdomen: Bowel sounds present, Non tender and not distended with no gaurding, rigidity or rebound. Extremities: B/L Lower Ext shows no edema, both legs are warm to touch Neurology: Awake alert, and oriented X 3, CN II-XII intact, Non focal Skin:No Rash Wounds:N/A  LABS ON ADMISSION:   Recent Labs  01/08/14 1306  NA 139  K 3.8  CL 105  CO2 24  GLUCOSE 143*  BUN 19  CREATININE 0.86  CALCIUM 9.4   No results for input(s): AST, ALT, ALKPHOS, BILITOT, PROT, ALBUMIN in the last 72 hours. No results for input(s): LIPASE, AMYLASE in the last 72 hours.  Recent Labs  01/08/14 1306  WBC 10.5  HGB 12.6  HCT 38.7  MCV 89.4  PLT 173   No results for input(s): CKTOTAL, CKMB, CKMBINDEX, TROPONINI in the last 72 hours. No results for input(s): DDIMER in the last 72 hours. Invalid input(s): POCBNP   RADIOLOGIC STUDIES ON ADMISSION: Dg Chest 1 View  01/08/2014   CLINICAL DATA:  Acute left hip fracture.  EXAM: CHEST - 1 VIEW  COMPARISON:  None.  FINDINGS: The heart size and mediastinal contours are normal. There is no evidence of pulmonary edema, consolidation, pneumothorax, nodule or pleural fluid. No fractures identified.  IMPRESSION: No active disease in the chest.   Electronically Signed   By: Aletta Edouard M.D.   On: 01/08/2014 13:56   Dg Hip Complete Left  01/08/2014   CLINICAL DATA:  Acute left hip pain after fall today.  EXAM: LEFT HIP - COMPLETE 2+ VIEW  COMPARISON:  None.  FINDINGS: Moderately displaced and angulated fracture of the proximal left femoral neck is noted. Femoral head is  situated within the acetabulum. This appears to be closed and posttraumatic.  IMPRESSION: Moderately displaced and angulated proximal left femoral neck fracture. This is the initial encounter.   Electronically Signed   By: Sabino Dick M.D.   On: 01/08/2014 13:57   EKG: Independently reviewed. NSR  ASSESSMENT AND PLAN: Present on Admission:  . Left displaced femoral neck fracture: Admit to medical surgical unit, await orthopedics evaluation. Keep nothing by mouth. Moderate risk for proposed surgical procedure, okay to proceed with surgery without any prior workup. Patient very functional, she is aware of risks and is accepting of the risk.   Marland Kitchen Hypertension: Continue with losartan and Coreg. Add hydralazine if SBP more than 180. follow and titrate medications accordingly.   Marland Kitchen Dyslipidemia: Continue statins.  Further plan will depend as patient's clinical course evolves and further radiologic and laboratory data become available.  Patient will be monitored closely.  Above noted plan was discussed with patient, she was in agreement.   DVT Prophylaxis: Prophylactic Lovenox  Code Status: Full Code  Disposition Plan: SNF on discharge    Total time spent for admission equals 45 minutes.  [facility] Pager [phone]  If 7PM-7AM, please contact night-coverage [email or web address] Password [email or web address] 01/08/2014, 3:33 PM

## 2014-01-08 NOTE — ED Provider Notes (Signed)
CSN: [id number]     Arrival date & time 01/08/14  1229 History   First MD Initiated Contact with Patient 01/08/14 1238     Chief Complaint  Patient presents with  . Fall  . Hip Pain     (Consider location/radiation/quality/duration/timing/severity/associated sxs/prior Treatment) HPI Comments: Patient was in her basement and caught the corner of a cement block and fell. Did not hit her head or lose consciousness. She fell on her left hip. She's now experiencing severe left hip pain when moving it.  Patient is a 81 y.o. female presenting with fall and hip pain.  Fall This is a new problem. The current episode started 1 to 2 hours ago. Episode frequency: once. The problem has not changed since onset.Pertinent negatives include no chest pain, no abdominal pain and no shortness of breath. Nothing aggravates the symptoms. Nothing relieves the symptoms.  Hip Pain Pertinent negatives include no chest pain, no abdominal pain and no shortness of breath.    Past Medical History  Diagnosis Date  . Hypertension   . Hypercholesteremia   . Anxiety   . Constipation    Past Surgical History  Procedure Laterality Date  . Colonoscopy    . Colonoscopy  03/05/2011    Procedure: COLONOSCOPY;  Surgeon: Rogene Houston, MD;  Location: [facility];  Service: Endoscopy;  Laterality: N/A;  1200   Family History  Problem Relation Age of Onset  . Hypertension Mother   . Healthy Daughter   . Colon cancer Neg Hx    History  Substance Use Topics  . Smoking status: Former Smoker    Types: Cigarettes    Quit date: 02/02/1966  . Smokeless tobacco: Never Used  . Alcohol Use: 1.2 oz/week    2 Glasses of wine per week     Comment: Drinks wine   OB History    No data available     Review of Systems  Constitutional: Negative for fever and chills.  Respiratory: Negative for cough and shortness of breath.   Cardiovascular: Negative for chest pain.  Gastrointestinal: Negative for vomiting and abdominal  pain.  All other systems reviewed and are negative.     Allergies  Penicillins  Home Medications   Prior to Admission medications   Medication Sig Start Date End Date Taking? Authorizing Provider  aspirin 81 MG tablet Take 81 mg by mouth daily. Twice a day    Historical Provider, MD  carvedilol (COREG) 6.25 MG tablet Take 12.5 mg by mouth 2 (two) times daily with a meal.     Historical Provider, MD  diazepam (VALIUM) 5 MG tablet Take 5 mg by mouth as needed.    Historical Provider, MD  losartan (COZAAR) 50 MG tablet Take 50 mg by mouth daily.    Historical Provider, MD  multivitamin-lutein [facility]) CAPS Take 1 capsule by mouth daily.    Historical Provider, MD  simvastatin (ZOCOR) 20 MG tablet Take 20 mg by mouth daily.    Historical Provider, MD  sulfamethoxazole-trimethoprim (BACTRIM DS) 800-160 MG per tablet Take 1 tablet by mouth 2 (two) times daily. 10/15/13   Evalee Jefferson, PA-C   BP 174/63 mmHg  Pulse 56  Temp(Src) 97.7 F (36.5 C) (Oral)  Resp 15  SpO2 100% Physical Exam  Constitutional: She is oriented to person, place, and time. She appears well-developed and well-nourished. No distress.  HENT:  Head: Normocephalic and atraumatic.  Mouth/Throat: Oropharynx is clear and moist.  Eyes: EOM are normal. Pupils are equal, round,  and reactive to light.  Neck: Normal range of motion. Neck supple.  Cardiovascular: Normal rate and regular rhythm.  Exam reveals no friction rub.   No murmur heard. Pulmonary/Chest: Effort normal and breath sounds normal. No respiratory distress. She has no wheezes. She has no rales.  Abdominal: Soft. She exhibits no distension. There is no tenderness. There is no rebound.  Musculoskeletal: She exhibits no edema.       Left hip: She exhibits decreased range of motion, tenderness and bony tenderness.  Left leg neurovascular intact distally  Neurological: She is alert and oriented to person, place, and time.  Skin: She is not diaphoretic.   Nursing note and vitals reviewed.   ED Course  Procedures (including critical care time) Labs Review Labs Reviewed  CBC  BASIC METABOLIC PANEL  PROTIME-INR    Imaging Review Dg Chest 1 View  01/08/2014   CLINICAL DATA:  Acute left hip fracture.  EXAM: CHEST - 1 VIEW  COMPARISON:  None.  FINDINGS: The heart size and mediastinal contours are normal. There is no evidence of pulmonary edema, consolidation, pneumothorax, nodule or pleural fluid. No fractures identified.  IMPRESSION: No active disease in the chest.   Electronically Signed   By: Aletta Edouard M.D.   On: 01/08/2014 13:56   Dg Hip Complete Left  01/08/2014   CLINICAL DATA:  Acute left hip pain after fall today.  EXAM: LEFT HIP - COMPLETE 2+ VIEW  COMPARISON:  None.  FINDINGS: Moderately displaced and angulated fracture of the proximal left femoral neck is noted. Femoral head is situated within the acetabulum. This appears to be closed and posttraumatic.  IMPRESSION: Moderately displaced and angulated proximal left femoral neck fracture. This is the initial encounter.   Electronically Signed   By: Sabino Dick M.D.   On: 01/08/2014 13:57     EKG Interpretation   Date/Time:  Sunday January 08 2014 15:10:34 EST Ventricular Rate:  74 PR Interval:  152 QRS Duration: 83 QT Interval:  421 QTC Calculation: 467 R Axis:   72 Text Interpretation:  Sinus rhythm No previous Confirmed by Mingo Amber  MD,  Bessie Boyte ([id number]) on 01/08/2014 3:23:35 PM      MDM   Final diagnoses:  Hip pain  Left displaced femoral neck fracture, closed, initial encounter    81 year old female here with a fall. Concern for left hip dislocation versus fracture. Will x-ray. She has pulses in the leg and can move it, anticipating severe pain. No head injury. L hip xray shows L femoral neck fracture. Dr. Erlinda Hong with Ortho consulted, will operate today. Dr. Sloan Leiter with hospitalists are admitting.  I have reviewed all labs and imaging and considered them in my medical  decision making.   Evelina Bucy, MD 01/08/14 [phone]

## 2014-01-08 NOTE — Anesthesia Postprocedure Evaluation (Signed)
  Anesthesia Post-op Note  Patient: Jackie Phelps  Procedure(s) Performed: Procedure(s): ARTHROPLASTY BIPOLAR HIP (Left)  Patient Location: PACU  Anesthesia Type:MAC and Spinal  Level of Consciousness: awake, alert  and oriented  Airway and Oxygen Therapy: Patient Spontanous Breathing and Patient connected to nasal cannula oxygen  Post-op Pain: mild  Post-op Assessment: Post-op Vital signs reviewed, Patient's Cardiovascular Status Stable, Respiratory Function Stable, Patent Airway, No signs of Nausea or vomiting and Pain level controlled  Post-op Vital Signs: stable  Last Vitals:  Filed Vitals:   01/08/14 2016  BP: 162/104  Pulse: 73  Temp: 36.6 C  Resp: 18    Complications: No apparent anesthesia complications

## 2014-01-09 ENCOUNTER — Encounter (HOSPITAL_COMMUNITY): Payer: Self-pay | Admitting: *Deleted

## 2014-01-09 DIAGNOSIS — D62 Acute posthemorrhagic anemia: Secondary | ICD-10-CM

## 2014-01-09 DIAGNOSIS — K5900 Constipation, unspecified: Secondary | ICD-10-CM

## 2014-01-09 LAB — BASIC METABOLIC PANEL
Anion gap: 4 — ABNORMAL LOW (ref 5–15)
BUN: 15 mg/dL (ref 6–23)
CALCIUM: 8.2 mg/dL — AB (ref 8.4–10.5)
CO2: 28 mmol/L (ref 19–32)
CREATININE: 0.86 mg/dL (ref 0.50–1.10)
Chloride: 107 mEq/L (ref 96–112)
GFR, EST AFRICAN AMERICAN: 72 mL/min — AB (ref >90)
GFR, EST NON AFRICAN AMERICAN: 62 mL/min — AB (ref >90)
Glucose, Bld: 121 mg/dL — ABNORMAL HIGH (ref 70–99)
POTASSIUM: 4.1 mmol/L (ref 3.5–5.1)
Sodium: 139 mmol/L (ref 135–145)

## 2014-01-09 LAB — ABO/RH: ABO/RH(D): A POS

## 2014-01-09 LAB — CBC
HCT: 30.4 % — ABNORMAL LOW (ref 36.0–46.0)
HEMOGLOBIN: 9.9 g/dL — AB (ref 12.0–15.0)
MCH: 29.2 pg (ref 26.0–34.0)
MCHC: 32.6 g/dL (ref 30.0–36.0)
MCV: 89.7 fL (ref 78.0–100.0)
Platelets: 139 10*3/uL — ABNORMAL LOW (ref 150–400)
RBC: 3.39 MIL/uL — ABNORMAL LOW (ref 3.87–5.11)
RDW: 12.7 % (ref 11.5–15.5)
WBC: 7.8 10*3/uL (ref 4.0–10.5)

## 2014-01-09 MED ORDER — POLYETHYLENE GLYCOL 3350 17 G PO PACK
17.0000 g | PACK | Freq: Every day | ORAL | Status: DC
  Administered 2014-01-09 – 2014-01-12 (×4): 17 g via ORAL
  Filled 2014-01-09 (×4): qty 1

## 2014-01-09 MED ORDER — ENSURE COMPLETE PO LIQD
237.0000 mL | Freq: Three times a day (TID) | ORAL | Status: DC
  Administered 2014-01-09 – 2014-01-11 (×5): 237 mL via ORAL

## 2014-01-09 NOTE — Evaluation (Signed)
Physical Therapy Evaluation Patient Details Name: Jackie Phelps MRN: [id number] DOB: 09-18-1931 Today's Date: 01/09/2014   History of Present Illness  Pt is an 81 yo female admitted 01/08/14 for Left displaced femoral neck fracture:secondary to mechanical fall. Underwent Open treatment of femoral fracture, proximal end, neck, prosthetic replacement on 01/08/14. Pt now L LE WBAT and post hip prec.  Clinical Impression  Pt admitted with above. Pt in 10/10 L hip pain and extremely anxious due to the pain requiring max encouragement to transfer to the chair. Pt currently requiring maxAx2 for all mobility. Strongly recommend ST-SNF upon d/c to achieve mod I level of funciton for safe transition home with spouse.    Follow Up Recommendations Supervision/Assistance - 24 hour;SNF    Equipment Recommendations  Rolling walker with 5" wheels    Recommendations for Other Services       Precautions / Restrictions Precautions Precautions: Fall;Posterior Hip Precaution Booklet Issued: Yes (comment) Precaution Comments: pt and dtr educated, dtr with verbal understanding Restrictions Weight Bearing Restrictions: No      Mobility  Bed Mobility Overal bed mobility: Needs Assistance;+2 for physical assistance Bed Mobility: Supine to Sit     Supine to sit: Max assist;+2 for physical assistance;Mod assist     General bed mobility comments: max directional cues, max A for L LE management and trunk elevation  Transfers Overall transfer level: Needs assistance Equipment used:  (2 person lift from front) Transfers: Sit to/from [facility] Sit to Stand: Max assist;+2 physical assistance Stand pivot transfers: Max assist;+2 physical assistance       General transfer comment: max directional v/c's, pt very anxious and reluctant to move due to pain however with max encouragment pt able to stand pvt on R LE. Pt unable to tolerate L LE WBing  Ambulation/Gait Ambulation/Gait  assistance:  (unable to this date)              Financial trader Rankin (Stroke Patients Only)       Balance Overall balance assessment: Needs assistance Sitting-balance support: Bilateral upper extremity supported;Feet supported Sitting balance-Leahy Scale: Poor   Postural control: Posterior lean Standing balance support: Bilateral upper extremity supported Standing balance-Leahy Scale: Zero Standing balance comment: limited by L LE pain                             Pertinent Vitals/Pain Pain Assessment: 0-10 Pain Score: 10-Worst pain ever Pain Location: L hip Pain Descriptors / Indicators: Aching;Burning;Constant Pain Intervention(s): Patient requesting pain meds-RN notified    Home Living Family/patient expects to be discharged to:: Skilled nursing facility Living Arrangements: Spouse/significant other                    Prior Function Level of Independence: Independent               Hand Dominance   Dominant Hand: Right    Extremity/Trunk Assessment   Upper Extremity Assessment: Overall WFL for tasks assessed           Lower Extremity Assessment: LLE deficits/detail   LLE Deficits / Details: minimal mvmt due to pain, knee ROM WFL as noted sitting EOB, pt able to initiate quad set and glut set  Cervical / Trunk Assessment: Normal  Communication   Communication: No difficulties  Cognition Arousal/Alertness: Awake/alert Behavior During Therapy: Anxious Overall Cognitive Status:  Within Functional Limits for tasks assessed                      General Comments General comments (skin integrity, edema, etc.): pt extremely anxious t/o session requiring freq cues for deep breathing relaxation and freq words of encouragement    Exercises Total Joint Exercises Ankle Circles/Pumps: AROM;Both;10 reps;Supine Quad Sets: AROM;10 reps;Left;Supine Gluteal Sets: AROM;10 reps;Supine       Assessment/Plan    PT Assessment Patient needs continued PT services  PT Diagnosis Difficulty walking;Generalized weakness;Acute pain   PT Problem List Decreased strength;Decreased range of motion;Decreased balance;Decreased activity tolerance;Decreased mobility;Decreased knowledge of use of DME;Decreased knowledge of precautions;Pain  PT Treatment Interventions DME instruction;Gait training;Functional mobility training;Therapeutic activities;Therapeutic exercise;Balance training;Patient/family education   PT Goals (Current goals can be found in the Care Plan section) Acute Rehab PT Goals Patient Stated Goal: stop hurting PT Goal Formulation: With patient/family Time For Goal Achievement: 01/23/14 Potential to Achieve Goals: Good    Frequency 7X/week   Barriers to discharge Decreased caregiver support      Co-evaluation               End of Session Equipment Utilized During Treatment: Gait belt Activity Tolerance: Patient limited by pain Patient left: in chair;with call bell/phone within reach;with family/visitor present Nurse Communication: Mobility status;Patient requests pain meds         Time: 7001-7494 PT Time Calculation (min) (ACUTE ONLY): 30 min   Charges:   PT Evaluation $Initial PT Evaluation Tier I: 1 Procedure PT Treatments $Therapeutic Activity: 8-22 mins   PT G Codes:        Kingsley Callander 01/09/2014, 4:19 PM  Kittie Plater, PT, DPT Pager #: [phone] Office #: [phone]

## 2014-01-09 NOTE — Progress Notes (Signed)
Patient Jackie Phelps was pulled at [place] today, and she is due to void. A bladder scan was done at 1732 and there was 203 cc of urinal retention. Will keep monitor and perform a bladder scan at two hours later, report was given to night shift RN.

## 2014-01-09 NOTE — Progress Notes (Signed)
PATIENT DETAILS Name: Jackie Phelps Age: 81 y.o. Sex: female Date of Birth: 08/14/1931 Admit Date: 01/08/2014 Admitting Physician Evalee Mutton Kristeen Mans, MD IOX:[id number],MEQAST C, MD  Subjective: Pain the operative site, still upset that she fell and broke her hip. No other complaints.  Assessment/Plan: Principal Problem:   Left displaced femoral neck fracture:secondary to mechanical fall. Underwent Open treatment of femoral fracture, proximal end, neck, prosthetic replacement on 01/08/14. Await PT eval, Suspect will need SNF on discharge. Continue supportive care. On Lovenox for DVT prophylaxis.  Active Problems:   Anemia:from perioperative blood loss. Monitor and transfuse prn.    Hypertension: Continue with losartan and Coreg    Chronic constipation: on bowel regimen with senokot and Miralax. Follow    Dyslipidemia: Continue statins.  Disposition: Remain inpatient  Antibiotics:  See below.   Anti-infectives    Start     Dose/Rate Route Frequency Ordered Stop   01/08/14 2200  clindamycin (CLEOCIN) IVPB 600 mg     600 mg100 mL/hr over 30 Minutes Intravenous Every 6 hours 01/08/14 2057 01/09/14 0726   01/08/14 1637  clindamycin (CLEOCIN) 600 MG/50ML IVPB    Comments:  Marinda Elk   : cabinet override      01/08/14 1637 01/08/14 1710      DVT Prophylaxis: Prophylactic Lovenox   Code Status: Full code   Family Communication None at bedside-patient awake/alert and understanding of treatment plan  Procedures: Open treatment of femoral fracture, proximal end, neck, prosthetic replacement on 01/08/14.  CONSULTS:  orthopedic surgery  Time spent 40 minutes-which includes 50% of the time with face-to-face with patient/ family and coordinating care related to the above assessment and plan.  MEDICATIONS: Scheduled Meds: . aspirin  81 mg Oral Daily  . carvedilol  12.5 mg Oral BID WC  . enoxaparin (LOVENOX) injection  40 mg Subcutaneous Q24H  . escitalopram  5  mg Oral Daily  . losartan  50 mg Oral Daily  . senna  1 tablet Oral BID  . simvastatin  20 mg Oral Daily   Continuous Infusions: . sodium chloride 125 mL/hr at 01/09/14 0421   PRN Meds:.acetaminophen **OR** acetaminophen, alum & mag hydroxide-simeth, diazepam, hydrALAZINE, hydrALAZINE, HYDROcodone-acetaminophen, ketorolac, menthol-cetylpyridinium **OR** phenol, methocarbamol **OR** methocarbamol (ROBAXIN)  IV, metoCLOPramide **OR** metoCLOPramide (REGLAN) injection, morphine injection, ondansetron **OR** ondansetron (ZOFRAN) IV, oxyCODONE, polyethylene glycol, sorbitol    PHYSICAL EXAM: Vital signs in last 24 hours: Filed Vitals:   01/08/14 2016 01/09/14 0000 01/09/14 0300 01/09/14 0609  BP: 162/104  135/58 123/47  Pulse: 73  74 75  Temp: 97.8 F (36.6 C)  98.1 F (36.7 C) 98.4 F (36.9 C)  TempSrc: Oral  Oral Oral  Resp: 18 18 18 18   Height: 5\' 3"  (1.6 m)     Weight: 54.432 kg (120 lb)     SpO2: 100% 100% 100% 100%    Weight change:  Filed Weights   01/08/14 2016  Weight: 54.432 kg (120 lb)   Body mass index is 21.26 kg/(m^2).   Gen Exam: Awake and alert with clear speech.   Neck: Supple, No JVD.   Chest: B/L Clear.   CVS: S1 S2 Regular, no murmurs.  Abdomen: soft, BS +, non tender, non distended.  Extremities: no edema, lower extremities warm to touch. Neurologic: Non Focal.   Skin: No Rash.   Wounds: N/A.   Intake/Output from previous day:  Intake/Output Summary (Last 24 hours) at 01/09/14 1030 Last data filed at 01/09/14 [phone]  Gross per 24 hour  Intake   1480 ml  Output   1735 ml  Net   -255 ml     LAB RESULTS: CBC  Recent Labs Lab 01/08/14 1306 01/09/14 0615  WBC 10.5 7.8  HGB 12.6 9.9*  HCT 38.7 30.4*  PLT 173 139*  MCV 89.4 89.7  MCH 29.1 29.2  MCHC 32.6 32.6  RDW 12.7 12.7    Chemistries   Recent Labs Lab 01/08/14 1306 01/09/14 0615  NA 139 139  K 3.8 4.1  CL 105 107  CO2 24 28  GLUCOSE 143* 121*  BUN 19 15  CREATININE 0.86  0.86  CALCIUM 9.4 8.2*    CBG: No results for input(s): GLUCAP in the last 168 hours.  GFR Estimated Creatinine Clearance: 43.2 mL/min (by C-G formula based on Cr of 0.86).  Coagulation profile No results for input(s): INR, PROTIME in the last 168 hours.  Cardiac Enzymes No results for input(s): CKMB, TROPONINI, MYOGLOBIN in the last 168 hours.  Invalid input(s): CK  Invalid input(s): POCBNP No results for input(s): DDIMER in the last 72 hours. No results for input(s): HGBA1C in the last 72 hours. No results for input(s): CHOL, HDL, LDLCALC, TRIG, CHOLHDL, LDLDIRECT in the last 72 hours. No results for input(s): TSH, T4TOTAL, T3FREE, THYROIDAB in the last 72 hours.  Invalid input(s): FREET3 No results for input(s): VITAMINB12, FOLATE, FERRITIN, TIBC, IRON, RETICCTPCT in the last 72 hours. No results for input(s): LIPASE, AMYLASE in the last 72 hours.  Urine Studies No results for input(s): UHGB, CRYS in the last 72 hours.  Invalid input(s): UACOL, UAPR, USPG, UPH, UTP, UGL, UKET, UBIL, UNIT, UROB, ULEU, UEPI, UWBC, URBC, UBAC, CAST, UCOM, BILUA  MICROBIOLOGY: No results found for this or any previous visit (from the past 240 hour(s)).  RADIOLOGY STUDIES/RESULTS: Dg Chest 1 View  01/08/2014   CLINICAL DATA:  Acute left hip fracture.  EXAM: CHEST - 1 VIEW  COMPARISON:  None.  FINDINGS: The heart size and mediastinal contours are normal. There is no evidence of pulmonary edema, consolidation, pneumothorax, nodule or pleural fluid. No fractures identified.  IMPRESSION: No active disease in the chest.   Electronically Signed   By: Aletta Edouard M.D.   On: 01/08/2014 13:56   Dg Hip Complete Left  01/08/2014   CLINICAL DATA:  Acute left hip pain after fall today.  EXAM: LEFT HIP - COMPLETE 2+ VIEW  COMPARISON:  None.  FINDINGS: Moderately displaced and angulated fracture of the proximal left femoral neck is noted. Femoral head is situated within the acetabulum. This appears to be  closed and posttraumatic.  IMPRESSION: Moderately displaced and angulated proximal left femoral neck fracture. This is the initial encounter.   Electronically Signed   By: Sabino Dick M.D.   On: 01/08/2014 13:57   Pelvis Portable  01/08/2014   CLINICAL DATA:  Postoperative evaluation of left hip joint replacement  EXAM: PORTABLE PELVIS 1-2 VIEWS  COMPARISON:  None.  FINDINGS: Unipolar left hip prosthesis with components in anticipated position. Surgical staples over the greater trochanter with small volume of air in the soft tissues consistent with postoperative change. Pelvic bones intact otherwise.  IMPRESSION: Anticipated postoperative appearance.   Electronically Signed   By: Skipper Cliche M.D.   On: 01/08/2014 19:29    Oren Binet, MD  [facility] Pager:[phone]  If 7PM-7AM, please contact night-coverage [email or web address] Password [email or web address] 01/09/2014, 10:30 AM   LOS: 1 day

## 2014-01-09 NOTE — Progress Notes (Signed)
Orthopedic Tech Progress Note Patient Details:  Jackie Phelps 02-Nov-1931 [id number]  Patient ID: Jackie Phelps, female   DOB: September 24, 1931, 81 y.o.   MRN: [id number] Pt unable to use trapeze bar patient helper RN notifed  Hildred Priest 01/09/2014, 2:28 PM

## 2014-01-09 NOTE — Progress Notes (Signed)
Utilization review completed. Ancel Easler, RN, BSN. 

## 2014-01-09 NOTE — Progress Notes (Signed)
INITIAL NUTRITION ASSESSMENT  DOCUMENTATION CODES Per approved criteria  -Not Applicable   INTERVENTION: Provide Ensure Complete po TID, each supplement provides 350 kcal and 13 grams of protein.  Encourage PO intake.   NUTRITION DIAGNOSIS: Inadequate oral intake related to decreased appetite as evidenced by pt report and meal completion of 0-25%.   Goal: Pt to meet >/= 90% of their estimated nutrition needs   Monitor:  PO intake, weight trends, labs, I/O's  Reason for Assessment: MD consult for assessment of nutrition requirements/status  81 y.o. female  Admitting Dx: Left displaced femoral neck fracture  ASSESSMENT: Pt with a Past Medical History of hypertension, dyslipidemia who presents with fall. X-ray of the left hip showed a moderately displaced and angulated proximal left femoral neck fracture.  Operative Procedures (1/3): Open treatment of femoral fracture, proximal end, neck, prosthetic replacement  Pt reports having a lack of appetite since admission. Meal completion is 0-25%. She reports PTA she was eating fine with 3 meals a day with no other difficulties. Weight has been stable. Pt is agreeable on Ensure to help with caloric and protein needs. Pt was encouraged to eat her food at meals and to drink her supplements.   Unable to perform nutrition focused physical exam at this time as pt was in pain. Will perform during next visit.   Labs: Low calcium and GFR.  Height: Ht Readings from Last 1 Encounters:  01/08/14 5\' 3"  (1.6 m)    Weight: Wt Readings from Last 1 Encounters:  01/08/14 120 lb (54.432 kg)    Ideal Body Weight: 115 lbs  % Ideal Body Weight: 104%  Wt Readings from Last 10 Encounters:  01/08/14 120 lb (54.432 kg)  08/31/12 116 lb 14.4 oz (53.025 kg)  03/05/11 112 lb (50.803 kg)  02/03/11 112 lb 3.2 oz (50.894 kg)    Usual Body Weight: 120 lbs  % Usual Body Weight: 100%  BMI:  Body mass index is 21.26 kg/(m^2).  Estimated  Nutritional Needs: Kcal: 1700-1900 Protein: 65-80 grams Fluid: 1.7 - 1.9 L/day  Skin: Incision left hip  Diet Order: Diet regular  EDUCATION NEEDS: -No education needs identified at this time   Intake/Output Summary (Last 24 hours) at 01/09/14 0919 Last data filed at 01/09/14 0610  Gross per 24 hour  Intake   1480 ml  Output   1735 ml  Net   -255 ml    Last BM: 1/1  Labs:   Recent Labs Lab 01/08/14 1306 01/09/14 0615  NA 139 139  K 3.8 4.1  CL 105 107  CO2 24 28  BUN 19 15  CREATININE 0.86 0.86  CALCIUM 9.4 8.2*  GLUCOSE 143* 121*    CBG (last 3)  No results for input(s): GLUCAP in the last 72 hours.  Scheduled Meds: . aspirin  81 mg Oral Daily  . carvedilol  12.5 mg Oral BID WC  . enoxaparin (LOVENOX) injection  40 mg Subcutaneous Q24H  . escitalopram  5 mg Oral Daily  . losartan  50 mg Oral Daily  . senna  1 tablet Oral BID  . simvastatin  20 mg Oral Daily    Continuous Infusions: . sodium chloride 125 mL/hr at 01/09/14 0421    Past Medical History  Diagnosis Date  . Hypertension   . Hypercholesteremia   . Anxiety   . Constipation     Past Surgical History  Procedure Laterality Date  . Colonoscopy    . Colonoscopy  03/05/2011    Procedure:  COLONOSCOPY;  Surgeon: Rogene Houston, MD;  Location: [facility];  Service: Endoscopy;  Laterality: N/A;  [place], MS, RD, LDN Pager # [phone] After hours/ weekend pager # [phone]

## 2014-01-10 LAB — CBC
HCT: 24.8 % — ABNORMAL LOW (ref 36.0–46.0)
HCT: 25.8 % — ABNORMAL LOW (ref 36.0–46.0)
HEMOGLOBIN: 8.2 g/dL — AB (ref 12.0–15.0)
Hemoglobin: 8.5 g/dL — ABNORMAL LOW (ref 12.0–15.0)
MCH: 29.4 pg (ref 26.0–34.0)
MCH: 29.5 pg (ref 26.0–34.0)
MCHC: 33.1 g/dL (ref 30.0–36.0)
MCHC: 32.9 g/dL (ref 30.0–36.0)
MCV: 88.9 fL (ref 78.0–100.0)
MCV: 89.6 fL (ref 78.0–100.0)
Platelets: 111 10*3/uL — ABNORMAL LOW (ref 150–400)
Platelets: 124 10*3/uL — ABNORMAL LOW (ref 150–400)
RBC: 2.79 MIL/uL — ABNORMAL LOW (ref 3.87–5.11)
RBC: 2.88 MIL/uL — ABNORMAL LOW (ref 3.87–5.11)
RDW: 12.7 % (ref 11.5–15.5)
RDW: 12.8 % (ref 11.5–15.5)
WBC: 10.3 10*3/uL (ref 4.0–10.5)
WBC: 10.5 10*3/uL (ref 4.0–10.5)

## 2014-01-10 LAB — BASIC METABOLIC PANEL
Anion gap: 6 (ref 5–15)
BUN: 19 mg/dL (ref 6–23)
CALCIUM: 8.2 mg/dL — AB (ref 8.4–10.5)
CO2: 22 mmol/L (ref 19–32)
Chloride: 107 mEq/L (ref 96–112)
Creatinine, Ser: 0.94 mg/dL (ref 0.50–1.10)
GFR calc Af Amer: 65 mL/min — ABNORMAL LOW (ref >90)
GFR, EST NON AFRICAN AMERICAN: 56 mL/min — AB (ref >90)
GLUCOSE: 144 mg/dL — AB (ref 70–99)
Potassium: 3.8 mmol/L (ref 3.5–5.1)
Sodium: 135 mmol/L (ref 135–145)

## 2014-01-10 NOTE — Progress Notes (Signed)
Physical Therapy Treatment Patient Details Name: Jackie Phelps MRN: [id number] DOB: January 25, 1931 Today's Date: 01/10/2014    History of Present Illness Pt is an 81 yo female admitted 01/08/14 for Left displaced femoral neck fracture:secondary to mechanical fall. Underwent Open treatment of femoral fracture, proximal end, neck, prosthetic replacement on 01/08/14. Pt now L LE WBAT and post hip prec.    PT Comments    Pt with significant improvement in transfer and ambulation ability. Pt with decreased pain compared to yesterday but con't to be extremely anxious. Pt demo'd short term memory deficits and was unable to recall PT session yesterday or the precautions/exercises at the end of session today after having been educated earlier in session. Acute PT to con't to follow to progress mobility as able.   Follow Up Recommendations  Supervision/Assistance - 24 hour;SNF     Equipment Recommendations  Rolling walker with 5" wheels    Recommendations for Other Services       Precautions / Restrictions Precautions Precautions: Fall;Posterior Hip Precaution Booklet Issued: Yes (comment) Precaution Comments: pt re-educated on prec due to no recall, pt resting with L LE internally rotated Restrictions Weight Bearing Restrictions: Yes LLE Weight Bearing: Weight bearing as tolerated    Mobility  Bed Mobility               General bed mobility comments: pt up in chair upon PT arrival  Transfers Overall transfer level: Needs assistance Equipment used: Rolling walker (2 wheeled) Transfers: Sit to/from Stand Sit to Stand: Mod assist         General transfer comment: max directional v/c's, max encouragement due to increased fear of pain with mvmt  Ambulation/Gait Ambulation/Gait assistance: Min assist (2nd person for chair follow) Ambulation Distance (Feet): 20 Feet Assistive device: Rolling walker (2 wheeled) Gait Pattern/deviations: Step-to pattern;Decreased stance time -  left Gait velocity: slow   General Gait Details: max directional v/c's for sequencing t/o session, significant increase in time   Stairs            Wheelchair Mobility    Modified Rankin (Stroke Patients Only)       Balance                                    Cognition Arousal/Alertness: Awake/alert Behavior During Therapy: WFL for tasks assessed/performed         Memory: Decreased short-term memory (no recall of ther ex or precautions, hand out given)              Exercises Total Joint Exercises Ankle Circles/Pumps: AROM;Both;10 reps;Supine Quad Sets: AROM;10 reps;Left Gluteal Sets: AROM;10 reps Heel Slides: AAROM;Left;10 reps Long Arc Quad: AROM;10 reps;Left    General Comments        Pertinent Vitals/Pain Pain Assessment: 0-10 Pain Score: 4  Pain Descriptors / Indicators: Aching;Constant Pain Intervention(s): Monitored during session    Home Living                      Prior Function            PT Goals (current goals can now be found in the care plan section) Progress towards PT goals: Progressing toward goals    Frequency  Min 5X/week    PT Plan Current plan remains appropriate;Frequency needs to be updated    Co-evaluation             End  of Session Equipment Utilized During Treatment: Gait belt Activity Tolerance: Patient tolerated treatment well Patient left: in chair;with call bell/phone within reach;with family/visitor present     Time: 7282-0601 PT Time Calculation (min) (ACUTE ONLY): 35 min  Charges:  $Gait Training: 8-22 mins $Therapeutic Exercise: 8-22 mins                    G Codes:      Kingsley Callander 01/10/2014, 12:45 PM   Kittie Plater, PT, DPT Pager #: [phone] Office #: [phone]

## 2014-01-10 NOTE — Progress Notes (Signed)
PATIENT DETAILS Name: Jackie Phelps Age: 81 y.o. Sex: female Date of Birth: Aug 16, 1931 Admit Date: 01/08/2014 Admitting Physician Evalee Mutton Kristeen Mans, MD YCX:[id number],[id number] C, MD  Subjective:  No major complaints.  Assessment/Plan: Principal Problem:   Left displaced femoral neck fracture:secondary to mechanical fall. Underwent Open treatment of femoral fracture, proximal end, neck, prosthetic replacement on 01/08/14. Await PT eval, Suspect will need SNF on discharge. Continue supportive care. On Lovenox for DVT prophylaxis.  Active Problems:   Anemia:from perioperative blood loss. Monitor and transfuse prn.Check CBC later today    Hypertension: Continue with losartan and Coreg    Chronic constipation: on bowel regimen with senokot and Miralax. Follow    Dyslipidemia: Continue statins.  Disposition: Remain inpatient-SNF 1/6 or 1/7  Antibiotics:  See below.   Anti-infectives    Start     Dose/Rate Route Frequency Ordered Stop   01/08/14 2200  clindamycin (CLEOCIN) IVPB 600 mg     600 mg100 mL/hr over 30 Minutes Intravenous Every 6 hours 01/08/14 2057 01/09/14 0726   01/08/14 1637  clindamycin (CLEOCIN) 600 MG/50ML IVPB    Comments:  Marinda Elk   : cabinet override      01/08/14 1637 01/08/14 1710      DVT Prophylaxis: Prophylactic Lovenox   Code Status: Full code   Family Communication None at bedside-patient awake/alert and understanding of treatment plan  Procedures: Open treatment of femoral fracture, proximal end, neck, prosthetic replacement on 01/08/14.  CONSULTS:  orthopedic surgery   MEDICATIONS: Scheduled Meds: . aspirin  81 mg Oral Daily  . carvedilol  12.5 mg Oral BID WC  . enoxaparin (LOVENOX) injection  40 mg Subcutaneous Q24H  . escitalopram  5 mg Oral Daily  . feeding supplement (ENSURE COMPLETE)  237 mL Oral TID BM  . losartan  50 mg Oral Daily  . polyethylene glycol  17 g Oral Daily  . senna  1 tablet Oral BID  . simvastatin   20 mg Oral Daily   Continuous Infusions: . sodium chloride 125 mL/hr at 01/09/14 0421   PRN Meds:.acetaminophen **OR** acetaminophen, alum & mag hydroxide-simeth, diazepam, hydrALAZINE, hydrALAZINE, HYDROcodone-acetaminophen, ketorolac, menthol-cetylpyridinium **OR** phenol, methocarbamol **OR** methocarbamol (ROBAXIN)  IV, metoCLOPramide **OR** metoCLOPramide (REGLAN) injection, morphine injection, ondansetron **OR** ondansetron (ZOFRAN) IV, sorbitol    PHYSICAL EXAM: Vital signs in last 24 hours: Filed Vitals:   01/10/14 0151 01/10/14 0400 01/10/14 0610 01/10/14 0800  BP: 129/49  120/46   Pulse: 95  87   Temp: 98.9 F (37.2 C)  99 F (37.2 C)   TempSrc:      Resp: 18 18 16 18   Height:      Weight:      SpO2: 97% 97% 94% 95%    Weight change:  Filed Weights   01/08/14 2016  Weight: 54.432 kg (120 lb)   Body mass index is 21.26 kg/(m^2).   Gen Exam: Awake and alert with clear speech.   Neck: Supple, No JVD.   Chest: B/L Clear.   CVS: S1 S2 Regular, no murmurs.  Abdomen: soft, BS +, non tender, non distended.  Extremities: no edema, lower extremities warm to touch. Neurologic: Non Focal.   Skin: No Rash.   Wounds: N/A.   Intake/Output from previous day:  Intake/Output Summary (Last 24 hours) at 01/10/14 1355 Last data filed at 01/10/14 0800  Gross per 24 hour  Intake 4398.33 ml  Output      0 ml  Net 4398.33  ml     LAB RESULTS: CBC  Recent Labs Lab 01/08/14 1306 01/09/14 0615 01/10/14 0551  WBC 10.5 7.8 10.3  HGB 12.6 9.9* 8.2*  HCT 38.7 30.4* 24.8*  PLT 173 139* 111*  MCV 89.4 89.7 88.9  MCH 29.1 29.2 29.4  MCHC 32.6 32.6 33.1  RDW 12.7 12.7 12.7    Chemistries   Recent Labs Lab 01/08/14 1306 01/09/14 0615 01/10/14 0551  NA 139 139 135  K 3.8 4.1 3.8  CL 105 107 107  CO2 24 28 22   GLUCOSE 143* 121* 144*  BUN 19 15 19   CREATININE 0.86 0.86 0.94  CALCIUM 9.4 8.2* 8.2*    CBG: No results for input(s): GLUCAP in the last 168  hours.  GFR Estimated Creatinine Clearance: 39.5 mL/min (by C-G formula based on Cr of 0.94).  Coagulation profile No results for input(s): INR, PROTIME in the last 168 hours.  Cardiac Enzymes No results for input(s): CKMB, TROPONINI, MYOGLOBIN in the last 168 hours.  Invalid input(s): CK  Invalid input(s): POCBNP No results for input(s): DDIMER in the last 72 hours. No results for input(s): HGBA1C in the last 72 hours. No results for input(s): CHOL, HDL, LDLCALC, TRIG, CHOLHDL, LDLDIRECT in the last 72 hours. No results for input(s): TSH, T4TOTAL, T3FREE, THYROIDAB in the last 72 hours.  Invalid input(s): FREET3 No results for input(s): VITAMINB12, FOLATE, FERRITIN, TIBC, IRON, RETICCTPCT in the last 72 hours. No results for input(s): LIPASE, AMYLASE in the last 72 hours.  Urine Studies No results for input(s): UHGB, CRYS in the last 72 hours.  Invalid input(s): UACOL, UAPR, USPG, UPH, UTP, UGL, UKET, UBIL, UNIT, UROB, ULEU, UEPI, UWBC, URBC, UBAC, CAST, UCOM, BILUA  MICROBIOLOGY: No results found for this or any previous visit (from the past 240 hour(s)).  RADIOLOGY STUDIES/RESULTS: Dg Chest 1 View  01/08/2014   CLINICAL DATA:  Acute left hip fracture.  EXAM: CHEST - 1 VIEW  COMPARISON:  None.  FINDINGS: The heart size and mediastinal contours are normal. There is no evidence of pulmonary edema, consolidation, pneumothorax, nodule or pleural fluid. No fractures identified.  IMPRESSION: No active disease in the chest.   Electronically Signed   By: Aletta Edouard M.D.   On: 01/08/2014 13:56   Dg Hip Complete Left  01/08/2014   CLINICAL DATA:  Acute left hip pain after fall today.  EXAM: LEFT HIP - COMPLETE 2+ VIEW  COMPARISON:  None.  FINDINGS: Moderately displaced and angulated fracture of the proximal left femoral neck is noted. Femoral head is situated within the acetabulum. This appears to be closed and posttraumatic.  IMPRESSION: Moderately displaced and angulated proximal  left femoral neck fracture. This is the initial encounter.   Electronically Signed   By: Sabino Dick M.D.   On: 01/08/2014 13:57   Pelvis Portable  01/08/2014   CLINICAL DATA:  Postoperative evaluation of left hip joint replacement  EXAM: PORTABLE PELVIS 1-2 VIEWS  COMPARISON:  None.  FINDINGS: Unipolar left hip prosthesis with components in anticipated position. Surgical staples over the greater trochanter with small volume of air in the soft tissues consistent with postoperative change. Pelvic bones intact otherwise.  IMPRESSION: Anticipated postoperative appearance.   Electronically Signed   By: Skipper Cliche M.D.   On: 01/08/2014 19:29    Oren Binet, MD  [facility] Pager:[phone]  If 7PM-7AM, please contact night-coverage [email or web address] Password [email or web address] 01/10/2014, 1:55 PM   LOS: 2 days

## 2014-01-10 NOTE — Progress Notes (Signed)
   Subjective:  Patient reports pain as moderate.  Slightly confused this am.  Objective:   VITALS:   Filed Vitals:   01/10/14 0000 01/10/14 0151 01/10/14 0400 01/10/14 0610  BP:  129/49  120/46  Pulse:  95  87  Temp:  98.9 F (37.2 C)  99 F (37.2 C)  TempSrc:      Resp: 18 18 18 16   Height:      Weight:      SpO2: 97% 97% 97% 94%    Neurovascular intact Sensation intact distally Intact pulses distally Dorsiflexion/Plantar flexion intact Incision: dressing C/D/I and no drainage No cellulitis present Compartment soft    Lab Results  Component Value Date   WBC 10.3 01/10/2014   HGB 8.2* 01/10/2014   HCT 24.8* 01/10/2014   MCV 88.9 01/10/2014   PLT PENDING 01/10/2014     Assessment/Plan:  1 day postop  - Expected postop acute blood loss anemia - will monitor for symptoms - Up with PT/OT - DVT ppx - SCDs, ambulation, lovenox - WBAT left lower extremity, posterior hip precautions - Pain control - Discharge planning - likely SNF  Marianna Payment 01/10/2014, 8:04 AM [phone]

## 2014-01-10 NOTE — Progress Notes (Signed)
   Subjective:  Patient reports pain as mild.  Objective:   VITALS:   Filed Vitals:   01/10/14 0000 01/10/14 0151 01/10/14 0400 01/10/14 0610  BP:  129/49  120/46  Pulse:  95  87  Temp:  98.9 F (37.2 C)  99 F (37.2 C)  TempSrc:      Resp: 18 18 18 16   Height:      Weight:      SpO2: 97% 97% 97% 94%    Neurologically intact Neurovascular intact Sensation intact distally Intact pulses distally Dorsiflexion/Plantar flexion intact Incision: dressing C/D/I and no drainage No cellulitis present Compartment soft   Lab Results  Component Value Date   WBC 10.3 01/10/2014   HGB 8.2* 01/10/2014   HCT 24.8* 01/10/2014   MCV 88.9 01/10/2014   PLT PENDING 01/10/2014     Assessment/Plan:  2 Days Post-Op   - Hgb trending down - 8.2 - may need transfusion tomorrow if below 8.0 - Up with PT/OT - SNF - DVT ppx - SCDs, ambulation, lovenox - WBAT left lower extremity, posterior hip precautions  Marianna Payment 01/10/2014, 8:05 AM [phone]

## 2014-01-10 NOTE — Evaluation (Signed)
Occupational Therapy Evaluation Patient Details Name: Jackie Phelps MRN: [id number] DOB: 28-Apr-1931 Today's Date: 01/10/2014    History of Present Illness Pt is an 81 yo female admitted 01/08/14 for Left displaced femoral neck fracture:secondary to mechanical fall. Underwent Open treatment of femoral fracture, proximal end, neck, prosthetic replacement on 01/08/14. Pt now L LE WBAT and post hip prec.   Clinical Impression   Pt. Was cooperative with therapy but is still having 8/10 pain and decreased STM. Pt. Is requiring Max A with mobility currently and plans to d/c to SNF for rehab. Pt. Is Max to Dep with LE ADLs currently and will need ed. On use of AE to increase I and safety. Pt. Is still experiencing confusion and will need further ed. On  Hip precautions and use of AE and DME. Skilled OT to continue on Acute to decrease caregiver burden and safety at next venue of care.   Follow Up Recommendations  SNF    Equipment Recommendations  None recommended by OT    Recommendations for Other Services       Precautions / Restrictions Precautions Precautions: Fall;Posterior Hip Precaution Booklet Issued: Yes (comment) Precaution Comments: pt and dtr educated, dtr with verbal understanding Restrictions Weight Bearing Restrictions: Yes Other Position/Activity Restrictions:  (WBAT)      Mobility Bed Mobility Overal bed mobility: Needs Assistance Bed Mobility: Supine to Sit     Supine to sit: Max assist     General bed mobility comments:  (cues for technique.)  Transfers Overall transfer level: Needs assistance   Transfers: Sit to/from Stand;Stand Pivot Transfers Sit to Stand: Max assist Stand pivot transfers: Max assist            Balance                                            ADL Overall ADL's : Needs assistance/impaired Eating/Feeding: Independent   Grooming: Wash/dry hands;Wash/dry face;Brushing hair;Set up;Sitting   Upper Body Bathing:  Set up;Sitting   Lower Body Bathing: Maximal assistance;Cueing for safety;Adhering to hip precautions;Sit to/from stand   Upper Body Dressing : Set up;Sitting   Lower Body Dressing: Total assistance;Adhering to hip precautions;Sit to/from stand   Toilet Transfer: Moderate assistance;Maximal assistance;Adhering to hip precautions;Stand-pivot;BSC           Functional mobility during ADLs: Maximal assistance General ADL Comments:  (Pt. ed. on use of  AE for LE ADLs. Pt. ed.on posterior hip p)     Vision                     Perception     Praxis      Pertinent Vitals/Pain Pain Assessment: 0-10 Pain Score: 5  Pain Location:  (L hip) Pain Descriptors / Indicators: Aching;Constant Pain Intervention(s): Monitored during session;Patient requesting pain meds-RN notified     Hand Dominance Right   Extremity/Trunk Assessment Upper Extremity Assessment Upper Extremity Assessment: Overall WFL for tasks assessed           Communication Communication Communication: No difficulties   Cognition Arousal/Alertness: Awake/alert Behavior During Therapy: WFL for tasks assessed/performed Overall Cognitive Status: Within Functional Limits for tasks assessed                     General Comments       Exercises       Shoulder Instructions  Home Living Family/patient expects to be discharged to:: Skilled nursing facility Living Arrangements: Spouse/significant other                                      Prior Functioning/Environment Level of Independence: Independent             OT Diagnosis: Acute pain   OT Problem List: Decreased activity tolerance;Decreased knowledge of use of DME or AE;Decreased knowledge of precautions;Pain   OT Treatment/Interventions: Self-care/ADL training;Neuromuscular education;DME and/or AE instruction;Therapeutic activities    OT Goals(Current goals can be found in the care plan section) Acute Rehab OT  Goals Patient Stated Goal:  (go to rehab and get better.) OT Goal Formulation: With patient Time For Goal Achievement: 01/24/14 Potential to Achieve Goals: Good ADL Goals Pt Will Perform Grooming: with min guard assist;standing Pt Will Perform Lower Body Bathing: with min guard assist;with adaptive equipment;sit to/from stand Pt Will Perform Lower Body Dressing: with min guard assist;with adaptive equipment;sit to/from stand Pt Will Transfer to Toilet: with min guard assist;bedside commode  OT Frequency: Min 2X/week   Barriers to D/C: Decreased caregiver support          Co-evaluation              End of Session    Activity Tolerance: Patient tolerated treatment well Patient left: in chair;with call bell/phone within reach   Time: 0710-0807 OT Time Calculation (min): 57 min Charges:  OT General Charges $OT Visit: 1 Procedure OT Evaluation $Initial OT Evaluation Tier I: 1 Procedure OT Treatments $Self Care/Home Management : 23-37 mins $Therapeutic Activity: 8-22 mins G-Codes:    Wende Longstreth 11-Jan-2014, 8:06 AM

## 2014-01-11 LAB — CBC
HCT: 22.3 % — ABNORMAL LOW (ref 36.0–46.0)
Hemoglobin: 7.4 g/dL — ABNORMAL LOW (ref 12.0–15.0)
MCH: 29.5 pg (ref 26.0–34.0)
MCHC: 33.2 g/dL (ref 30.0–36.0)
MCV: 88.8 fL (ref 78.0–100.0)
Platelets: 105 10*3/uL — ABNORMAL LOW (ref 150–400)
RBC: 2.51 MIL/uL — ABNORMAL LOW (ref 3.87–5.11)
RDW: 12.8 % (ref 11.5–15.5)
WBC: 8 10*3/uL (ref 4.0–10.5)

## 2014-01-11 LAB — PREPARE RBC (CROSSMATCH)

## 2014-01-11 MED ORDER — FLEET ENEMA 7-19 GM/118ML RE ENEM: 1.0000 | ENEMA | Freq: Every day | RECTAL | Status: DC | PRN

## 2014-01-11 MED ORDER — DIPHENHYDRAMINE HCL 50 MG/ML IJ SOLN
25.0000 mg | Freq: Once | INTRAMUSCULAR | Status: AC
  Administered 2014-01-11: 25 mg via INTRAVENOUS
  Filled 2014-01-11: qty 1

## 2014-01-11 MED ORDER — FUROSEMIDE 10 MG/ML IJ SOLN
20.0000 mg | Freq: Once | INTRAMUSCULAR | Status: AC
  Administered 2014-01-11: 20 mg via INTRAVENOUS
  Filled 2014-01-11: qty 2

## 2014-01-11 MED ORDER — ACETAMINOPHEN 325 MG PO TABS
650.0000 mg | ORAL_TABLET | Freq: Once | ORAL | Status: AC
  Administered 2014-01-11: 650 mg via ORAL
  Filled 2014-01-11: qty 2

## 2014-01-11 MED ORDER — SODIUM CHLORIDE 0.9 % IV SOLN
Freq: Once | INTRAVENOUS | Status: DC
Start: 2014-01-11 — End: 2014-01-12

## 2014-01-11 NOTE — Progress Notes (Signed)
PATIENT DETAILS Name: Jackie Phelps Age: 81 y.o. Sex: female Date of Birth: 1931/10/23 Admit Date: 01/08/2014 Admitting Physician Evalee Mutton Kristeen Mans, MD FFM:[id number] C, MD  Subjective:  No major complaints.  Assessment/Plan: Principal Problem:   Left displaced femoral neck fracture:secondary to mechanical fall. Underwent Open treatment of femoral fracture, proximal end, neck, prosthetic replacement on 01/08/14. Await PT eval, Suspect will need SNF on discharge. Continue supportive care. On Lovenox for DVT prophylaxis.  Active Problems:   Anemia:from perioperative blood loss. Hb dropped to 7.4 on 1/6, transfuse 1 unit of PRBC. Recheck CBC in am.    Hypertension: Continue with losartan and Coreg    Chronic constipation: on bowel regimen with senokot and Miralax. Follow    Dyslipidemia: Continue statins.  Disposition: Remain inpatient-SNF 1/7  Antibiotics:  See below.   Anti-infectives    Start     Dose/Rate Route Frequency Ordered Stop   01/08/14 2200  clindamycin (CLEOCIN) IVPB 600 mg     600 mg100 mL/hr over 30 Minutes Intravenous Every 6 hours 01/08/14 2057 01/09/14 0726   01/08/14 1637  clindamycin (CLEOCIN) 600 MG/50ML IVPB    Comments:  Marinda Elk   : cabinet override      01/08/14 1637 01/08/14 1710      DVT Prophylaxis: Prophylactic Lovenox   Code Status: Full code   Family Communication None at bedside-patient awake/alert and understanding of treatment plan  Procedures: Open treatment of femoral fracture, proximal end, neck, prosthetic replacement on 01/08/14.  CONSULTS:  orthopedic surgery   MEDICATIONS: Scheduled Meds: . sodium chloride   Intravenous Once  . acetaminophen  650 mg Oral Once  . aspirin  81 mg Oral Daily  . carvedilol  12.5 mg Oral BID WC  . diphenhydrAMINE  25 mg Intravenous Once  . enoxaparin (LOVENOX) injection  40 mg Subcutaneous Q24H  . escitalopram  5 mg Oral Daily  . feeding supplement (ENSURE COMPLETE)   237 mL Oral TID BM  . furosemide  20 mg Intravenous Once  . losartan  50 mg Oral Daily  . polyethylene glycol  17 g Oral Daily  . senna  1 tablet Oral BID  . simvastatin  20 mg Oral Daily   Continuous Infusions: . sodium chloride 125 mL/hr at 01/09/14 0421   PRN Meds:.acetaminophen **OR** acetaminophen, alum & mag hydroxide-simeth, diazepam, hydrALAZINE, hydrALAZINE, HYDROcodone-acetaminophen, menthol-cetylpyridinium **OR** phenol, methocarbamol **OR** methocarbamol (ROBAXIN)  IV, metoCLOPramide **OR** metoCLOPramide (REGLAN) injection, morphine injection, ondansetron **OR** ondansetron (ZOFRAN) IV, sorbitol    PHYSICAL EXAM: Vital signs in last 24 hours: Filed Vitals:   01/10/14 0610 01/10/14 0800 01/10/14 2200 01/11/14 0559  BP: 120/46  119/44 132/55  Pulse: 87  86 88  Temp: 99 F (37.2 C)  99 F (37.2 C) 98.5 F (36.9 C)  TempSrc:      Resp: 16 18 16 16   Height:      Weight:      SpO2: 94% 95% 95% 92%    Weight change:  Filed Weights   01/08/14 2016  Weight: 54.432 kg (120 lb)   Body mass index is 21.26 kg/(m^2).   Gen Exam: Awake and alert with clear speech.   Neck: Supple, No JVD.   Chest: B/L Clear.  No rales or rhonchi CVS: S1 S2 Regular, no murmurs.  Abdomen: soft, BS +, non tender, non distended.  Extremities: no edema, lower extremities warm to touch. Neurologic: Non Focal.   Skin: No Rash.   Wounds:  N/A.   Intake/Output from previous day:  Intake/Output Summary (Last 24 hours) at 01/11/14 0845 Last data filed at 01/10/14 1817  Gross per 24 hour  Intake    240 ml  Output    150 ml  Net     90 ml     LAB RESULTS: CBC  Recent Labs Lab 01/08/14 1306 01/09/14 0615 01/10/14 0551 01/10/14 1720 01/11/14 0608  WBC 10.5 7.8 10.3 10.5 8.0  HGB 12.6 9.9* 8.2* 8.5* 7.4*  HCT 38.7 30.4* 24.8* 25.8* 22.3*  PLT 173 139* 111* 124* PENDING  MCV 89.4 89.7 88.9 89.6 88.8  MCH 29.1 29.2 29.4 29.5 29.5  MCHC 32.6 32.6 33.1 32.9 33.2  RDW 12.7 12.7 12.7  12.8 12.8    Chemistries   Recent Labs Lab 01/08/14 1306 01/09/14 0615 01/10/14 0551  NA 139 139 135  K 3.8 4.1 3.8  CL 105 107 107  CO2 24 28 22   GLUCOSE 143* 121* 144*  BUN 19 15 19   CREATININE 0.86 0.86 0.94  CALCIUM 9.4 8.2* 8.2*    CBG: No results for input(s): GLUCAP in the last 168 hours.  GFR Estimated Creatinine Clearance: 39.5 mL/min (by C-G formula based on Cr of 0.94).  Coagulation profile No results for input(s): INR, PROTIME in the last 168 hours.  Cardiac Enzymes No results for input(s): CKMB, TROPONINI, MYOGLOBIN in the last 168 hours.  Invalid input(s): CK  Invalid input(s): POCBNP No results for input(s): DDIMER in the last 72 hours. No results for input(s): HGBA1C in the last 72 hours. No results for input(s): CHOL, HDL, LDLCALC, TRIG, CHOLHDL, LDLDIRECT in the last 72 hours. No results for input(s): TSH, T4TOTAL, T3FREE, THYROIDAB in the last 72 hours.  Invalid input(s): FREET3 No results for input(s): VITAMINB12, FOLATE, FERRITIN, TIBC, IRON, RETICCTPCT in the last 72 hours. No results for input(s): LIPASE, AMYLASE in the last 72 hours.  Urine Studies No results for input(s): UHGB, CRYS in the last 72 hours.  Invalid input(s): UACOL, UAPR, USPG, UPH, UTP, UGL, UKET, UBIL, UNIT, UROB, ULEU, UEPI, UWBC, URBC, UBAC, CAST, UCOM, BILUA  MICROBIOLOGY: No results found for this or any previous visit (from the past 240 hour(s)).  RADIOLOGY STUDIES/RESULTS: Dg Chest 1 View  01/08/2014   CLINICAL DATA:  Acute left hip fracture.  EXAM: CHEST - 1 VIEW  COMPARISON:  None.  FINDINGS: The heart size and mediastinal contours are normal. There is no evidence of pulmonary edema, consolidation, pneumothorax, nodule or pleural fluid. No fractures identified.  IMPRESSION: No active disease in the chest.   Electronically Signed   By: Aletta Edouard M.D.   On: 01/08/2014 13:56   Dg Hip Complete Left  01/08/2014   CLINICAL DATA:  Acute left hip pain after fall  today.  EXAM: LEFT HIP - COMPLETE 2+ VIEW  COMPARISON:  None.  FINDINGS: Moderately displaced and angulated fracture of the proximal left femoral neck is noted. Femoral head is situated within the acetabulum. This appears to be closed and posttraumatic.  IMPRESSION: Moderately displaced and angulated proximal left femoral neck fracture. This is the initial encounter.   Electronically Signed   By: Sabino Dick M.D.   On: 01/08/2014 13:57   Pelvis Portable  01/08/2014   CLINICAL DATA:  Postoperative evaluation of left hip joint replacement  EXAM: PORTABLE PELVIS 1-2 VIEWS  COMPARISON:  None.  FINDINGS: Unipolar left hip prosthesis with components in anticipated position. Surgical staples over the greater trochanter with small volume of air in  the soft tissues consistent with postoperative change. Pelvic bones intact otherwise.  IMPRESSION: Anticipated postoperative appearance.   Electronically Signed   By: Skipper Cliche M.D.   On: 01/08/2014 19:29    Oren Binet, MD  [facility] Pager:[phone]  If 7PM-7AM, please contact night-coverage [email or web address] Password [email or web address] 01/11/2014, 8:45 AM   LOS: 3 days

## 2014-01-11 NOTE — Progress Notes (Signed)
CARE MANAGEMENT NOTE 01/11/2014  Patient:  ANELA, BENSMAN   Account Number:  [id number]  Date Initiated:  01/10/2014  Documentation initiated by:  [facility]  Subjective/Objective Assessment:   ARTHROPLASTY BIPOLAR HIP (Left)     Action/Plan:   Anticipated DC Date:  01/11/2014   Anticipated DC Plan:  SKILLED NURSING FACILITY  In-house referral  Clinical Social Worker      DC Planning Services  CM consult      Choice offered to / List presented to:             Status of service:  Completed, signed off Medicare Important Message given?  YES (If response is "NO", the following Medicare IM given date fields will be blank) Date Medicare IM given:  01/11/2014 Medicare IM given by:  [facility] Date Additional Medicare IM given:   Additional Medicare IM given by:    Discharge Disposition:  [place]  Per UR Regulation:  Reviewed for med. necessity/level of care/duration of stay  If discussed at [place] of Stay Meetings, dates discussed:    Comments:  01/11/2014 1300 NCM spoke to pt and no NCM needs identified. Plan is DC to SNF. CSW following for SNF placement. Jonnie Finner RN CCM Case Mgmt phone [phone]

## 2014-01-11 NOTE — Progress Notes (Signed)
Dressings changed SNF pending Hgb stable May dc from ortho standpoint F/u 2 weeks  N. Eduard Roux, MD [place] 8:04 AM

## 2014-01-11 NOTE — Progress Notes (Signed)
Physical Therapy Treatment Patient Details Name: Jackie Phelps MRN: [id number] DOB: [birth year] Today's Date: 01/11/2014    History of Present Illness Pt is an 81 yo female admitted 01/08/14 for Left displaced femoral neck fracture:secondary to mechanical fall. Underwent Open treatment of femoral fracture, proximal end, neck, prosthetic replacement on 01/08/14. Pt now L LE WBAT and post hip prec.    PT Comments    Pt is progressing well with her mobility. She needs max reinforcement of precautions and sequencing cues as there does not seem to be much carryover from session to session.  Pt continues to be an excellent candidate for SNF level rehab before she returns home.   PT to follow acutely for deficits listed below.     Follow Up Recommendations  SNF     Equipment Recommendations  Rolling walker with 5" wheels    Recommendations for Other Services   NA     Precautions / Restrictions Precautions Precautions: Fall;Posterior Hip Precaution Comments: Pt unable to report any hip precautions.  Precaution handout and exercise handout given for her to "study" as well as precautions verbally reviewed.   Restrictions LLE Weight Bearing: Weight bearing as tolerated    Mobility  Bed Mobility Overal bed mobility: Needs Assistance Bed Mobility: Supine to Sit     Supine to sit: Min assist     General bed mobility comments: Min assist to help progress left leg to EOB.  Min assist to support trunk to get to sitting from [facility] elevated ~30 degrees. Verbal cues for 1/2 bridge and for hand placement and sequencing during transitions.   Transfers Overall transfer level: Needs assistance Equipment used: Rolling walker (2 wheeled) Transfers: Sit to/from Stand Sit to Stand: Min assist         General transfer comment: Min assist to support trunk during transitions.  Verbal cues for safe hand placement.  Ambulation/Gait Ambulation/Gait assistance: Min assist Ambulation Distance (Feet): 25  Feet Assistive device: Rolling walker (2 wheeled) Gait Pattern/deviations: Step-to pattern;Antalgic Gait velocity: decreased   General Gait Details: Max verbal cues for LE sequencing (no carryover from last session).  Min assist to support trunk when WB on left leg.            Balance Overall balance assessment: Needs assistance Sitting-balance support: Feet supported;No upper extremity supported Sitting balance-Leahy Scale: Good     Standing balance support: Bilateral upper extremity supported;No upper extremity supported;Single extremity supported Standing balance-Leahy Scale: Fair                      Cognition Arousal/Alertness: Awake/alert Behavior During Therapy: WFL for tasks assessed/performed Overall Cognitive Status: Impaired/Different from baseline Area of Impairment: Memory     Memory: Decreased recall of precautions;Decreased short-term memory (no family to attest to baseline)              Exercises Total Joint Exercises Ankle Circles/Pumps: AROM;Both;10 reps;Supine Quad Sets: AROM;10 reps;Left Short Arc Quad: AROM;Left;10 reps;Supine Heel Slides: AAROM;Left;10 reps Hip ABduction/ADduction: AAROM;Left;10 reps;Supine        Pertinent Vitals/Pain Pain Assessment: Faces Faces Pain Scale: Hurts a little bit Pain Location: left hip Pain Descriptors / Indicators: Aching;Burning Pain Intervention(s): Limited activity within patient's tolerance;Monitored during session;Repositioned           PT Goals (current goals can now be found in the care plan section) Progress towards PT goals: Progressing toward goals    Frequency  7X/week (she is Medicare hemi- per bundled plan she should  be BID 7xs)    PT Plan Frequency needs to be updated       End of Session Equipment Utilized During Treatment: Gait belt Activity Tolerance: Patient limited by pain Patient left: in chair;with call bell/phone within reach;with nursing/sitter in room      Time: 9692-4932 PT Time Calculation (min) (ACUTE ONLY): 25 min  Charges:  $Gait Training: 8-22 mins $Therapeutic Exercise: 8-22 mins                      Heily Carlucci B. [place], [place], DPT [phone]   01/11/2014, 4:45 PM

## 2014-01-11 NOTE — Clinical Social Work Placement (Signed)
Clinical Social Work Department CLINICAL SOCIAL WORK PLACEMENT NOTE 01/11/2014  Patient:  Jackie Phelps, Jackie Phelps  Account Number:  [id number] Admit date:  01/08/2014  Clinical Social Worker:  Wylene Men  Date/time:  01/10/2014 10:37 AM  Clinical Social Work is seeking post-discharge placement for this patient at the following level of care:   SKILLED NURSING   (*CSW will update this form in Epic as items are completed)   01/10/2014  Patient/family provided with [place] Department of Clinical Social Work's list of facilities offering this level of care within the geographic area requested by the patient (or if unable, by the patient's family).  01/10/2014  Patient/family informed of their freedom to choose among providers that offer the needed level of care, that participate in Medicare, Medicaid or managed care program needed by the patient, have an available bed and are willing to accept the patient.  01/10/2014  Patient/family informed of [facility]' ownership interest in [facility], as well as of the fact that they are under no obligation to receive care at this facility.  PASARR submitted to EDS on 01/10/2014 PASARR number received on 01/10/2014  FL2 transmitted to all facilities in geographic area requested by pt/family on  01/10/2014 FL2 transmitted to all facilities within larger geographic area on   Patient informed that his/her managed care company has contracts with or will negotiate with  certain facilities, including the following:     Patient/family informed of bed offers received:  01/11/2014 Patient chooses bed at [facility] Physician recommends and patient chooses bed at    Patient to be transferred to [facility] on   Patient to be transferred to facility by [facility] Patient and family notified of transfer on 01/11/2014 Name of family member notified:    The following physician request were entered in Epic:   Additional  Comments:  Nonnie Done, [place] [phone]  Psychiatric & Orthopedics (5N 1-16) Clinical Social Worker

## 2014-01-11 NOTE — Clinical Social Work Psychosocial (Signed)
Clinical Social Work Department BRIEF PSYCHOSOCIAL ASSESSMENT 01/11/2014  Patient:  ALLISSON, SCHINDEL     Account Number:  [id number]     Admit date:  01/08/2014  Clinical Social Worker:  Wylene Men  Date/Time:  01/10/2014 10:30 AM  Referred by:  Physician  Date Referred:  01/10/2014 Referred for  SNF Placement  Psychosocial assessment   Other Referral:   none   Interview type:  Other - See comment Other interview type:   daughter and patient    PSYCHOSOCIAL DATA Living Status:  HUSBAND Admitted from facility:   Level of care:   Primary support name:  Raquel Sarna Primary support relationship to patient:  CHILD, ADULT Degree of support available:   strong    CURRENT CONCERNS Current Concerns  Post-Acute Placement   Other Concerns:    SOCIAL WORK ASSESSMENT / PLAN CSW assessed patient at bedside. Patient was alert and oriented x4 during the course of this assessment.  Patient requested daughter, Raquel Sarna to remain at bedside during the course of this assessment.  Raquel Sarna states she is the main caregiver for both patient and patient's husband.  Both are independent, but daughter remains to be actively involved in all activities and daily cares.  Patient and daughter are requesting [facility] once medically discharged.  [place] is projecting a discharge and is able to offer a bed pending this discharge.  Patient is requesting [facility] transportation.   Assessment/plan status:  Psychosocial Support/Ongoing Assessment of Needs Other assessment/ plan:   FL2  PASARR   Information/referral to community resources:   SNF/STR    PATIENT'S/FAMILY'S RESPONSE TO PLAN OF CARE: Patient is agreeable to [facility] search and first choice is [employer].       Nonnie Done, [place] [phone]  Psychiatric & Orthopedics (5N 1-16) Clinical Social Worker

## 2014-01-12 ENCOUNTER — Encounter (HOSPITAL_COMMUNITY): Payer: Self-pay | Admitting: Orthopaedic Surgery

## 2014-01-12 ENCOUNTER — Inpatient Hospital Stay
Admission: RE | Admit: 2014-01-12 | Discharge: 2014-03-06 | Disposition: A | Discharge status: Home / Self Care | Payer: Medicare Other | Source: Ambulatory Visit | Attending: Internal Medicine | Admitting: Internal Medicine

## 2014-01-12 DIAGNOSIS — S72002A Fracture of unspecified part of neck of left femur, initial encounter for closed fracture: Secondary | ICD-10-CM

## 2014-01-12 DIAGNOSIS — R9389 Abnormal findings on diagnostic imaging of other specified body structures: Principal | ICD-10-CM

## 2014-01-12 DIAGNOSIS — M216X2 Other acquired deformities of left foot: Secondary | ICD-10-CM

## 2014-01-12 DIAGNOSIS — R05 Cough: Secondary | ICD-10-CM

## 2014-01-12 DIAGNOSIS — R059 Cough, unspecified: Secondary | ICD-10-CM

## 2014-01-12 DIAGNOSIS — W19XXXA Unspecified fall, initial encounter: Secondary | ICD-10-CM

## 2014-01-12 DIAGNOSIS — R0602 Shortness of breath: Secondary | ICD-10-CM

## 2014-01-12 LAB — CBC
HCT: 27.4 % — ABNORMAL LOW (ref 36.0–46.0)
Hemoglobin: 9.5 g/dL — ABNORMAL LOW (ref 12.0–15.0)
MCH: 31.4 pg (ref 26.0–34.0)
MCHC: 34.7 g/dL (ref 30.0–36.0)
MCV: 90.4 fL (ref 78.0–100.0)
Platelets: 147 10*3/uL — ABNORMAL LOW (ref 150–400)
RBC: 3.03 MIL/uL — ABNORMAL LOW (ref 3.87–5.11)
RDW: 12.8 % (ref 11.5–15.5)
WBC: 6.4 10*3/uL (ref 4.0–10.5)

## 2014-01-12 LAB — TYPE AND SCREEN
ABO/RH(D): A POS
ANTIBODY SCREEN: NEGATIVE
Unit division: 0

## 2014-01-12 MED ORDER — DIAZEPAM 5 MG PO TABS: 5.0000 mg | ORAL_TABLET | ORAL | Status: DC | PRN

## 2014-01-12 MED ORDER — OXYCODONE HCL 5 MG PO TABS: 5.0000 mg | ORAL_TABLET | Freq: Four times a day (QID) | ORAL | Status: DC | PRN

## 2014-01-12 MED ORDER — POLYETHYLENE GLYCOL 3350 17 G PO PACK: 17.0000 g | PACK | Freq: Every day | ORAL | Status: DC

## 2014-01-12 MED ORDER — SENNA 8.6 MG PO TABS: 1.0000 | ORAL_TABLET | Freq: Two times a day (BID) | ORAL | Status: DC

## 2014-01-12 MED ORDER — ENOXAPARIN SODIUM 40 MG/0.4ML ~~LOC~~ SOLN: 40.0000 mg | Freq: Every day | SUBCUTANEOUS | Status: DC

## 2014-01-12 MED ORDER — ENSURE COMPLETE PO LIQD: 237.0000 mL | Freq: Three times a day (TID) | ORAL | Status: DC

## 2014-01-12 NOTE — Progress Notes (Signed)
Called report to [facility] and gave report to Lou Cal RN.

## 2014-01-12 NOTE — Progress Notes (Signed)
Occupational Therapy Treatment Patient Details Name: ERSILIA BRAWLEY MRN: [id number] DOB: 08/08/1931 Today's Date: 01/12/2014    History of present illness Pt is an 81 yo female admitted 01/08/14 for Left displaced femoral neck fracture:secondary to mechanical fall. Underwent Open treatment of femoral fracture, proximal end, neck, prosthetic replacement on 01/08/14. Pt now L LE WBAT and post hip prec.   OT comments  Pt progressing toward OT goals related to functional mobility and transfers during ADL retraining session today however, she continues to demonstrate confusion and no carry over w/ posterior hip precautions.    Follow Up Recommendations  SNF;Supervision/Assistance - 24 hour    Equipment Recommendations  None recommended by OT    Recommendations for Other Services      Precautions / Restrictions Precautions Precautions: Fall;Posterior Hip Precaution Comments: Pt unable to report any hip precautions.  Precaution handout reviewed and precautions verbally reviewed again after session as pt does not demonstrate any recall.   Restrictions Weight Bearing Restrictions: Yes LLE Weight Bearing: Weight bearing as tolerated       Mobility Bed Mobility Overal bed mobility: Needs Assistance Bed Mobility: Supine to Sit     Supine to sit: Min assist     General bed mobility comments: Min assist to help progress left leg to EOB.  Min assist to support trunk to get to sitting from [facility] elevated ~30 degrees. Verbal cues for hand placement and sequencing during transitions.   Transfers Overall transfer level: Needs assistance Equipment used: Rolling walker (2 wheeled) Transfers: Sit to/from [facility] Sit to Stand: Min assist Stand pivot transfers: Min assist       General transfer comment: Min assist to support trunk during transitions.  Verbal cues for safe hand placement.    Balance Overall balance assessment: Needs assistance Sitting-balance support: Feet  supported;No upper extremity supported Sitting balance-Leahy Scale: Good     Standing balance support: Bilateral upper extremity supported Standing balance-Leahy Scale: Fair Standing balance comment: Limited by pain LLE                   ADL                           Toilet Transfer: RW;BSC;Ambulation;Moderate assistance;Maximal assistance (Mod-max assist for clothing management and hygeine as well as follow through of THP)           Functional mobility during ADLs: Minimal assistance;Rolling walker;Cueing for safety;Cueing for sequencing General ADL Comments: Pt educated on THP (Posterior precautions at start of session. Pt participated in ADL retraining session for toileting and functional mobility/transfers today. She requires consistent vc's for safety, sequencing and redirection to tasks as she appears confused and stated at end of session that she remembers falling in hospital last night b/c her hip hurts. RN to view camera from room however pt also noted w/o carrover for precautions and is confused throughout session as well.       Vision  Has glasses for reading                   Perception     Praxis      Cognition   Behavior During Therapy: [facility] for tasks assessed/performed Overall Cognitive Status: Impaired/Different from baseline Area of Impairment: Memory     Memory: Decreased recall of precautions;Decreased short-term memory               Extremity/Trunk Assessment  Exercises     Shoulder Instructions       General Comments      Pertinent Vitals/ Pain       Pain Assessment: Faces Faces Pain Scale: Hurts little more Pain Location: left hip Pain Descriptors / Indicators: Sore;Aching Pain Intervention(s): Monitored during session;Limited activity within patient's tolerance;Repositioned;Patient requesting pain meds-RN notified  Home Living                                           Prior Functioning/Environment              Frequency Min 2X/week     Progress Toward Goals  OT Goals(current goals can now be found in the care plan section)  Progress towards OT goals: Progressing toward goals     Plan Discharge plan remains appropriate    Co-evaluation                 End of Session Equipment Utilized During Treatment: Gait belt;Rolling walker   Activity Tolerance Patient tolerated treatment well   Patient Left in chair;with call bell/phone within reach   Nurse Communication Mobility status;Patient requests pain meds        Time: 0933-1006 OT Time Calculation (min): 33 min  Charges: OT General Charges $OT Visit: 1 Procedure OT Treatments $Self Care/Home Management : 23-37 mins (33 min)  Arthurine Oleary Beth Dixon, OTR/L 01/12/2014, 10:18 AM

## 2014-01-12 NOTE — Clinical Social Work Note (Signed)
Patient to dc today per MD order. Patient to dc to: [facility] RN to call report prior to transportation to: [phone] Transportation: [facility]  CSW spoke with patient's daughter re: discharge plans.  All were agreeable.  RN updated.  Nonnie Done, [place] [phone]  Psychiatric & Orthopedics (5N 1-16) Clinical Social Worker

## 2014-01-12 NOTE — Discharge Summary (Signed)
PATIENT DETAILS Name: Jackie Phelps Age: 81 y.o. Sex: female Date of Birth: [birth year] MRN: [id number]. Admitting Physician: Jonetta Osgood, MD LGX:QJJHERD,EYCXKG C, MD  Admit Date: 01/08/2014 Discharge date: 01/12/2014  Recommendations for Outpatient Follow-up:  1. Please check CBC and chemistries in 1 week. 2. Ensure follow-up with orthopedics  PRIMARY DISCHARGE DIAGNOSIS:  Principal Problem:   Left displaced femoral neck fracture Active Problems:   Chronic constipation   Hypertension      PAST MEDICAL HISTORY: Past Medical History  Diagnosis Date  . Hypertension   . Hypercholesteremia   . Anxiety   . Constipation     DISCHARGE MEDICATIONS: Current Discharge Medication List    START taking these medications   Details  enoxaparin (LOVENOX) 40 MG/0.4ML injection Inject 0.4 mLs (40 mg total) into the skin daily. 17 more days from 01/12/14 Qty: 14 Syringe, Refills: 0    feeding supplement, ENSURE COMPLETE, (ENSURE COMPLETE) LIQD Take 237 mLs by mouth 3 (three) times daily between meals.    oxyCODONE (OXY IR/ROXICODONE) 5 MG immediate release tablet Take 1 tablet (5 mg total) by mouth every 6 (six) hours as needed for breakthrough pain. Qty: 30 tablet, Refills: 0    polyethylene glycol (MIRALAX / GLYCOLAX) packet Take 17 g by mouth daily. Qty: 14 each, Refills: 0    senna (SENOKOT) 8.6 MG TABS tablet Take 1 tablet (8.6 mg total) by mouth 2 (two) times daily. Qty: 120 each, Refills: 0      CONTINUE these medications which have CHANGED   Details  diazepam (VALIUM) 5 MG tablet Take 1 tablet (5 mg total) by mouth as needed. Qty: 30 tablet, Refills: 0      CONTINUE these medications which have NOT CHANGED   Details  aspirin 81 MG tablet Take 81 mg by mouth daily. Twice a day    carvedilol (COREG) 6.25 MG tablet Take 12.5 mg by mouth 2 (two) times daily with a meal.     escitalopram (LEXAPRO) 5 MG tablet Take 5 mg by mouth daily.  Refills: 0    losartan  (COZAAR) 50 MG tablet Take 50 mg by mouth daily.    multivitamin-lutein (OCUVITE-LUTEIN) CAPS Take 1 capsule by mouth daily.    simvastatin (ZOCOR) 20 MG tablet Take 20 mg by mouth daily.        ALLERGIES:   Allergies  Allergen Reactions  . Penicillins Rash    BRIEF HPI:  See H&P, Labs, Consult and Test reports for all details in brief, patient was admitted for a mechanical fall, following which she sustained a left hip fracture.  CONSULTATIONS:   orthopedic surgery  PERTINENT RADIOLOGIC STUDIES: Dg Chest 1 View  01/08/2014   CLINICAL DATA:  Acute left hip fracture.  EXAM: CHEST - 1 VIEW  COMPARISON:  None.  FINDINGS: The heart size and mediastinal contours are normal. There is no evidence of pulmonary edema, consolidation, pneumothorax, nodule or pleural fluid. No fractures identified.  IMPRESSION: No active disease in the chest.   Electronically Signed   By: Aletta Edouard M.D.   On: 01/08/2014 13:56   Dg Hip Complete Left  01/08/2014   CLINICAL DATA:  Acute left hip pain after fall today.  EXAM: LEFT HIP - COMPLETE 2+ VIEW  COMPARISON:  None.  FINDINGS: Moderately displaced and angulated fracture of the proximal left femoral neck is noted. Femoral head is situated within the acetabulum. This appears to be closed and posttraumatic.  IMPRESSION: Moderately displaced and angulated proximal left  femoral neck fracture. This is the initial encounter.   Electronically Signed   By: Sabino Dick M.D.   On: 01/08/2014 13:57   Pelvis Portable  01/08/2014   CLINICAL DATA:  Postoperative evaluation of left hip joint replacement  EXAM: PORTABLE PELVIS 1-2 VIEWS  COMPARISON:  None.  FINDINGS: Unipolar left hip prosthesis with components in anticipated position. Surgical staples over the greater trochanter with small volume of air in the soft tissues consistent with postoperative change. Pelvic bones intact otherwise.  IMPRESSION: Anticipated postoperative appearance.   Electronically Signed   By:  Skipper Cliche M.D.   On: 01/08/2014 19:29     PERTINENT LAB RESULTS: CBC:  Recent Labs  01/11/14 0608 01/12/14 0825  WBC 8.0 6.4  HGB 7.4* 9.5*  HCT 22.3* 27.4*  PLT 105* 147*   CMET CMP     Component Value Date/Time   NA 135 01/10/2014 0551   K 3.8 01/10/2014 0551   CL 107 01/10/2014 0551   CO2 22 01/10/2014 0551   GLUCOSE 144* 01/10/2014 0551   BUN 19 01/10/2014 0551   CREATININE 0.94 01/10/2014 0551   CALCIUM 8.2* 01/10/2014 0551   GFRNONAA 56* 01/10/2014 0551   GFRAA 65* 01/10/2014 0551    GFR Estimated Creatinine Clearance: 39.5 mL/min (by C-G formula based on Cr of 0.94). No results for input(s): LIPASE, AMYLASE in the last 72 hours. No results for input(s): CKTOTAL, CKMB, CKMBINDEX, TROPONINI in the last 72 hours. Invalid input(s): POCBNP No results for input(s): DDIMER in the last 72 hours. No results for input(s): HGBA1C in the last 72 hours. No results for input(s): CHOL, HDL, LDLCALC, TRIG, CHOLHDL, LDLDIRECT in the last 72 hours. No results for input(s): TSH, T4TOTAL, T3FREE, THYROIDAB in the last 72 hours.  Invalid input(s): FREET3 No results for input(s): VITAMINB12, FOLATE, FERRITIN, TIBC, IRON, RETICCTPCT in the last 72 hours. Coags: No results for input(s): INR in the last 72 hours.  Invalid input(s): PT Microbiology: No results found for this or any previous visit (from the past 240 hour(s)).   BRIEF HOSPITAL COURSE:  Left displaced femoral neck fracture:secondary to mechanical fall. Orthopedics was consulted, she underwent repair of the left femoral neck fracture on 01/08/14.  Doing well postoperatively, postoperative course complicated by acute blood loss anemia for which he was transfused 1 unit of PRBC. Patient was seen by physical therapy, recommendations are for SNF on discharge. Orthopedics recommending Lovenox for DVT prophylaxis.  Active Problems:  Anemia:from perioperative blood loss. Hb dropped to 7.4 on 1/6, subsequently was  transfused 1 unit of PRBC. Posttransfusion hemoglobin stable at   Hypertension: Continue with losartan and Coreg   Chronic constipation: on bowel regimen with senokot and Miralax.   Dyslipidemia: Continue statins.  TODAY-DAY OF DISCHARGE:  Subjective:   Jackie Phelps today has no headache,no chest abdominal pain,no new weakness tingling or numbness, feels much better wants to go home today.  Objective:   Blood pressure 143/58, pulse 83, temperature 98.6 F (37 C), temperature source Oral, resp. rate 16, height 5\' 3"  (1.6 m), weight 54.432 kg (120 lb), SpO2 96 %.  Intake/Output Summary (Last 24 hours) at 01/12/14 1001 Last data filed at 01/11/14 2218  Gross per 24 hour  Intake    745 ml  Output      0 ml  Net    745 ml   Filed Weights   01/08/14 2016  Weight: 54.432 kg (120 lb)    Exam Awake Alert, Oriented *3, No new F.N  deficits, Normal affect [place].AT,PERRAL Supple Neck,No JVD, No cervical lymphadenopathy appriciated.  Symmetrical Chest wall movement, Good air movement bilaterally, CTAB RRR,No Gallops,Rubs or new Murmurs, No Parasternal Heave +ve B.Sounds, Abd Soft, Non tender, No organomegaly appriciated, No rebound -guarding or rigidity. No Cyanosis, Clubbing or edema, No new Rash or bruise  DISCHARGE CONDITION: Stable  DISPOSITION: SNF  DISCHARGE INSTRUCTIONS:    Activity:  As tolerated with Full fall precautions use walker/cane & assistance as needed  Diet recommendation: Heart Healthy diet  Discharge Instructions    Call MD for:  redness, tenderness, or signs of infection (pain, swelling, redness, odor or green/yellow discharge around incision site)    Complete by:  As directed      Diet - low sodium heart healthy    Complete by:  As directed      Increase activity slowly    Complete by:  As directed      Weight bearing as tolerated    Complete by:  As directed            Follow-up Information    Follow up with Marianna Payment, MD In 2  weeks.   Specialty:  Orthopedic Surgery   Why:  For suture removal, For wound re-check   Contact information:   [place] [phone]       Follow up with Doree Albee, MD. Schedule an appointment as soon as possible for a visit in 1 week.   Specialty:  Internal Medicine   Contact information:   [place] [address] [phone]      Total Time spent on discharge equals 45 minutes.  SignedOren Binet 01/12/2014 10:01 AM

## 2014-01-12 NOTE — Progress Notes (Signed)
Physical Therapy Treatment Patient Details Name: Jackie Phelps MRN: [id number] DOB: [birth year] Today's Date: 01/12/2014    History of Present Illness Pt is an 81 yo female admitted 01/08/14 for Left displaced femoral neck fracture:secondary to mechanical fall. Underwent Open treatment of femoral fracture, proximal end, neck, prosthetic replacement on 01/08/14. Pt now L LE WBAT and post hip prec.    PT Comments    Pt cooperative with PT and making good progress.  Needs continued education on hip precautions.  Continue to recommend SNF for rehab.  Follow Up Recommendations  SNF     Equipment Recommendations  Rolling walker with 5" wheels    Recommendations for Other Services       Precautions / Restrictions Precautions Precautions: Fall;Posterior Hip Precaution Comments: OT had just finished treatment upon PT entry and had reviewed handout.  When PT asked about the precautions she could not recall them. Restrictions Weight Bearing Restrictions: Yes LLE Weight Bearing: Weight bearing as tolerated    Mobility  Bed Mobility Overal bed mobility: Needs Assistance Bed Mobility: Supine to Sit     Supine to sit: Min assist     General bed mobility comments: up in bed upon arrival.  Transfers Overall transfer level: Needs assistance Equipment used: Rolling walker (2 wheeled) Transfers: Sit to/from Stand Sit to Stand: Min assist Stand pivot transfers: Min assist       General transfer comment: Cues for technique and to maintain hip precautions.  Ambulation/Gait Ambulation/Gait assistance: Min assist Ambulation Distance (Feet): 50 Feet Assistive device: Rolling walker (2 wheeled) Gait Pattern/deviations: Step-through pattern;Decreased stance time - left     General Gait Details: Did better with sequencing as she progressed and given verbal cueing.   Stairs            Wheelchair Mobility    Modified Rankin (Stroke Patients Only)       Balance Overall  balance assessment: Needs assistance Sitting-balance support: Feet supported;No upper extremity supported Sitting balance-Leahy Scale: Good     Standing balance support: Bilateral upper extremity supported Standing balance-Leahy Scale: Fair Standing balance comment: WITH RW                    Cognition Arousal/Alertness: Awake/alert Behavior During Therapy: WFL for tasks assessed/performed Overall Cognitive Status: Impaired/Different from baseline Area of Impairment: Memory     Memory: Decreased short-term memory;Decreased recall of precautions         General Comments: Pt thinking therapist was someone she knew from "up [place]", corrected pt that she did not know therapist.  Later in session, pt still thinking therapist was old friend.    Exercises Total Joint Exercises Ankle Circles/Pumps: AROM;Both;10 reps Quad Sets: Both;10 reps;Strengthening Gluteal Sets: Strengthening;Both;10 reps Heel Slides: AAROM;Left;10 reps Hip ABduction/ADduction: AAROM;10 reps;Left    General Comments General comments (skin integrity, edema, etc.): Pt cooperative throughout session even though feeling like she isn't doing as well as she was.      Pertinent Vitals/Pain Pain Assessment: Faces Faces Pain Scale: Hurts little more Pain Location: L hip Pain Descriptors / Indicators: Sore Pain Intervention(s): Monitored during session;Limited activity within patient's tolerance;Repositioned    Home Living                      Prior Function            PT Goals (current goals can now be found in the care plan section) Acute Rehab PT Goals Patient Stated Goal: feel better  PT Goal Formulation: With patient Time For Goal Achievement: 01/23/14 Potential to Achieve Goals: Good Progress towards PT goals: Progressing toward goals    Frequency  7X/week    PT Plan      Co-evaluation             End of Session Equipment Utilized During Treatment: Gait belt Activity  Tolerance: Patient tolerated treatment well Patient left: in chair;with call bell/phone within reach     Time: 1010-1040 PT Time Calculation (min) (ACUTE ONLY): 30 min  Charges:  $Gait Training: 8-22 mins $Therapeutic Exercise: 8-22 mins                    G Codes:      Jackie Phelps 01/12/2014, 12:47 PM

## 2014-01-13 ENCOUNTER — Non-Acute Institutional Stay (SKILLED_NURSING_FACILITY): Payer: Medicare Other | Admitting: Internal Medicine

## 2014-01-13 ENCOUNTER — Other Ambulatory Visit: Payer: Self-pay | Admitting: *Deleted

## 2014-01-13 DIAGNOSIS — K5900 Constipation, unspecified: Secondary | ICD-10-CM

## 2014-01-13 DIAGNOSIS — F411 Generalized anxiety disorder: Secondary | ICD-10-CM

## 2014-01-13 DIAGNOSIS — K5909 Other constipation: Secondary | ICD-10-CM

## 2014-01-13 DIAGNOSIS — D649 Anemia, unspecified: Secondary | ICD-10-CM

## 2014-01-13 DIAGNOSIS — S72002D Fracture of unspecified part of neck of left femur, subsequent encounter for closed fracture with routine healing: Secondary | ICD-10-CM

## 2014-01-13 DIAGNOSIS — I10 Essential (primary) hypertension: Secondary | ICD-10-CM

## 2014-01-13 MED ORDER — DIAZEPAM 5 MG PO TABS: ORAL_TABLET | ORAL | Status: DC

## 2014-01-13 MED ORDER — OXYCODONE HCL 5 MG PO TABS: 5.0000 mg | ORAL_TABLET | Freq: Four times a day (QID) | ORAL | Status: DC | PRN

## 2014-01-13 NOTE — Telephone Encounter (Signed)
[facility] 

## 2014-01-13 NOTE — Progress Notes (Signed)
Patient ID: Jackie Phelps, female   DOB: 11/23/1931, 81 y.o.   MRN: [id number]   This is an acute visit.  Level care skilled.  Facility [facility].  Chief complaint-acute visit status post hospitalization for left hip fracture with repair.  History of present illness.  Patient is an 81 year old female who sustained a fall with a left displaced hip fracture.  She subsequently underwent repair of a left femoral neck fracture on 01/08/2014 and did well postoperatively.  She did have postop blood loss anemia and required a transfusion of one units of packed red blood cells.    She was seen by physical therapy for recommendation for skilled nursing facility admission.  She is on Lovenox for DVT prophylaxis.  In regards to the anemia hemoglobin dropped to 7.4 on January 6 and subsequently got a transfusion postop hemoglobin rose to 9.5.   in regards to hypertension apparently blood pressure was well controlled on Coreg and valsartan in the hospital blood pressure today 141/62 this will have to be monitored.  She also has chronic constipation is on on Senokot and MiraLAX.  She also appears to have a significant history of anxiety and is on Valium 5 mg twice a day as needed  Tonight she is complaining of hip pain she says the OxyIR does not last long enough she is on 5 mg every 6 hours as needed will increase the frequency if this.  She also is complaining somewhat of a cough.  Previous medical history.  Status post left displaced femoral neck fracture with repair.  Postop anemia status post transfusion.  Hypertension.  Chronic constipation.  Anxiety.  Hyperlipidemia.  Family medical social history as been reviewed per history and physical on 01/08/2013.  Patient quit smoking 47 years ago-no history of illicit drug use --drinks approximately 1.2 ounces of alcohol week.  Medications. Lovenox 40 mg Daily until January 24.  OxyIR 5 mg every 6 hours when  necessary.  MiraLAX daily.  Senokot twice a day.  Valium 5 mg twice a day when necessary anxiety.  Aspirin 81 mg daily.  Coreg 12.5 mg twice a day.  Lexapro 5 mg daily.  Cozaar 50 mg daily.  Multivitamin daily.  Zocor 20 mg daily  Review of systems.  General no complaints of fever or chills.  Skin does not complain of itching or rashes has some bruising around surgical site.  Head ears eyes nose mouth and throat-does not complain of sore throat or visual changes.  Respiratory  Does not complain of shortness of breath or cough  Cardiac does not complain of chest pain or palpitations.  GI does not complain of nausea vomiting diarrhea constipation or abdominal discomfort.  GU does not complain of dysuria.--No burning  Muscle skeletal is complaining of left hip pain says the OxyIR does not last quite long enough.  Neurologic does not complain of dizziness or headaches or numbness.  Psych does appear to have a history of anxiety as well as Valium when necessary.  Physical exam.  Temperature is 99.5 pulse 80 respirations 20 blood pressure 141/62.  In general this is a pleasant elderly female in no distress but a bit anxious.  Her skin is warm and dry surgical site left hip Staples are in place-there is some mild bruising around the surgical site I do not see any sign of infection there is no drainage or bleeding-there is some postop edema around the site.  Eyes pupils appear reactive light sclera and conjunctiva are clear visual  acuity appears grossly intact.  Oropharynx is clear mucous membranes moist.  Chest is clear to auscultation there is no labored breathing.  Heart is regular rate and rhythm without murmur gallop or rub she she has some quite mild left leg edema there is a positive pedal pulse without pain or erythema.  Abdomen is soft nontender with positive bowel sounds.  Musculoskeletal-moves all extremities 4 again very limited left lower extremity  secondary to recent surgery-I do not note any deformities strength appears to be intact.  Neurologic I did not appreciate lateralizing findings her speech is clear cranial nerves are grossly intact.  Psych she is alert and oriented pleasant and appropriate.  Labs.  01/12/2014.  WBC 6.4 hemoglobin 9.5 platelets 147.  01/10/2014.  Sodium 135 potassium 3.8 BUN 19 creatinine 0.94 CO2 level XXII.  Sonia Side third 2015.  Urinalysis appeared to be negative.  Chest x-ray also appeared to be negative of any acute process.  Assessment and plan.  #1-history of left hip fracture status post repair clinically appears stable but she is having some increased pain Will change OxyIR frequency to every 4 hours when necessary this will have to be monitored closely-she is on Lovenox until January 24 for anticoagulation.  #2 hypertension-systolic today is 269 this will have to be monitored she is on Coreg and losartan apparently this was fairly well controlled in hospital.  #3 chronic constipation apparently this is been stable since she's arrived she is on Senokot as well as MiraLAX.  Number 4-- anxiety she is on Valium when necessary twice a day-she does appear somewhat anxious   #5-history depression she is on low-dose Lexapro this appears to be stable.  #6 history of hyperlipidemia she is on a statin-since patient states here will be relatively short will not be aggressive in pursuing a lipid panel.  I do note patient has a temperature of 99.5 she does not appear to be overtly febrile or septic certainly-however will order a chest x-ray since she is status post postop--and at risk for some respiratory issues.--Continue to monitor vital signs pulse ox every shift  Postop anemia-apparently this rose with transfusion-will recheck a hemoglobin level as well as a basic metabolic panel.  [id number] note greater than 35 minutes spent assessing patient-reviewing her chart-addressing her concerns-and  coordinating and formulating a plan of care for numerous diagnoses-of note greater than 50% of time spent coordinating plan of care

## 2014-01-14 ENCOUNTER — Inpatient Hospital Stay (HOSPITAL_COMMUNITY): Payer: Medicare Other | Attending: Internal Medicine

## 2014-01-16 ENCOUNTER — Non-Acute Institutional Stay (SKILLED_NURSING_FACILITY): Payer: Medicare Other | Admitting: Internal Medicine

## 2014-01-16 ENCOUNTER — Other Ambulatory Visit: Payer: Self-pay

## 2014-01-16 ENCOUNTER — Encounter: Payer: Self-pay | Admitting: Internal Medicine

## 2014-01-16 DIAGNOSIS — R05 Cough: Secondary | ICD-10-CM | POA: Insufficient documentation

## 2014-01-16 DIAGNOSIS — S72002D Fracture of unspecified part of neck of left femur, subsequent encounter for closed fracture with routine healing: Secondary | ICD-10-CM

## 2014-01-16 DIAGNOSIS — F411 Generalized anxiety disorder: Secondary | ICD-10-CM | POA: Insufficient documentation

## 2014-01-16 DIAGNOSIS — R059 Cough, unspecified: Secondary | ICD-10-CM | POA: Insufficient documentation

## 2014-01-16 DIAGNOSIS — I10 Essential (primary) hypertension: Secondary | ICD-10-CM

## 2014-01-16 MED ORDER — OXYCODONE HCL 5 MG PO TABS: ORAL_TABLET | ORAL | Status: DC

## 2014-01-16 MED ORDER — OXYCODONE HCL 5 MG PO TABS: 5.0000 mg | ORAL_TABLET | ORAL | Status: DC | PRN

## 2014-01-16 NOTE — Telephone Encounter (Signed)
RX faxed to [facility] @ [phone]. Phone number [phone]  

## 2014-01-18 ENCOUNTER — Ambulatory Visit (HOSPITAL_COMMUNITY): Payer: No Typology Code available for payment source | Attending: Internal Medicine

## 2014-01-18 ENCOUNTER — Non-Acute Institutional Stay (SKILLED_NURSING_FACILITY): Payer: Medicare Other | Admitting: Internal Medicine

## 2014-01-18 DIAGNOSIS — D509 Iron deficiency anemia, unspecified: Secondary | ICD-10-CM

## 2014-01-18 DIAGNOSIS — Y33XXXA Other specified events, undetermined intent, initial encounter: Secondary | ICD-10-CM | POA: Insufficient documentation

## 2014-01-18 DIAGNOSIS — S72002A Fracture of unspecified part of neck of left femur, initial encounter for closed fracture: Secondary | ICD-10-CM | POA: Insufficient documentation

## 2014-01-18 DIAGNOSIS — S72002D Fracture of unspecified part of neck of left femur, subsequent encounter for closed fracture with routine healing: Secondary | ICD-10-CM

## 2014-01-18 NOTE — Progress Notes (Addendum)
Patient ID: Jackie Phelps, female   DOB: 22-Aug-1931, 81 y.o.   MRN: [id number]               HISTORY & PHYSICAL  DATE:  01/16/2014                            FACILITY: [facility]       LEVEL OF CARE:   SNF   CHIEF COMPLAINT:  Admission to SNF, post stay at [facility], 01/08/2014 through 01/12/2014.    HISTORY OF PRESENT ILLNESS:  This is an 81 year-old, independent woman who suffered a fall in her own home in [place], [place].  She suffered a left displaced femoral neck fracture.  She underwent a bipolar arthroplasty of the left hip.    She had postoperative anemia for which she was transfused 1 U, although she has generally done well postop with no other problems.    PAST MEDICAL HISTORY/PROBLEM LIST:                   Chronic constipation.    Hypertension.    Hyperlipidemia.    CURRENT MEDICATIONS:  Discharge medications include:        Lovenox 40 q.d.    Ensure three times daily.    Oxycodone 5 mg q.6 hours p.r.n.      MiraLAX 17 g daily.    Senokot-S 8.6 b.i.d.    ASA 81 q.d.    Coreg 12.5 b.i.d.     Lexapro 5 q.d.    Cozaar 50 q.d.    Simvastatin 20 q.d.    Valium 5 mg b.i.d. p.r.n. for anxiety.    SOCIAL HISTORY:                       HOUSING:  The patient lives in her own home in [place].  This is an older house with a large number of stairs to go upstairs or down from the main level.  There is a half-bath.   FUNCTIONAL STATUS:  The patient was independent with ADLs and IADLs prior to admission.  No ambulatory assist device.    FAMILY HISTORY:   None related by the patient.    REVIEW OF SYSTEMS:   CHEST/RESPIRATORY:  No cough.  No sputum.      CARDIAC:   No chest pain.   GI:  Some nausea and anorexia.  Significant problems with chronic constipation.   GU:  No dysuria.    PHYSICAL EXAMINATION:   VITAL SIGNS:   O2 SATURATIONS:  97% on room air.   PULSE:  82.   GENERAL APPEARANCE:   The patient is not in any distress.  Cognitively  intact, alert.   CHEST/RESPIRATORY:  Clear air entry bilaterally.   CARDIOVASCULAR:  CARDIAC:   Heart sounds are normal.  There are no murmurs.  She appears to be euvolemic.   GASTROINTESTINAL:  ABDOMEN:   Distended.  No masses.   LIVER/SPLEEN/KIDNEYS:  No liver, no spleen.   GENITOURINARY:  BLADDER:   Not distended, as far as I can tell in the chair.   CIRCULATION:     EDEMA/VARICOSITIES:  Extremities:  There is some swelling of the left leg, especially above the knee, but no evidence of a DVT.     ASSESSMENT/PLAN:               Mechanical fall with left hip fracture.  Status  post bipolar hip arthroplasty.  She has no prior history of falling and no prior history of ambulatory assist device use.  She apparently has kept up with DEXA scans and, as far as she knows, these were quite normal.    Chronic idiopathic constipation.  She has still not had a bowel movement in several days.  On MiraLAX, Senokot.    Hypertension.  This would appear to be stable.    Hyperlipidemia.  On Zocor.    Long history of anxiety.   Has always been told that she is "high-strung".  I note that she has Valium b.i.d. p.r.n.  I suspect this is what the Lexapro is for, although she specifically states she is not depressed.    The patient would appear to be an excellent candidate for rehabilitation.  Cognitively intact with no premorbid issues.

## 2014-01-18 NOTE — Progress Notes (Signed)
Patient ID: Jackie Phelps, female   DOB: [birth year], 81 y.o.   MRN: [id number]                                             FACILITY: [facility]       LEVEL OF CARE:   SNF   CHIEF COMPLAINT:  Acute visit follow-up question left leg deformity with history of left hip fracture.  Follow-up anemia.    HISTORY OF PRESENT ILLNESS:  This is an 81 year-old, independent woman who suffered a fall in her own home in [place], [place].  She suffered a left displaced femoral neck fracture.  She underwent a bipolar arthroplasty of the left hip.    She had postoperative anemia for which she was transfused 1 U, although she has generally done well postop with no other problems.  Nursing staff has noted possibly some inward rotation of her left foot-also note on lab done her hemoglobin is 9.2 which is down somewhat from 9.6 on January 9.  Currently she has no acute complaints does not complaining of increased hip pain she does have some discomfort with therapy but this is not new.  She does not complain of any chest pain fever or chills syncopal-type feelings or palpitations    PAST MEDICAL HISTORY/PROBLEM LIST:                   Chronic constipation.    Hypertension.    Hyperlipidemia.    CURRENT MEDICATIONS:  Discharge medications include:        Lovenox 40 q.d.    Ensure three times daily.    Oxycodone 5 mg q.6 hours p.r.n.      MiraLAX 17 g daily.    Senokot-S 8.6 b.i.d.    ASA 81 q.d.    Coreg 12.5 b.i.d.     Lexapro 5 q.d.    Cozaar 50 q.d.    Simvastatin 20 q.d.    Valium 5 mg b.i.d. p.r.n. for anxiety.    SOCIAL HISTORY:                       HOUSING:  The patient lives in her own home in [place].  This is an older house with a large number of stairs to go upstairs or down from the main level.  There is a half-bath.   FUNCTIONAL STATUS:  The patient was independent with ADLs and IADLs prior to admission.  No ambulatory assist device.    FAMILY HISTORY:    None related by the patient.    REVIEW OF SYSTEMS: General no complaints of fever or chills.  Skin does not complain of rashes or itching.      CHEST/RESPIRATORY:  No cough.  No sputum.      CARDIAC:   No chest pain. Palpitations   GI:  Has complained of nausea and constipation in the past but does not complain of this today.   GU:  No dysuria. Muscle skeletal is complaining of some left hip discomfort with therapy but this is not new.  Neurologic-does not complain of dizziness headache or numbness or syncope.  Psych does have a history of anxiety at this point appears to be controlled on Valium    PHYSICAL EXAMINATION:    Temperature 97.3 pulse 89 respirations 16 blood pressure 102/47 GENERAL APPEARANCE:  The patient is not in any distress.  Cognitively intact, alert.   CHEST/RESPIRATORY:  Clear air entry bilaterally.   CARDIOVASCULAR:  CARDIAC:   Heart sounds are normal.  There are no murmurs.  She appears to be euvolemic has trace lower extremity edema bilaterally.   GASTROINTESTINAL:  ABDOMEN:   Distended.  No masses. Sounds are positive   Musculoskeletal-I could not appreciate any deformity of the left hip there is dressing of the hip-there is no acute tenderness.  Possibly some slight inward rotation of the left foot but this is quite minimal this does not appear to be painful.  . Pulses intact.  Neurologic is grossly intact touch sensation appears to be intact no lateralizing findings cranial nerves intact speech is clear.  Psych she is alert and oriented history of anxiety at this point appears controlled.  Labs.  01/18/2014.  WBC 6.0 hemoglobin 9.2 platelets 336.  Sodium 136 potassium 4.2 BUN 19 creatinine 0.68.  Albumin 3.0-ALT 42 otherwise liver function tests within normal limits.  Jan-- 9 2016.  WBC 6.7 hemoglobin 9.6 platelets 226.     .     ASSESSMENT/PLAN:               Mechanical fall with left hip fracture.  Status post bipolar hip  arthroplasty. At this point appears stable however there is some concern with her left foot possible deviation will order an x-ray of the area as well as the hip to rule out any acute process-clinically she appears stable      History of postop anemia-she did receive a transfusion hemoglobin is trending slightly down-will start iron 325 mg twice a day and update a CBC on Friday, January 15 clinically she appears stable.  Of note I also note on previous x-ray which was negative for any acute process it did show apparent nodular opacity along the right lower lung likely related to right anterior fifth and sixth ribs-recommended follow-up and will order a follow-up chest x-ray for more clarity-if still some vagueness about this I suspect we will need a CT scan but clinically she appears stable does not really complain of rib pain or chest congestion shortness of breath      [phone]

## 2014-01-22 ENCOUNTER — Encounter: Payer: Self-pay | Admitting: Internal Medicine

## 2014-01-22 DIAGNOSIS — D509 Iron deficiency anemia, unspecified: Secondary | ICD-10-CM | POA: Insufficient documentation

## 2014-01-26 ENCOUNTER — Ambulatory Visit (HOSPITAL_COMMUNITY): Payer: No Typology Code available for payment source | Attending: Internal Medicine

## 2014-01-26 DIAGNOSIS — W19XXXA Unspecified fall, initial encounter: Secondary | ICD-10-CM | POA: Insufficient documentation

## 2014-01-26 DIAGNOSIS — M25552 Pain in left hip: Secondary | ICD-10-CM | POA: Insufficient documentation

## 2014-01-26 DIAGNOSIS — Z96642 Presence of left artificial hip joint: Secondary | ICD-10-CM | POA: Insufficient documentation

## 2014-03-03 ENCOUNTER — Encounter: Payer: Self-pay | Admitting: Internal Medicine

## 2014-03-03 ENCOUNTER — Non-Acute Institutional Stay (SKILLED_NURSING_FACILITY): Payer: Medicare Other | Admitting: Internal Medicine

## 2014-03-03 DIAGNOSIS — S72002D Fracture of unspecified part of neck of left femur, subsequent encounter for closed fracture with routine healing: Secondary | ICD-10-CM | POA: Diagnosis not present

## 2014-03-03 DIAGNOSIS — D509 Iron deficiency anemia, unspecified: Secondary | ICD-10-CM | POA: Diagnosis not present

## 2014-03-03 DIAGNOSIS — F411 Generalized anxiety disorder: Secondary | ICD-10-CM

## 2014-03-03 DIAGNOSIS — I10 Essential (primary) hypertension: Secondary | ICD-10-CM | POA: Diagnosis not present

## 2014-03-04 ENCOUNTER — Other Ambulatory Visit (HOSPITAL_COMMUNITY)
Admission: RE | Admit: 2014-03-04 | Discharge: 2014-03-04 | Disposition: A | Discharge status: Home / Self Care | Payer: Medicare Other | Source: Ambulatory Visit | Attending: Internal Medicine | Admitting: Internal Medicine

## 2014-03-04 LAB — CBC
HEMATOCRIT: 36 % (ref 36.0–46.0)
HEMOGLOBIN: 11.7 g/dL — AB (ref 12.0–15.0)
MCH: 29.5 pg (ref 26.0–34.0)
MCHC: 32.5 g/dL (ref 30.0–36.0)
MCV: 90.9 fL (ref 78.0–100.0)
Platelets: 183 10*3/uL (ref 150–400)
RBC: 3.96 MIL/uL (ref 3.87–5.11)
RDW: 12.6 % (ref 11.5–15.5)
WBC: 5.9 10*3/uL (ref 4.0–10.5)

## 2014-03-04 LAB — COMPREHENSIVE METABOLIC PANEL
ALBUMIN: 3.8 g/dL (ref 3.5–5.2)
ALT: 11 U/L (ref 0–35)
AST: 17 U/L (ref 0–37)
Alkaline Phosphatase: 58 U/L (ref 39–117)
Anion gap: 4 — ABNORMAL LOW (ref 5–15)
BUN: 21 mg/dL (ref 6–23)
CO2: 25 mmol/L (ref 19–32)
Calcium: 8.9 mg/dL (ref 8.4–10.5)
Chloride: 110 mmol/L (ref 96–112)
Creatinine, Ser: 0.67 mg/dL (ref 0.50–1.10)
GFR calc Af Amer: >90 mL/min (ref >90)
GFR calc non Af Amer: 81 mL/min — ABNORMAL LOW (ref >90)
Glucose, Bld: 90 mg/dL (ref 70–99)
Potassium: 3.6 mmol/L (ref 3.5–5.1)
Sodium: 139 mmol/L (ref 135–145)
Total Bilirubin: 0.4 mg/dL (ref 0.3–1.2)
Total Protein: 6.1 g/dL (ref 6.0–8.3)

## 2014-03-05 NOTE — Progress Notes (Signed)
Patient ID: Jackie Phelps, female   DOB: 1931-07-16, 81 y.o.   MRN: [id number]   Of note there was a transcription error on the patient's weight in progress note-- discharge summary-her weight actually is 113.4it is not in the 130s it is 113.4

## 2014-03-05 NOTE — Progress Notes (Signed)
Patient ID: Jackie Phelps, female   DOB: 07-Sep-1931, 81 y.o.   MRN: [id number]   This is a discharge note  Level care skilled.  Facility [facility].  Chief complaint-Discharge note.  History of present illness.  Patient is an 81 year old female who sustained a fall with a left displaced hip fracture.  She subsequently underwent repair of a left femoral neck fracture on 01/08/2014 and did well postoperatively.  She did have postop blood loss anemia and required a transfusion of one units of packed red blood cells.  She has been started on iron last hemoglobin was 9.2 we will update this    She was seen by physical therapy for recommendation for skilled nursing facility admission  She has completed a course of Lovenox and aspirin for anticoagulation-she states she was not on aspirin previously.      in regards to hypertension apparently blood pressure was well controlled on Coreg and valsartan in the hospital this appears to continue to be stable recent blood pressures 137/73 114/64 144/72  She also has chronic constipation is on on Senokot and MiraLAX.  She also appears to have a significant history of anxiety and is on Valium 2.5 mg twice a day as needed  Her only complaint this evening is some occasional vaginal itching-she is receiving a topical Nizoral cream-apparently this is providing relief-  She will be going home she will need PT and OT for further strengthening she is ambulating fairly well with her walker-also will need home health support for her multiple medical issues.  She does have supportive family including a husband.   Previous medical history.  Status post left displaced femoral neck fracture with repair.  Postop anemia status post transfusion.  Hypertension.  Chronic constipation.  Anxiety.  Hyperlipidemia.  Family medical social history a been reviewed per history and physical on 01/08/2013.  Patient quit smoking 47 years ago-no  history of illicit drug use --drinks approximately 1.2 ounces of alcohol week.  Medications.    OxyIR 5 mg every 3 hours when necessary.  MiraLAX daily.  Senokot twice a day.  Valium 2.5mg  twice a day when necessary anxiety.  Aspirin 81 mg daily.  Coreg 12.5 mg twice a day.  Lexapro 5 mg daily.  Cozaar 50 mg daily.  Multivitamin daily.  Zocor 20 mg daily  Iron 325 mg twice a day  Review of systems.  General no complaints of fever or chills.  Skin does not complain of itching or rashes   Head ears eyes nose mouth and throat-does not complain of sore throat or visual changes.  Respiratory  Does not complain of shortness of breath or cough  Cardiac does not complain of chest pain or palpitations.  GI does not complain of nausea vomiting diarrhea constipation or abdominal discomfort.  GU complains of some vaginal itching as noted above-there's been no drainage or bleeding or suprapubic discomfort  Muscle skeletal --hip pain appears to be well controlled on OxyIR  Neurologic does not complain of dizziness or headaches or numbness.  Psych does appear to have a history of anxiety as well as Valium when necessary.  Physical exam.  Temperature 97.1 pulse 90 respirations 18 blood pressure 124/72  --weight 137.1 this is again about 6 pounds since admission but has been stable for the past couple weeks  In general this is a pleasant elderly female in no distress but a bit anxious.  Her skin is warm and dry surgical site left hip Appears well-healed.  Eyes  pupils appear reactive light sclera and conjunctiva are clear visual acuity appears grossly intact.  Oropharynx is clear mucous membranes moist.  Chest is clear to auscultation there is no labored breathing.  Heart is regular rate and rhythm without murmur gallop or rub she she has some quite mild left leg edema there is a positive pedal pulse without pain or erythema.  Abdomen is soft nontender with positive  bowel sounds.   GU-could not really appreciate any drainage or significant rash possibly small residual rash but this is fairly minimal  Musculoskeletal-moves all extremities 4 --he is ambulating with a walker appears to be doing well with this   Neurologic I did not appreciate lateralizing findings her speech is clear cranial nerves are grossly intact.  Psych she is alert and oriented pleasant and appropriate.  Labs  01/18/2013.  Sodium 136 potassium 4.2 BUN 19 creatinine 0.68.  Albumin 3.0 ALT minimally elevated at 42 otherwise liver function tests within normal limits .  WBC 6.0 hemoglobin 9.2 platelets 336  .  01/12/2014.  WBC 6.4 hemoglobin 9.5 platelets 147.  01/10/2014.  Sodium 135 potassium 3.8 BUN 19 creatinine 0.94 CO2 level XXII.  Sonia Side third 2015.  Urinalysis appeared to be negative.  Chest x-ray also appeared to be negative of any acute process.  Assessment and plan.  #1-history of left hip fracture status post repair clinically appears stable--is doing well with her walker will need continued PT OT as well as home health support at home she has completed her anticoagulation with Lovenox and aspirin-states she has not been on aspirin previously   #2 hypertensionstable   she is on Coreg and losartan    #3 chronic constipation apparently this is been stable since she's arrived she is on Senokot as well as MiraLAX.  Number 4-- anxiety she is on Valium when necessary twice a day-this appears stable her dose was reduced during her stay here   #5-history depression she is on low-dose Lexapro this appears to be stable.  #6 history of hyperlipidemia she is on a statin-since patient stay here was relatively short will not be aggressive in pursuing a lipid panel.     Postop anemia-apparently this rose with transfusion-she has been started on iron Will update CBC.  #8-vaginal itching-she is receiving Nizoral cream this appears to be  helping.  [id number] note greater than 30 minutes spent on this discharge summary

## 2014-04-28 ENCOUNTER — Encounter (HOSPITAL_COMMUNITY): Payer: Self-pay | Admitting: Speech Pathology

## 2014-04-28 ENCOUNTER — Ambulatory Visit (HOSPITAL_COMMUNITY): Payer: Medicare Other | Attending: Internal Medicine | Admitting: Speech Pathology

## 2014-04-28 DIAGNOSIS — R41841 Cognitive communication deficit: Secondary | ICD-10-CM | POA: Diagnosis not present

## 2014-04-28 NOTE — Therapy (Signed)
Jackie Phelps, [name], [address] Phone: [phone]   Fax:  [phone]  Speech Language Pathology Evaluation  Patient Details  Name: Jackie Phelps MRN: [id number] Date of Birth: 1931/10/10 Referring Provider:  Margaretmary Eddy, MD  Encounter Date: 04/28/2014      End of Session - 04/28/14 1205    Visit Number 1   Number of Visits 12   Date for SLP Re-Evaluation 06/09/14   Authorization Type Medicare   Authorization - Visit Number 1   Authorization - Number of Visits 12   SLP Start Time 0930   SLP Stop Time  2979   SLP Time Calculation (min) 55 min   Activity Tolerance Patient tolerated treatment well;Other (comment)  patient limited by slef doubts      Past Medical History  Diagnosis Date  . Hypertension   . Hypercholesteremia   . Anxiety   . Constipation     Past Surgical History  Procedure Laterality Date  . Colonoscopy    . Colonoscopy  03/05/2011    Procedure: COLONOSCOPY;  Surgeon: Jackie Houston, MD;  [facility];  Service: Endoscopy;  Laterality: N/A;  1200  . Hip arthroplasty Left 01/08/2014    Procedure: ARTHROPLASTY BIPOLAR HIP;  Surgeon: Jackie Payment, MD;  Location: [facility];  Service: Orthopedics;  Laterality: Left;    There were no vitals filed for this visit.  Visit Diagnosis: Cognitive communication deficit - Plan: SLP plan of care cert/re-cert      Subjective Assessment - 04/28/14 1134    Subjective "Can't think well"   Patient is accompained by: Family member   Special Tests Informal cognitive measures   Currently in Pain? No/denies            SLP Evaluation Jackie Phelps - 04/28/14 1134    SLP Visit Information   SLP Received On 04/11/14   Onset Date Jan 2016   Medical Diagnosis Hip replacement with 2 month stay in SNF.   Subjective   Subjective Alert and Cooperative   Patient/Family Stated Goal To think better, remember things   General Information   HPI  Ms. Jackie Phelps is an 81 year old female who was referred by Dr. Sallee Phelps secondary to concerns for cognition following surgery and stay in SNF. Ms. Jackie Phelps sustained a fall with a left displaced hip fracture. She subsequently underwent repair of a left femoral neck fracture on 01/08/2014 and did well postoperatively. She had postop blood loss anemia and required a transfusion. In regards to hypertension, apparently blood pressure was well controlled on Coreg and valsartan in the hospital. She also has chronic constipation is on on Senokot and MiraLAX. She also appears to have a significant history of anxiety and is on Valium 5 mg twice a day as needed. After acute stay, Ms. Jackie Phelps was admitted to SNF for rehab from 01/12/14-03/03/14. Prior to hospitalization, Mr. Jackie Phelps was living at home with her husband. Her daughter accompanied her to this evaluation and has since moved in with her parents. 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 She reported she "has always been scattered." Ms. Jackie Phelps and her daughter reported increased difficulty with cognition and memory following stay in SNF.   Behavioral/Cognition Alert and Cooperative   Mobility Status Ambulatory   Prior Functional Status   Cognitive/Linguistic Baseline Baseline deficits   Baseline deficit details reported attention and memory difficulties.   Type of Home House  Lives With Spouse;Daughter   Available Support Family;Friend(s)   Education B.S. in business   Vocation Unemployed   Cognition   Overall Cognitive Status Impaired/Different from baseline   Area of Impairment Orientation;Attention;Memory;Awareness;Problem solving   Orientation Level Place;Time;Situation   General Comments General orientation intact, difficulty with specifics   Current Attention Level Sustained   Attention Comments --   Memory Decreased short-term memory   Memory Comments --   Awareness Emergent   Awareness Comments suspected decreased  appreciateion for current deficits.   Problem Solving Slow processing;Decreased initiation;Difficulty sequencing;Requires verbal cues   Problem Solving Comments Impaired   Attention Sustained   Sustained Attention Impaired   Sustained Attention Impairment Verbal complex;Functional complex   Memory Impaired   Memory Impairment Retrieval deficit;Decreased short term memory;Decreased recall of new information   Decreased Short Term Memory Verbal basic;Verbal complex   Awareness Impaired   Awareness Impairment Emergent impairment   Problem Solving Impaired   Problem Solving Impairment Verbal complex;Functional complex   Merchandiser, retail;Initiating   Reasoning Impaired   Reasoning Impairment Verbal complex   Organizing Impaired   Organizing Impairment Functional complex;Verbal complex   Decision Making Appears intact   Initiating Appears intact   Behaviors Poor frustration tolerance;Other (comment)  low confidence in skills, unwillingness to attempt   Auditory Comprehension   Overall Auditory Comprehension Appears within functional limits for tasks assessed   Yes/No Questions Within Functional Limits   Commands Within Functional Limits   Conversation Complex   Visual Recognition/Discrimination   Discrimination Within Function Limits   Reading Comprehension   Reading Status Within funtional limits   Expression   Primary Mode of Expression Verbal   Verbal Expression   Overall Verbal Expression Appears within functional limits for tasks assessed   Initiation No impairment   Level of Generative/Spontaneous Verbalization Conversation   Repetition No impairment   Naming Impairment   Responsive Not tested   Confrontation 75-100% accurate   Convergent Not tested   Divergent 50-74% accurate   Pragmatics No impairment   Written Expression   Written Expression Within Functional Limits   Oral Motor/Sensory Function   Overall Oral Motor/Sensory  Function Appears within functional limits for tasks assessed   Labial ROM Within Functional Limits   Labial Strength Within Functional Limits   Labial Sensation Within Functional Limits   Labial Coordination WFL   Lingual ROM Within Functional Limits   Lingual Symmetry Within Functional Limits   Lingual Strength Within Functional Limits   Lingual Sensation Within Functional Limits   Lingual Coordination WFL   Facial ROM Within Functional Limits   Facial Symmetry Within Functional Limits   Facial Strength Within Functional Limits   Facial Sensation Within Functional Limits   Facial Coordination WFL   Velum Within Functional Limits   Mandible Within Functional Limits   Motor Speech   Overall Motor Speech Appears within functional limits for tasks assessed   Respiration Within functional limits   Phonation Normal   Resonance Within functional limits   Articulation Within functional limitis   Intelligibility Intelligible   Motor Planning Witnin functional limits   Motor Speech Errors Not applicable   Standardized Assessments   Standardized Assessments  Montreal Cognitive Assessment [facility])   Montreal Cognitive Assessment ([place])  16/30              SLP Education - 04/28/14 1204    Education provided Yes   Education Details Discussed genral deficits noted.   Person(s) Educated Patient;Child(ren)   Methods  Explanation   Comprehension Verbalized understanding          SLP Short Term Goals - 05-07-14 1216    SLP SHORT TERM GOAL #1   Title Pt will increase divergent naming to 10 items per concrete category with min cues.    Baseline 6   Time 6   Period Weeks   Status New   SLP SHORT TERM GOAL #2   Title Pt ill comlete functional memory activities with 90% accuracy with given min cues and use of strategies.   Baseline 60%.   Time 6   Period Weeks   Status New   SLP SHORT TERM GOAL #3   Title Pt will complete mod level thought organization/planning tasks with 90% acc  when given mild-mod cues.   Baseline 40%   Time 6   Period Weeks   Status New   SLP SHORT TERM GOAL #4   Title Pt will show orientation to specific time/events/palces by answering questions regarding personal details with 90% acc when given min cues.   Baseline 70%.   Time 6   Period Weeks   Status New          SLP Long Term Goals - 05/07/2014 1221    SLP LONG TERM GOAL #1   Title Increase executive skills to [facility] with use of strategies as needed.   Baseline mod impairment   Time 6   Period Weeks   Status New   SLP LONG TERM GOAL #2   Title Express moderately complex thoughts/feeling in familar environment with use of compensatory strategies.   Baseline mild impairment   Time 6   Period Weeks   Status New          Plan - 05/07/2014 1207    Clinical Impression Statement Ms. Collums presents with mod cognitive impairment. Pt demonstrated decreased immediate and delayed recall of new information as well as decreased awareness of deficits. Verbal expression is fluent but difficulty was shown on divergent naming tasks. Problem solving and reasoning also showed deficits. Pt and caregivers report that Ms. Szwed is showing a more difficult time with memory, planning, and attention since hip surgery and rehab stay at [facility]. Daughter reports that orientation and problem solving decrease significantly when external stressors are added to situations. Deficits are exacerbated by emotional state-unwilling to attempt tasks perceived as difficult, belittling own abilities. Daughter reports they feel Pt is depressed. Given these findings, Ms. Romano would benefit from outpatient speech therapy to address cognitive skills to increase safety and quality of life.    Speech Therapy Frequency 2x / week   Duration Other (comment)  6 weeks   Treatment/Interventions Compensatory techniques;Cueing hierarchy;Cognitive reorganization;Internal/external aids;Functional tasks;SLP instruction and  feedback;Compensatory strategies;Patient/family education   Potential to Achieve Goals Fair   Potential Considerations Cooperation/participation level;Previous level of function   SLP Home Exercise Plan HEP will be implemented to faciliate carryover of skills.   Consulted and Agree with Plan of Care Patient;Family member/caregiver   Family Member Consulted Daughter          G-Codes - 2014/05/07 1225    Functional Assessment Tool Used MOCA assessment   Functional Limitations Memory   Attention Current Status [phone]) At least 20 percent but less than 40 percent impaired, limited or restricted   Attention Goal Status (X9147) At least 1 percent but less than 20 percent impaired, limited or restricted   Memory Current Status (W2956) At least 40 percent but less than 60 percent impaired, limited or  restricted   Memory Goal Status [phone]) At least 1 percent but less than 20 percent impaired, limited or restricted      Problem List Patient Active Problem List   Diagnosis Date Noted  . Iron deficiency anemia 01/22/2014  . Anxiety state 01/16/2014  . Cough 01/16/2014  . Left displaced femoral neck fracture 01/08/2014  . Chronic constipation 02/03/2011  . Hypertension 02/03/2011   Thank you, Sherlynn Stalls, M.S., [email or web address] [phone]    Larence Penning 04/28/2014, 12:32 PM  [facility] [address] [place], [name], [address] Phone: [phone]   Fax:  [phone]

## 2014-05-09 ENCOUNTER — Ambulatory Visit (HOSPITAL_COMMUNITY): Payer: Medicare Other | Attending: Internal Medicine | Admitting: Speech Pathology

## 2014-05-09 DIAGNOSIS — R41841 Cognitive communication deficit: Secondary | ICD-10-CM | POA: Diagnosis present

## 2014-05-09 DIAGNOSIS — M25652 Stiffness of left hip, not elsewhere classified: Secondary | ICD-10-CM | POA: Diagnosis not present

## 2014-05-09 NOTE — Therapy (Signed)
[facility] [place], [name], [address] Phone: [phone]   Fax:  [phone]  Speech Language Pathology Treatment  Patient Details  Name: ZOIE SARIN MRN: [id number] Date of Birth: [birth year] Referring Provider:  Margaretmary Eddy, MD  Encounter Date: 05/09/2014      End of Session - 05/09/14 1807    Visit Number 2   Number of Visits 12   Date for SLP Re-Evaluation 06/27/14   Authorization Type Medicare   Authorization Time Period 04/28/2014-06/27/2014   Authorization - Visit Number 2   Authorization - Number of Visits 12   SLP Start Time 4496   SLP Stop Time  1525   SLP Time Calculation (min) 47 min   Activity Tolerance Patient tolerated treatment well;Other (comment)  patient limited by self doubts      Past Medical History  Diagnosis Date  . Hypertension   . Hypercholesteremia   . Anxiety   . Constipation     Past Surgical History  Procedure Laterality Date  . Colonoscopy    . Colonoscopy  03/05/2011    Procedure: COLONOSCOPY;  Surgeon: Rogene Houston, MD;  [facility];  Service: Endoscopy;  Laterality: N/A;  1200  . Hip arthroplasty Left 01/08/2014    Procedure: ARTHROPLASTY BIPOLAR HIP;  Surgeon: Marianna Payment, MD;  Location: [facility];  Service: Orthopedics;  Laterality: Left;    There were no vitals filed for this visit.  Visit Diagnosis: Cognitive communication deficit      Subjective Assessment - 05/09/14 1804    Subjective "I don't know how to do that kind of thing."   Patient is accompained by: Family member   Currently in Pain? No/denies               ADULT SLP TREATMENT - 05/09/14 1804    General Information   Behavior/Cognition Alert;Cooperative;Pleasant mood  tangential?   Patient Positioning Upright in chair   Oral care provided N/A   HPI Ms. Chasity Outten is an 81 year old female who was referred by Dr. Sallee Provencal secondary to concerns for cognition  following surgery and stay in SNF. Ms. Heino sustained a fall with a left displaced hip fracture. She subsequently underwent repair of a left femoral neck fracture on 01/08/2014 and did well postoperatively. She had postop blood loss anemia and required a transfusion. In regards to hypertension, apparently blood pressure was well controlled on Coreg and valsartan in the hospital. She also has chronic constipation is on on Senokot and MiraLAX. She also appears to have a significant history of anxiety and is on Valium 5 mg twice a day as needed. After acute stay, Ms. Kuyper was admitted to SNF for rehab from 01/12/14-03/03/14. Prior to hospitalization, Mr. Bossi was living at home with her husband. Her daughter accompanied her to this evaluation and has since moved in with her parents. Ms. Edge has a B.S. 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 She reported she "has always been scattered." Ms. Mcclain and her daughter reported increased difficulty with cognition and memory following stay in SNF.   Treatment Provided   Treatment provided Cognitive-Linquistic   Pain Assessment   Pain Assessment No/denies pain   Cognitive-Linquistic Treatment   Treatment focused on Patient/family/caregiver education;Cognition   Skilled Treatment Memory skills and strategies, attention   Assessment / Recommendations / Plan   Plan Continue with current plan of care          SLP Education -  05/09/14 1806    Education provided Yes   Education Details Memory strategies handout   Person(s) Educated Patient;Spouse;Child(ren)   Methods Explanation;Handout   Comprehension Verbalized understanding;Need further instruction          SLP Short Term Goals - 05/09/14 1819    SLP SHORT TERM GOAL #1   Title Pt will increase divergent naming to 10 items per concrete category with min cues.    Baseline 6   Time 6   Period Weeks   Status On-going   SLP SHORT TERM GOAL #2   Title Pt ill comlete functional memory activities with  90% accuracy with given min cues and use of strategies.   Baseline 60%.   Time 6   Period Weeks   Status On-going   SLP SHORT TERM GOAL #3   Title Pt will complete mod level thought organization/planning tasks with 90% acc when given mild-mod cues.   Baseline 40%   Time 6   Period Weeks   Status On-going   SLP SHORT TERM GOAL #4   Title Pt will show orientation to specific time/events/palces by answering questions regarding personal details with 90% acc when given min cues.   Baseline 70%.   Time 6   Period Weeks   Status On-going          SLP Long Term Goals - 05/09/14 1819    SLP LONG TERM GOAL #1   Title Increase executive skills to [facility] with use of strategies as needed.   Baseline mod impairment   Time 6   Period Weeks   Status On-going   SLP LONG TERM GOAL #2   Title Express moderately complex thoughts/feeling in familar environment with use of compensatory strategies.   Baseline mild impairment   Time 6   Period Weeks   Status On-going          Plan - 05/09/14 1819    Clinical Impression Statement Mrs. Adolf was seen in treatment room while her daughter Raquel Sarna) and husband Clair Gulling) remained in waiting room. Mrs. Lemire reports improved cognitive function since her initial evaluation, however she states that her family still thinks she is "having problems". She is able to recall very specific details about her hospitalization and stay at SNF when discussing in casual conversation, however when pressed for information she verbalizes, "I can't... I've never been good at that". She needed gentle encouragement to participate in 10-item word association, recognition, and recall task, but actually did very well on the first trial (remembering 8/10 words). Interestingly, her performance decreased with repetition of task (same list of words repeated x 4) with a final of 6/10 and no free recall of any of the words. She frequently stated, "I wouldn't know... I would just have to  guess.Marland KitchenMarland KitchenIt's not (xyz) is it?" but then stated the correct word. She was given a list of memory strategies to review at home and we will address further next session. She was given a folder to keep her HEP and was asked to bring to each session (along with her planner). Family asked about receiving PT/OT here and request was faxed to her doctor in [place]. Continue POC.    Speech Therapy Frequency 2x / week   Duration Other (comment)  6 weeks   Treatment/Interventions Compensatory techniques;Cueing hierarchy;Cognitive reorganization;Internal/external aids;Functional tasks;SLP instruction and feedback;Compensatory strategies;Patient/family education   Potential to Achieve Goals Fair   Potential Considerations Cooperation/participation level;Previous level of function   SLP Home Exercise Plan HEP will be implemented  to faciliate carryover of skills.   Consulted and Agree with Plan of Care Patient;Family member/caregiver   Family Member Consulted Daughter        Problem List Patient Active Problem List   Diagnosis Date Noted  . Iron deficiency anemia 01/22/2014  . Anxiety state 01/16/2014  . Cough 01/16/2014  . Left displaced femoral neck fracture 01/08/2014  . Chronic constipation 02/03/2011  . Hypertension 02/03/2011   Thank you,  Genene Churn, [place]  Genene Churn 05/09/2014, 6:20 PM  [facility] [address] [place], [name], [address] Phone: [phone]   Fax:  [phone]

## 2014-05-11 ENCOUNTER — Ambulatory Visit (HOSPITAL_COMMUNITY): Payer: Medicare Other | Admitting: Speech Pathology

## 2014-05-11 DIAGNOSIS — R41841 Cognitive communication deficit: Secondary | ICD-10-CM | POA: Diagnosis not present

## 2014-05-11 NOTE — Therapy (Signed)
[place], [name], [address] Phone: [phone]   Fax:  [phone]  Speech Language Pathology Treatment  Patient Details  Name: Jackie Phelps MRN: [id number] Date of Birth: June 29, 1931 Referring Provider:  Doree Albee, MD  Encounter Date: 05/11/2014      End of Session - 05/11/14 1708    Visit Number 3   Number of Visits 12   Date for SLP Re-Evaluation 06/27/14   Authorization Type Medicare   Authorization Time Period 04/28/2014-06/27/2014   Authorization - Visit Number 3   Authorization - Number of Visits 12   SLP Start Time 8938   SLP Stop Time  1017   SLP Time Calculation (min) 60 min   Activity Tolerance Patient tolerated treatment well;Other (comment)  patient limited by self doubts      Past Medical History  Diagnosis Date  . Hypertension   . Hypercholesteremia   . Anxiety   . Constipation     Past Surgical History  Procedure Laterality Date  . Colonoscopy    . Colonoscopy  03/05/2011    Procedure: COLONOSCOPY;  Surgeon: Rogene Houston, MD;  [facility];  Service: Endoscopy;  Laterality: N/A;  1200  . Hip arthroplasty Left 01/08/2014    Procedure: ARTHROPLASTY BIPOLAR HIP;  Surgeon: Marianna Payment, MD;  Location: [facility];  Service: Orthopedics;  Laterality: Left;    There were no vitals filed for this visit.  Visit Diagnosis: Cognitive communication deficit      Subjective Assessment - 05/11/14 1707    Subjective "I think I am doing better. I can do everything myself but my daughter does it for me."   Patient is accompained by: Family member   Currently in Pain? No/denies               ADULT SLP TREATMENT - 05/11/14 1708    General Information   Behavior/Cognition Alert;Cooperative;Pleasant mood   Patient Positioning Upright in chair   Oral care provided N/A   HPI Ms. Jackie Phelps is an 81 year old female who was referred by Dr. Sallee Provencal  secondary to concerns for cognition following surgery and stay in SNF. Ms. Jackie Phelps sustained a fall with a left displaced hip fracture. She subsequently underwent repair of a left femoral neck fracture on 01/08/2014 and did well postoperatively. She had postop blood loss anemia and required a transfusion. In regards to hypertension, apparently blood pressure was well controlled on Coreg and valsartan in the hospital. She also has chronic constipation is on on Senokot and MiraLAX. She also appears to have a significant history of anxiety and is on Valium 5 mg twice a day as needed. After acute stay, Ms. Jackie Phelps was admitted to SNF for rehab from 01/12/14-03/03/14. Prior to hospitalization, Mr. Jackie Phelps was living at home with her husband. Her daughter accompanied her to this evaluation and has since moved in with her parents. 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 She reported she "has always been scattered." Ms. Jackie Phelps and her daughter reported increased difficulty with cognition and memory following stay in SNF.   Treatment Provided   Treatment provided Cognitive-Linquistic   Pain Assessment   Pain Assessment No/denies pain   Cognitive-Linquistic Treatment   Treatment focused on Patient/family/caregiver education;Cognition   Skilled Treatment Memory skills and strategies, attention   Assessment / Recommendations / Plan   Plan Continue with current plan of care  SLP Short Term Goals - 05/11/14 1709    SLP SHORT TERM GOAL #1   Title Pt will increase divergent naming to 10 items per concrete category with min cues.    Baseline 6   Time 6   Period Weeks   Status On-going   SLP SHORT TERM GOAL #2   Title Pt ill comlete functional memory activities with 90% accuracy with given min cues and use of strategies.   Baseline 60%.   Time 6   Period Weeks   Status On-going   SLP SHORT TERM GOAL #3   Title Pt will complete mod level thought organization/planning tasks with 90%  acc when given mild-mod cues.   Baseline 40%   Time 6   Period Weeks   Status On-going   SLP SHORT TERM GOAL #4   Title Pt will show orientation to specific time/events/palces by answering questions regarding personal details with 90% acc when given min cues.   Baseline 70%.   Time 6   Period Weeks   Status On-going          SLP Long Term Goals - 05/11/14 1709    SLP LONG TERM GOAL #1   Title Increase executive skills to [facility] with use of strategies as needed.   Baseline mod impairment   Time 6   Period Weeks   Status On-going   SLP LONG TERM GOAL #2   Title Express moderately complex thoughts/feeling in familar environment with use of compensatory strategies.   Baseline mild impairment   Time 6   Period Weeks   Status On-going          Plan - 05/11/14 1709    Clinical Impression Statement [name] was seen in treatment room while her daughter Jackie Phelps) remained in waiting room. She brought her calendar to session as requested by SLP last session and it was reviewed together. Her daughter has done and excellent job of recording all appointments and important events in her calendar (past, present, and future). She has color coded certain appointments with highlighter which makes it very easy to read/navigate. [name] was able to locate information with min cues from SLP. Her memory deficits are difficult to pinpoint, as she was able to tell me very specific details about the past few days and had no difficulty with word finding or naming during conversation, but she repeated herself on 4 occasions by telling SLP about MD appointment her husband had with no awareness that she was repeating herself. Would like to further assess cognitive skills perhaps via a dementia battery. Continue POC.    Speech Therapy Frequency 2x / week   Duration Other (comment)  6 weeks   Treatment/Interventions Compensatory techniques;Cueing hierarchy;Cognitive reorganization;Internal/external  aids;Functional tasks;SLP instruction and feedback;Compensatory strategies;Patient/family education   Potential to Achieve Goals Fair   Potential Considerations Cooperation/participation level;Previous level of function   SLP Home Exercise Plan HEP will be implemented to faciliate carryover of skills.   Consulted and Agree with Plan of Care Patient;Family member/caregiver   Family Member Consulted Daughter        Problem List Patient Active Problem List   Diagnosis Date Noted  . Iron deficiency anemia 01/22/2014  . Anxiety state 01/16/2014  . Cough 01/16/2014  . Left displaced femoral neck fracture 01/08/2014  . Chronic constipation 02/03/2011  . Hypertension 02/03/2011   Thank you,  Genene Churn, [place]  [place] 05/11/2014, 5:10 PM  [facility] [address]  [place], [name], [address] Phone: [phone]   Fax:  [phone]

## 2014-05-16 ENCOUNTER — Ambulatory Visit (HOSPITAL_COMMUNITY): Payer: Medicare Other | Admitting: Speech Pathology

## 2014-05-16 DIAGNOSIS — R41841 Cognitive communication deficit: Secondary | ICD-10-CM | POA: Diagnosis not present

## 2014-05-16 NOTE — Therapy (Signed)
[place], [name], [address] Phone: [phone]   Fax:  [phone]  Speech Language Pathology Treatment  Patient Details  Name: Jackie Phelps MRN: [id number] Date of Birth: [birth year] Referring Provider:  Margaretmary Eddy, MD  Encounter Date: 05/16/2014      End of Session - 05/16/14 1920    Visit Number 4   Number of Visits 12   Date for SLP Re-Evaluation 06/27/14   Authorization Type Medicare   Authorization Time Period 04/28/2014-06/27/2014   Authorization - Visit Number 4   Authorization - Number of Visits 12   SLP Start Time 0998   SLP Stop Time  1600   SLP Time Calculation (min) 90 min   Activity Tolerance Patient tolerated treatment well;Other (comment)  patient limited by self doubts      Past Medical History  Diagnosis Date  . Hypertension   . Hypercholesteremia   . Anxiety   . Constipation     Past Surgical History  Procedure Laterality Date  . Colonoscopy    . Colonoscopy  03/05/2011    Procedure: COLONOSCOPY;  Surgeon: Rogene Houston, MD;  [facility];  Service: Endoscopy;  Laterality: N/A;  1200  . Hip arthroplasty Left 01/08/2014    Procedure: ARTHROPLASTY BIPOLAR HIP;  Surgeon: Marianna Payment, MD;  Location: [facility];  Service: Orthopedics;  Laterality: Left;    There were no vitals filed for this visit.  Visit Diagnosis: Cognitive communication deficit      Subjective Assessment - 05/16/14 1918    Subjective "You are going to have to give me some harder homework."   Patient is accompained by: Family member   Currently in Pain? No/denies               ADULT SLP TREATMENT - 05/16/14 0001    General Information   Behavior/Cognition Alert;Cooperative;Pleasant mood   Patient Positioning Upright in chair   Oral care provided N/A   HPI Jackie Phelps is an 81 year old female who was referred by Dr. Sallee Provencal secondary to concerns for cognition  following surgery and stay in SNF. Jackie Phelps sustained a fall with a left displaced hip fracture. She subsequently underwent repair of a left femoral neck fracture on 01/08/2014 and did well postoperatively. She had postop blood loss anemia and required a transfusion. In regards to hypertension, apparently blood pressure was well controlled on Coreg and valsartan in the hospital. She also has chronic constipation is on on Senokot and MiraLAX. She also appears to have a significant history of anxiety and is on Valium 5 mg twice a day as needed. After acute stay, Jackie Phelps was admitted to SNF for rehab from 01/12/14-03/03/14. Prior to hospitalization, Mr. Leaf was living at home with her husband. Her daughter accompanied her to this evaluation and has since moved in with her parents. 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 She reported she "has always been scattered." [name] and her daughter reported increased difficulty with cognition and memory following stay in SNF.   Treatment Provided   Treatment provided Cognitive-Linquistic   Pain Assessment   Pain Assessment No/denies pain   Cognitive-Linquistic Treatment   Treatment focused on Patient/family/caregiver education;Cognition   Skilled Treatment Memory skills and strategies, attention   Assessment / Recommendations / Plan   Plan Continue with current plan of care            SLP Short  Term Goals - 05/16/14 1922    SLP SHORT TERM GOAL #1   Title Pt will increase divergent naming to 10 items per concrete category with min cues.    Baseline 6   Time 6   Period Weeks   Status On-going   SLP SHORT TERM GOAL #2   Title Pt ill comlete functional memory activities with 90% accuracy with given min cues and use of strategies.   Baseline 60%.   Time 6   Period Weeks   Status On-going   SLP SHORT TERM GOAL #3   Title Pt will complete mod level thought organization/planning tasks with 90% acc when given mild-mod cues.    Baseline 40%   Time 6   Period Weeks   Status On-going   SLP SHORT TERM GOAL #4   Title Pt will show orientation to specific time/events/palces by answering questions regarding personal details with 90% acc when given min cues.   Baseline 70%.   Time 6   Period Weeks   Status On-going          SLP Long Term Goals - 05/16/14 1922    SLP LONG TERM GOAL #1   Title Increase executive skills to [facility] with use of strategies as needed.   Baseline mod impairment   Time 6   Period Weeks   Status On-going   SLP LONG TERM GOAL #2   Title Express moderately complex thoughts/feeling in familar environment with use of compensatory strategies.   Baseline mild impairment   Time 6   Period Weeks   Status On-going          Plan - 05/16/14 1920    Clinical Impression Statement [name] completed [facility] with 100% acc. Her daughter, Raquel Sarna, bought her a new notebook to use in conjunction with her [employer] account executive. Pt reports that she's "getting better" and doesn't really see the need for therapy. When presented with hypothetical problem solving/planning task, she states that she's "never been good at this kind of thing", but was able to complete with guidance from SLP. Her daughter attended the last part of the treatment session to discuss treatment plan. She was excited about my suggestions for organizing her new notebook with names of friends, family, doctors, restaurants, etc. Will continue next session.   Speech Therapy Frequency 2x / week   Duration Other (comment)  6 weeks   Treatment/Interventions Compensatory techniques;Cueing hierarchy;Cognitive reorganization;Internal/external aids;Functional tasks;SLP instruction and feedback;Compensatory strategies;Patient/family education   Potential to Achieve Goals Fair   Potential Considerations Cooperation/participation level;Previous level of function   SLP Home Exercise Plan HEP will be implemented to faciliate carryover of skills.   Consulted  and Agree with Plan of Care Patient;Family member/caregiver   Family Member Consulted Daughter        Problem List Patient Active Problem List   Diagnosis Date Noted  . Iron deficiency anemia 01/22/2014  . Anxiety state 01/16/2014  . Cough 01/16/2014  . Left displaced femoral neck fracture 01/08/2014  . Chronic constipation 02/03/2011  . Hypertension 02/03/2011   Thank you,  Genene Churn, [place]  [facility] 05/16/2014, 7:26 PM  [facility] [address] [place], [name], [address] Phone: [phone]   Fax:  [phone]

## 2014-05-18 ENCOUNTER — Ambulatory Visit (HOSPITAL_COMMUNITY): Payer: Medicare Other | Admitting: Physical Therapy

## 2014-05-18 ENCOUNTER — Ambulatory Visit (HOSPITAL_COMMUNITY): Payer: Medicare Other

## 2014-05-18 ENCOUNTER — Ambulatory Visit (HOSPITAL_COMMUNITY): Payer: Medicare Other | Admitting: Speech Pathology

## 2014-05-18 DIAGNOSIS — R41841 Cognitive communication deficit: Secondary | ICD-10-CM | POA: Diagnosis not present

## 2014-05-18 DIAGNOSIS — R269 Unspecified abnormalities of gait and mobility: Secondary | ICD-10-CM

## 2014-05-18 DIAGNOSIS — Z9181 History of falling: Secondary | ICD-10-CM

## 2014-05-18 DIAGNOSIS — R29898 Other symptoms and signs involving the musculoskeletal system: Secondary | ICD-10-CM

## 2014-05-18 DIAGNOSIS — M25652 Stiffness of left hip, not elsewhere classified: Secondary | ICD-10-CM

## 2014-05-18 NOTE — Patient Instructions (Signed)
ABDUCTION: Standing (Active)   Stand, feet flat. Lift right leg out to side. Use ___ lbs. Complete ___ sets of ___ repetitions. Perform ___ sessions per day.  http://gtsc.exer.us/111   Copyright  VHI. All rights reserved.  Iliotibial Band (Stand)   Stand with feet crossed. Tilt left shoulder toward floor. Do not rotate trunk, only tilt. Keep feet flat on floor. Hold ____ seconds. Repeat ____ times. Do ____ sessions per day. CAUTION: Stretch should be gentle, steady and slow.  Copyright  VHI. All rights reserved.

## 2014-05-18 NOTE — Therapy (Signed)
[place], [name], [address] Phone: [phone]   Fax:  [phone]  Speech Language Pathology Treatment  Patient Details  Name: Jackie Phelps MRN: [id number] Date of Birth: [birth year] Referring Provider:  Doree Albee, MD  Encounter Date: 05/18/2014      End of Session - 05/18/14 1619    Visit Number 5   Number of Visits 12   Date for SLP Re-Evaluation 06/27/14   Authorization Type Medicare   Authorization Time Period 04/28/2014-06/27/2014   Authorization - Visit Number 5   Authorization - Number of Visits 12   SLP Start Time 7867   SLP Stop Time  6720   SLP Time Calculation (min) 45 min   Activity Tolerance Patient tolerated treatment well;Other (comment)  patient limited by self doubts      Past Medical History  Diagnosis Date  . Hypertension   . Hypercholesteremia   . Anxiety   . Constipation     Past Surgical History  Procedure Laterality Date  . Colonoscopy    . Colonoscopy  03/05/2011    Procedure: COLONOSCOPY;  Surgeon: Rogene Houston, MD;  [facility];  Service: Endoscopy;  Laterality: N/A;  1200  . Hip arthroplasty Left 01/08/2014    Procedure: ARTHROPLASTY BIPOLAR HIP;  Surgeon: Marianna Payment, MD;  Location: [facility];  Service: Orthopedics;  Laterality: Left;    There were no vitals filed for this visit.  Visit Diagnosis: Cognitive communication deficit      Subjective Assessment - 05/18/14 1617    Subjective "Oh I don't need all this!"   Patient is accompained by: Family member   Currently in Pain? No/denies               ADULT SLP TREATMENT - 05/18/14 1618    General Information   Behavior/Cognition Alert;Cooperative;Pleasant mood   Patient Positioning Upright in chair   Oral care provided N/A   HPI Ms. Jackie Phelps is an 81 year old female who was referred by Dr. Sallee Provencal secondary to concerns for cognition following surgery and stay in  SNF. Ms. Poe sustained a fall with a left displaced hip fracture. She subsequently underwent repair of a left femoral neck fracture on 01/08/2014 and did well postoperatively. She had postop blood loss anemia and required a transfusion. In regards to hypertension, apparently blood pressure was well controlled on Coreg and valsartan in the hospital. She also has chronic constipation is on on Senokot and MiraLAX. She also appears to have a significant history of anxiety and is on Valium 5 mg twice a day as needed. After acute stay, Ms. Jackie Phelps was admitted to SNF for rehab from 01/12/14-03/03/14. Prior to hospitalization, Mr. Lavery was living at home with her husband. Her daughter accompanied her to this evaluation and has since moved in with her parents. 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 She reported she "has always been scattered." Ms. Jackie Phelps and her daughter reported increased difficulty with cognition and memory following stay in SNF.   Treatment Provided   Treatment provided Cognitive-Linquistic   Pain Assessment   Pain Assessment No/denies pain   Cognitive-Linquistic Treatment   Treatment focused on Patient/family/caregiver education;Cognition   Skilled Treatment Memory skills and strategies, attention   Assessment / Recommendations / Plan   Plan Continue with current plan of care            SLP Short Term Goals - 05/18/14  [place] #1   Title Pt will increase divergent naming to 10 items per concrete category with min cues.    Baseline 6   Time 6   Period Weeks   Status On-going   SLP SHORT TERM GOAL #2   Title Pt ill comlete functional memory activities with 90% accuracy with given min cues and use of strategies.   Baseline 60%.   Time 6   Period Weeks   Status On-going   SLP SHORT TERM GOAL #3   Title Pt will complete mod level thought organization/planning tasks with 90% acc when given mild-mod cues.   Baseline 40%   Time 6   Period  Weeks   Status On-going   SLP SHORT TERM GOAL #4   Title Pt will show orientation to specific time/events/palces by answering questions regarding personal details with 90% acc when given min cues.   Baseline 70%.   Time 6   Period Weeks   Status On-going          SLP Long Term Goals - 05/18/14 1623    SLP LONG TERM GOAL #1   Title Increase executive skills to [facility] with use of strategies as needed.   Baseline mod impairment   Time 6   Period Weeks   Status On-going   SLP LONG TERM GOAL #2   Title Express moderately complex thoughts/feeling in familar environment with use of compensatory strategies.   Baseline mild impairment   Time 6   Period Weeks   Status On-going          Plan - 05/18/14 1619    Clinical Impression Statement [name] brought in another notebook with dividers as created by her daughter. The patient felt like the book was too big and she would never carry it around. I do think that it is larger than she needs, but could be used if just left at home. Pt was able to list names of doctors, other service providers, and stores she frequents with mild/moderate cues. SHe frequently stated that she didn't see the point in all this, but was agreeable to participate. Daughter was shown new items to add and will discuss next session. She was given another handout on memory tips to review at home.    Speech Therapy Frequency 2x / week   Duration Other (comment)  6 weeks   Treatment/Interventions Compensatory techniques;Cueing hierarchy;Cognitive reorganization;Internal/external aids;Functional tasks;SLP instruction and feedback;Compensatory strategies;Patient/family education   Potential to Achieve Goals Fair   Potential Considerations Cooperation/participation level;Previous level of function   SLP Home Exercise Plan HEP will be implemented to faciliate carryover of skills.   Consulted and Agree with Plan of Care Patient;Family member/caregiver   Family Member  Consulted Daughter        Problem List Patient Active Problem List   Diagnosis Date Noted  . Iron deficiency anemia 01/22/2014  . Anxiety state 01/16/2014  . Cough 01/16/2014  . Left displaced femoral neck fracture 01/08/2014  . Chronic constipation 02/03/2011  . Hypertension 02/03/2011   Thank you,  Genene Churn, [place]  [facility] 05/18/2014, 4:24 PM  [place], [name], [address] Phone: [phone]   Fax:  [phone]

## 2014-05-18 NOTE — Therapy (Signed)
[place], [name], [address] Phone: [phone]   Fax:  [phone]  Physical Therapy Evaluation  Patient Details  Name: Jackie Phelps MRN: [id number] Date of Birth: 1931-03-10 Referring Provider:  Doree Albee, MD  Encounter Date: 05/18/2014      PT End of Session - 05/18/14 1635    Visit Number 1   Number of Visits 8   Date for PT Re-Evaluation 06/17/14   Authorization Type Medicare   Authorization - Visit Number 1   Authorization - Number of Visits 10   PT Start Time 6962   PT Stop Time 1620   PT Time Calculation (min) 65 min   Activity Tolerance Patient tolerated treatment well;Other (comment)   Behavior During Therapy WFL for tasks assessed/performed      Past Medical History  Diagnosis Date  . Hypertension   . Hypercholesteremia   . Anxiety   . Constipation     Past Surgical History  Procedure Laterality Date  . Colonoscopy    . Colonoscopy  03/05/2011    Procedure: COLONOSCOPY;  Surgeon: Rogene Houston, MD;  [facility];  Service: Endoscopy;  Laterality: N/A;  1200  . Hip arthroplasty Left 01/08/2014    Procedure: ARTHROPLASTY BIPOLAR HIP;  Surgeon: Marianna Payment, MD;  Location: [facility];  Service: Orthopedics;  Laterality: Left;    There were no vitals filed for this visit.  Visit Diagnosis:  At moderate risk for fall - Plan: PT plan of care cert/re-cert  Abnormal gait - Plan: PT plan of care cert/re-cert  Weakness of left hip - Plan: PT plan of care cert/re-cert  Hip stiffness, left - Plan: PT plan of care cert/re-cert      Subjective Assessment - 05/18/14 1531    Pertinent History Patient seen today s/p lt hip fracture 01/08/14. Patient discharged from [facility] center 03/07/14. No precautions. hurts most sitting. constant soreness and pain. some pain with bending forward. Pain improved with walking. Patient notes difficulty getting up from the ground of of knees due to  weakness. dififculty pushing grocery cart. history of heat stroke an dhyper tension   Patient Stated Goals Walk further, want better balance, to be able to push a heavy grocery cart , stand from floor without hands.    Currently in Pain? No/denies          [facility] PT Assessment - 05/18/14 0001    Assessment   Medical Diagnosis Lt hip fracture,    Onset Date 01/08/14   Next MD Visit 06/29/14 [place] [facility]   Prior Therapy yes    Balance Screen   Has the patient fallen in the past 6 months Yes   How many times? 1  2x a [facility], and 1 more time at home   Has the patient had a decrease in activity level because of a fear of falling?  Yes   Is the patient reluctant to leave their home because of a fear of falling?  No   Prior Function   Level of Independence Independent with basic ADLs   Functional Tests   Functional tests Other;Other2;Sit to Stand   Sit to Stand   Comments 5x sit to stand 10.39 seconds.    Other:   Other/ Comments Lunge matrix 5x each WNL minor balance difficulty.    ROM / Strength   AROM / PROM / Strength Strength   Strength   Strength Assessment Site Hip  Right/Left Hip Left;Right   Right Hip ABduction 4/5   Left Hip ABduction 3/5   Flexibility   Soft Tissue Assessment /Muscle Length yes   ITB limited and tender to palpation    Ambulation/Gait   Gait Comments Patient ambulates with Lt trendelenberg gait and decreased gait speed.    Standardized Balance Assessment   Standardized Balance Assessment Berg Balance Test;Dynamic Gait Index   Berg Balance Test   Sit to Stand Able to stand without using hands and stabilize independently   Standing Unsupported Able to stand safely 2 minutes   Sitting with Back Unsupported but Feet Supported on Floor or Stool Able to sit safely and securely 2 minutes   Stand to Sit Sits safely with minimal use of hands   Transfers Able to transfer safely, minor use of hands   Standing Unsupported with Eyes Closed Able to  stand 10 seconds safely   Standing Ubsupported with Feet Together Able to place feet together independently and stand 1 minute safely   From Standing, Reach Forward with Outstretched Arm Can reach confidently >25 cm (10")   From Standing Position, Pick up Object from Floor Able to pick up shoe safely and easily   From Standing Position, Turn to Look Behind Over each Shoulder Looks behind from both sides and weight shifts well   Turn 360 Degrees Able to turn 360 degrees safely in 4 seconds or less   Standing Unsupported, Alternately Place Feet on Step/Stool Able to stand independently and safely and complete 8 steps in 20 seconds   Standing Unsupported, One Foot in Front Able to place foot tandem independently and hold 30 seconds   Standing on One Leg Able to lift leg independently and hold 5-10 seconds   Total Score 55   Dynamic Gait Index   Level Surface Normal   Change in Gait Speed Normal   Gait with Horizontal Head Turns Normal   Gait with Vertical Head Turns Normal   Gait and Pivot Turn Normal   Step Over Obstacle Normal   Step Around Obstacles Normal   Steps Normal   Total Score 24          [facility] Adult PT Treatment/Exercise - 05/18/14 0001    Exercises   Exercises Knee/Hip   Knee/Hip Exercises: Stretches   ITB Stretch 5 reps;10 seconds   Knee/Hip Exercises: Standing   Forward Lunges Limitations lunge matrix common 5x each supervision provided   Other Standing Knee Exercises standing hip ebduction 10x           PT Education - 05/18/14 1633    Education provided Yes   Education Details HEP guidlines: walking 3x a day 10 to 30 minutes, walking up and down stairs 3x or more a day, and standing hip abduction, and ITand stretch   Person(s) Educated Patient;Child(ren)   Methods Explanation;Demonstration;Handout   Comprehension Verbalized understanding;Returned demonstration          PT Short Term Goals - 05/18/14 1645    PT SHORT TERM GOAL #1   Title Patient will  demonstrate inbcreased hip abduction strength bilaterally to 4/5 MMT to decrease trendelenberg gait    Time 4   Period Weeks   Status New   PT SHORT TERM GOAL #2   Title Patient will dmeonstrate increased tensor fascia latae and glut mobility as indicated by a negative obers test indicating improved hip mobility for gait.    Time 4   Period Weeks   Status New   PT SHORT  TERM GOAL #3   Title Patient will be I with HEP.    Time 4   Period Weeks   Status New           PT Long Term Goals - 14-Jun-2014 1721    PT LONG TERM GOAL #1   Title Patient will demonstrate increased gait speed to >1.54m/s indicating community ambuloatory status.    Time 4   Period Weeks   Status New   PT LONG TERM GOAL #2   Title Patient will be able to ambulate up and down stairs with 10lb dumbbell or 2x 5lb dumbbells.    Time 4   Period Weeks   Status New   PT LONG TERM GOAL #3   Title Patient will state no fear of falling.    Time 4   Period Weeks   Status New           Plan - 06/14/2014 1635    Clinical Impression Statement Patient demonstrates Lt hip weakness and stiffness s/p Lt hip fracture and partial replacement resultign in abnormal gait, difficulty with ambulating long distances and dificulty with high level baalnce activities. Specifcally patient dmeonstrates Lt hip abduction weakness resulting in trendelenber gait . Patient iwll benefit from skilled physical therapy to return to regular  walking program, increase gait speed and be abel to perform high level balnce activities to decrease fear of falling.    Pt will benefit from skilled therapeutic intervention in order to improve on the following deficits Abnormal gait;Decreased strength;Pain;Difficulty walking;Decreased activity tolerance;Increased fascial restricitons;Decreased range of motion;Postural dysfunction;Impaired flexibility;Decreased endurance;Decreased balance   Rehab Potential Good   PT Frequency 2x / week   PT Duration 4 weeks    PT Treatment/Interventions Therapeutic exercise;Balance training;Gait training;Stair training;Manual techniques   PT Next Visit Plan Continue education on lunge matrix common, introduce sumo walk, split stance 3D hip excursions, and progress functionality    PT Home Exercise Plan standing hip abduction, ITband stretch, walking program, and stair perofrmance.           G-Codes - 06/14/2014 1829    Functional Assessment Tool Used based on strength and balance tests   Functional Limitation Mobility: Walking and moving around   Mobility: Walking and Moving Around Current Status [phone]) At least 20 percent but less than 40 percent impaired, limited or restricted   Mobility: Walking and Moving Around Goal Status [phone]) At least 1 percent but less than 20 percent impaired, limited or restricted       Problem List Patient Active Problem List   Diagnosis Date Noted  . Iron deficiency anemia 01/22/2014  . Anxiety state 01/16/2014  . Cough 01/16/2014  . Left displaced femoral neck fracture 01/08/2014  . Chronic constipation 02/03/2011  . Hypertension 02/03/2011   Devona Konig PT DPT [place] [address] [place], [name], [address] Phone: [phone]   Fax:  [phone]

## 2014-05-23 ENCOUNTER — Ambulatory Visit (HOSPITAL_COMMUNITY): Payer: Medicare Other

## 2014-05-23 ENCOUNTER — Ambulatory Visit (HOSPITAL_COMMUNITY): Payer: Medicare Other | Admitting: Speech Pathology

## 2014-05-25 ENCOUNTER — Ambulatory Visit (HOSPITAL_COMMUNITY): Payer: Medicare Other | Admitting: Speech Pathology

## 2014-05-25 ENCOUNTER — Ambulatory Visit (HOSPITAL_COMMUNITY): Payer: Medicare Other | Admitting: Physical Therapy

## 2014-05-30 ENCOUNTER — Ambulatory Visit (HOSPITAL_COMMUNITY): Payer: Medicare Other | Admitting: Speech Pathology

## 2014-05-30 DIAGNOSIS — R41841 Cognitive communication deficit: Secondary | ICD-10-CM

## 2014-05-30 NOTE — Therapy (Signed)
[place], [name], [address] Phone: [phone]   Fax:  [phone]  Speech Language Pathology Treatment  Patient Details  Name: Jackie Phelps MRN: [id number] Date of Birth: August 02, 1931 Referring Provider:  Margaretmary Eddy, MD  Encounter Date: 05/30/2014      End of Session - 05/30/14 1530    Visit Number 6   Number of Visits 12   Date for SLP Re-Evaluation 06/27/14   Authorization Type Medicare   Authorization Time Period 04/28/2014-06/27/2014   Authorization - Visit Number 6   Authorization - Number of Visits 12   SLP Start Time 1194   SLP Stop Time  1528   SLP Time Calculation (min) 58 min   Activity Tolerance Patient tolerated treatment well;Other (comment)      Past Medical History  Diagnosis Date  . Hypertension   . Hypercholesteremia   . Anxiety   . Constipation     Past Surgical History  Procedure Laterality Date  . Colonoscopy    . Colonoscopy  03/05/2011    Procedure: COLONOSCOPY;  Surgeon: Rogene Houston, MD;  [facility];  Service: Endoscopy;  Laterality: N/A;  1200  . Hip arthroplasty Left 01/08/2014    Procedure: ARTHROPLASTY BIPOLAR HIP;  Surgeon: Marianna Payment, MD;  Location: [facility];  Service: Orthopedics;  Laterality: Left;    There were no vitals filed for this visit.  Visit Diagnosis: Cognitive communication deficit                 SLP Short Term Goals - 05/30/14 1614    SLP SHORT TERM GOAL #1   Title Pt will increase divergent naming to 10 items per concrete category with min cues.    Baseline 6   Time 6   Period Weeks   Status On-going   SLP SHORT TERM GOAL #2   Title Pt ill comlete functional memory activities with 90% accuracy with given min cues and use of strategies.   Baseline 60%.   Time 6   Period Weeks   Status On-going   SLP SHORT TERM GOAL #3   Title Pt will complete mod level thought organization/planning tasks with 90% acc  when given mild-mod cues.   Baseline 40%   Time 6   Period Weeks   Status On-going   SLP SHORT TERM GOAL #4   Title Pt will show orientation to specific time/events/palces by answering questions regarding personal details with 90% acc when given min cues.   Baseline 70%.   Time 6   Period Weeks   Status On-going          SLP Long Term Goals - 05/30/14 1614    SLP LONG TERM GOAL #1   Title Increase executive skills to [facility] with use of strategies as needed.   Baseline mod impairment   Time 6   Period Weeks   Status On-going   SLP LONG TERM GOAL #2   Title Express moderately complex thoughts/feeling in familar environment with use of compensatory strategies.   Baseline mild impairment   Time 6   Period Weeks   Status On-going          Plan - 05/30/14 1530    Clinical Impression Statement Jackie Phelps missed therapy last week because she had to travel to [place] to sell her home. She was in good spirits, but exhibited typical covering behaviors for memory lapses. She denies need for memory strategies, but will  allow SLP to review them. Her daughter has been recording items in her calendar and notebook. Pt was encouraged to record 3 items per day in her "journal" and continue to allow her daughter to record in the calendar. Will likely D/C from SLP services soon as this system in place seems to be working and pt is not "buying into" cognitive therapy. Will discuss with her daughter.    Speech Therapy Frequency 2x / week   Duration 2 weeks   Treatment/Interventions Compensatory techniques;Cueing hierarchy;Cognitive reorganization;Internal/external aids;Functional tasks;SLP instruction and feedback;Compensatory strategies;Patient/family education   Potential to Achieve Goals Fair   Potential Considerations Cooperation/participation level;Previous level of function   SLP Home Exercise Plan HEP will be implemented to faciliate carryover of skills.   Consulted and Agree with Plan of  Care Patient;Family member/caregiver   Family Member Consulted Daughter        Problem List Patient Active Problem List   Diagnosis Date Noted  . Iron deficiency anemia 01/22/2014  . Anxiety state 01/16/2014  . Cough 01/16/2014  . Left displaced femoral neck fracture 01/08/2014  . Chronic constipation 02/03/2011  . Hypertension 02/03/2011   Thank you,  Genene Churn, [place]  [facility] 05/30/2014, 4:15 PM  [place] [address] [place], [name], [address] Phone: [phone]   Fax:  [phone]

## 2014-06-01 ENCOUNTER — Ambulatory Visit (HOSPITAL_COMMUNITY): Payer: Medicare Other | Admitting: Physical Therapy

## 2014-06-01 ENCOUNTER — Ambulatory Visit (HOSPITAL_COMMUNITY): Payer: Medicare Other | Admitting: Speech Pathology

## 2014-06-01 DIAGNOSIS — R29898 Other symptoms and signs involving the musculoskeletal system: Secondary | ICD-10-CM

## 2014-06-01 DIAGNOSIS — Z9181 History of falling: Secondary | ICD-10-CM

## 2014-06-01 DIAGNOSIS — R41841 Cognitive communication deficit: Secondary | ICD-10-CM | POA: Diagnosis not present

## 2014-06-01 DIAGNOSIS — M25652 Stiffness of left hip, not elsewhere classified: Secondary | ICD-10-CM

## 2014-06-01 DIAGNOSIS — R269 Unspecified abnormalities of gait and mobility: Secondary | ICD-10-CM

## 2014-06-01 NOTE — Therapy (Signed)
[place], [name], [address] Phone: [phone]   Fax:  [phone]  Speech Language Pathology Treatment  Patient Details  Name: Jackie Phelps MRN: [id number] Date of Birth: Jan 25, 1931 Referring Provider:  Margaretmary Eddy, MD  Encounter Date: 06/01/2014      End of Session - 06/01/14 1633    Visit Number 7   Number of Visits 12   Date for SLP Re-Evaluation 06/27/14   Authorization Type Medicare   Authorization Time Period 04/28/2014-06/27/2014   Authorization - Visit Number 7   Authorization - Number of Visits 12   SLP Start Time 6629   SLP Stop Time  4765   SLP Time Calculation (min) 49 min   Activity Tolerance Patient tolerated treatment well;Other (comment)      Past Medical History  Diagnosis Date  . Hypertension   . Hypercholesteremia   . Anxiety   . Constipation     Past Surgical History  Procedure Laterality Date  . Colonoscopy    . Colonoscopy  03/05/2011    Procedure: COLONOSCOPY;  Surgeon: Rogene Houston, MD;  [facility];  Service: Endoscopy;  Laterality: N/A;  1200  . Hip arthroplasty Left 01/08/2014    Procedure: ARTHROPLASTY BIPOLAR HIP;  Surgeon: Marianna Payment, MD;  Location: [facility];  Service: Orthopedics;  Laterality: Left;    There were no vitals filed for this visit.  Visit Diagnosis: Cognitive communication deficit      Subjective Assessment - 06/01/14 1632    Subjective I am doing better   Currently in Pain? No/denies               ADULT SLP TREATMENT - 06/01/14 1632    General Information   Behavior/Cognition Alert;Cooperative;Pleasant mood   Patient Positioning Upright in chair   Treatment Provided   Treatment provided Cognitive-Linquistic   Pain Assessment   Pain Assessment No/denies pain   Cognitive-Linquistic Treatment   Treatment focused on Patient/family/caregiver education;Cognition   Skilled Treatment Memory skills and strategies,  attention   Assessment / Recommendations / Plan   Plan Discharge SLP treatment due to (comment)          SLP Education - 06/01/14 1633    Education provided Yes   Education Details discharge recommendations   Person(s) Educated Patient;Child(ren);Spouse   Methods Explanation;Demonstration   Comprehension Verbalized understanding          SLP Short Term Goals - 06/01/14 1634    SLP SHORT TERM GOAL #1   Title Pt will increase divergent naming to 10 items per concrete category with min cues.    Baseline 6   Time 6   Period Weeks   Status Partially Met   SLP SHORT TERM GOAL #2   Title Pt ill comlete functional memory activities with 90% accuracy with given min cues and use of strategies.   Baseline 60%.   Time 6   Period Weeks   Status Partially Met   SLP SHORT TERM GOAL #3   Title Pt will complete mod level thought organization/planning tasks with 90% acc when given mild-mod cues.   Baseline 40%   Time 6   Period Weeks   Status Partially Met   SLP SHORT TERM GOAL #4   Title Pt will show orientation to specific time/events/palces by answering questions regarding personal details with 90% acc when given min cues.   Baseline 70%.   Time 6   Period Weeks   Status  Partially Met          SLP Long Term Goals - Jun 29, 2014 1635    SLP LONG TERM GOAL #1   Title Increase executive skills to [facility] with use of strategies as needed.   Baseline mod impairment   Time 6   Period Weeks   Status Partially Met   SLP LONG TERM GOAL #2   Title Express moderately complex thoughts/feeling in familar environment with use of compensatory strategies.   Baseline mild impairment   Time 6   Period Weeks   Status Achieved          Plan - June 29, 2014 1634    Clinical Impression Statement Jackie Phelps completed assigned homework of recording at least 3 daily events/activities in her memory book/journal. Progress and goals of treatment were discussed with pt, husband, and her daughter. All  agree that pt seems to be improving. Her daughter notes that she no longer sits "looking confused", but is more active in the home and assisting with chores around the house. SLP stressed the importance of continuing with calendar and memory book so that Jackie Phelps can refer to it help "fill in any gaps". Although Jackie Phelps frequently repeats the same stories frequently without awareness of the same, she is able to remember to look her calendar for information. It is recommended that Jackie Phelps (pt's daughter) continue to fill out the calendar, but have Jackie Phelps fill out her daily journal in her memory book. Pt and family share agreement. No further skilled SLP services are indicated at this time now that a memory system is in place. Her daughter plans to remain involved and acknowledges the need for her supervision/assist with complex tasks (medication management, handling finances, and driving).  It has been a pleasure working with Jackie Phelps and her family. She will need to be monitored for signs of increasing memory problems, especially when she is in an unfamiliar environment.   Speech Therapy Frequency --   Duration --   Treatment/Interventions --   Potential to Achieve Goals Fair   Potential Considerations Cooperation/participation level;Previous level of function   SLP Home Exercise Plan HEP will be implemented to faciliate carryover of skills.   Consulted and Agree with Plan of Care Patient;Family member/caregiver   Family Member Consulted Daughter          G-Codes - 29-Jun-2014 1635    Functional Assessment Tool Used clinical judgement   Functional Limitations Memory   Memory Goal Status [id number]) At least 1 percent but less than 20 percent impaired, limited or restricted   Memory Discharge Status (G9170) At least 20 percent but less than 40 percent impaired, limited or restricted      Problem List Patient Active Problem List   Diagnosis Date Noted  . Iron deficiency anemia 01/22/2014   . Anxiety state 01/16/2014  . Cough 01/16/2014  . Left displaced femoral neck fracture 01/08/2014  . Chronic constipation 02/03/2011  . Hypertension 02/03/2011   SPEECH THERAPY DISCHARGE SUMMARY  Visits from Start of Care: 7  Current functional level related to goals / functional outcomes: Pt has partially met all goals. All goals were not achieved due to severity of memory impairment (reduced awareness). Pt is quick to state that she has "ADHD and could never remember things", however memory deficits appear more pronounced than what can be attributed to ADHD.   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 In a 10-item cued recall task, pt's performance actually declined with  repetition rather than strengthened which can been seen in individuals with dementia.   Education / Equipment: Memory book/daily journal and calendar Plan: Patient agrees to discharge.  Patient goals were partially met. Patient is being discharged due to being pleased with the current functional level.  ?????      Thank you,  Genene Churn, [place]  [facility] 06/01/2014, 4:36 PM  [place] [address] [place], [name], [address] Phone: [phone]   Fax:  [phone]

## 2014-06-01 NOTE — Therapy (Signed)
[place], [name], [address] Phone: [phone]   Fax:  [phone]  Physical Therapy Treatment  Patient Details  Name: Jackie Phelps MRN: [id number] Date of Birth: 01-11-1931 Referring Provider:  Margaretmary Eddy, MD  Encounter Date: 06/01/2014      PT End of Session - 06/01/14 1447    Visit Number 2   Number of Visits 8   Date for PT Re-Evaluation 06/17/14   Authorization Type Medicare   Authorization - Visit Number 2   Authorization - Number of Visits 10   PT Start Time 2992   PT Stop Time 4268   PT Time Calculation (min) 50 min   Activity Tolerance Patient tolerated treatment well;Other (comment)   Behavior During Therapy WFL for tasks assessed/performed      Past Medical History  Diagnosis Date  . Hypertension   . Hypercholesteremia   . Anxiety   . Constipation     Past Surgical History  Procedure Laterality Date  . Colonoscopy    . Colonoscopy  03/05/2011    Procedure: COLONOSCOPY;  Surgeon: Rogene Houston, MD;  [facility];  Service: Endoscopy;  Laterality: N/A;  1200  . Hip arthroplasty Left 01/08/2014    Procedure: ARTHROPLASTY BIPOLAR HIP;  Surgeon: Marianna Payment, MD;  Location: [facility];  Service: Orthopedics;  Laterality: Left;    There were no vitals filed for this visit.  Visit Diagnosis:  Cognitive communication deficit  At moderate risk for fall  Abnormal gait  Weakness of left hip  Hip stiffness, left      Subjective Assessment - 06/01/14 1444    Subjective Pt comes today with her notebook containing all her instructions from therapy.  Pt states she is "running" up the steps now and completing all her gardening.  Main complaint is soreness into her hip flexor/groin region but without pain.   Currently in Pain? No/denies               [facility] Adult PT Treatment/Exercise - 06/01/14 1445    Knee/Hip Exercises: Stretches   Active Hamstring Stretch 3  reps;20 seconds   Active Hamstring Stretch Limitations 12" box bilaterally   Hip Flexor Stretch 3 reps;20 seconds   Hip Flexor Stretch Limitations 12" box Lt only   ITB Stretch 3 reps;10 seconds   Soleus Stretch Limitations groin stretch 3X20" Lt only onto 12" box   Knee/Hip Exercises: Standing   Forward Lunges Limitations lunge matrix common 5x each supervision provided   Hip ADduction Both;10 reps   Hip ADduction Limitations ABDUCTION only   Other Standing Knee Exercises Marching with high holds 3" each 10 reps   Other Standing Knee Exercises split stance 3D hip excursion 10 reps each                PT Education - 06/01/14 1458    Education provided Yes   Education Details given written copy of initial evaluation with explanation of goals   Person(s) Educated Patient   Methods Explanation;Handout   Comprehension Verbalized understanding          PT Short Term Goals - 05/18/14 1645    PT SHORT TERM GOAL #1   Title Patient will demonstrate inbcreased hip abduction strength bilaterally to 4/5 MMT to decrease trendelenberg gait    Time 4   Period Weeks   Status New   PT SHORT TERM GOAL #2   Title Patient will dmeonstrate increased tensor fascia  latae and glut mobility as indicated by a negative obers test indicating improved hip mobility for gait.    Time 4   Period Weeks   Status New   PT SHORT TERM GOAL #3   Title Patient will be I with HEP.    Time 4   Period Weeks   Status New           PT Long Term Goals - 05/18/14 1721    PT LONG TERM GOAL #1   Title Patient will demonstrate increased gait speed to >1.73m/s indicating community ambuloatory status.    Time 4   Period Weeks   Status New   PT LONG TERM GOAL #2   Title Patient will be able to ambulate up and down stairs with 10lb dumbbell or 2x 5lb dumbbells.    Time 4   Period Weeks   Status New   PT LONG TERM GOAL #3   Title Patient will state no fear of falling.    Time 4   Period Weeks    Status New               Plan - 06/01/14 1448    Clinical Impression Statement Pt with difficulty recalling therex and completing in correct form.  Requires max cues to complete with therapist facilitation.  Began split stance #D hip excursions, hip flexion, abduction and stretches for LE's to to increase strength and decrease soreness.  PT will require repeated instruction and repetition with therex.  PT given copy of initial evaluation.     PT Next Visit Plan Next visit introduce sumo walk and progress higher level balance actvities.   PT Home Exercise Plan standing hip abduction, ITband stretch, walking program, and stair performance.         Problem List Patient Active Problem List   Diagnosis Date Noted  . Iron deficiency anemia 01/22/2014  . Anxiety state 01/16/2014  . Cough 01/16/2014  . Left displaced femoral neck fracture 01/08/2014  . Chronic constipation 02/03/2011  . Hypertension 02/03/2011    Teena Irani, PTA/CLT [phone] 06/01/2014, 2:59 PM  [place] [address] [place], [name], [address] Phone: [phone]   Fax:  [phone]

## 2014-06-06 ENCOUNTER — Ambulatory Visit (HOSPITAL_COMMUNITY): Payer: Medicare Other | Admitting: Speech Pathology

## 2014-06-08 ENCOUNTER — Ambulatory Visit (HOSPITAL_COMMUNITY): Payer: Medicare Other | Admitting: Speech Pathology

## 2014-06-08 ENCOUNTER — Ambulatory Visit (HOSPITAL_COMMUNITY): Payer: Medicare Other | Attending: Internal Medicine

## 2014-06-08 DIAGNOSIS — R269 Unspecified abnormalities of gait and mobility: Secondary | ICD-10-CM

## 2014-06-08 DIAGNOSIS — Z9181 History of falling: Secondary | ICD-10-CM

## 2014-06-08 DIAGNOSIS — R41841 Cognitive communication deficit: Secondary | ICD-10-CM | POA: Insufficient documentation

## 2014-06-08 DIAGNOSIS — R29898 Other symptoms and signs involving the musculoskeletal system: Secondary | ICD-10-CM

## 2014-06-08 DIAGNOSIS — M25652 Stiffness of left hip, not elsewhere classified: Secondary | ICD-10-CM | POA: Diagnosis not present

## 2014-06-08 NOTE — Therapy (Signed)
[place], [name], [address] Phone: [phone]   Fax:  [phone]  Physical Therapy Treatment  Patient Details  Name: Jackie Phelps MRN: [id number] Date of Birth: 1931-11-24 Referring Provider:  Margaretmary Eddy, MD  Encounter Date: 06/08/2014      PT End of Session - 06/08/14 1356    Visit Number 3   Number of Visits 8   Date for PT Re-Evaluation 06/17/14   Authorization Type Medicare   Authorization - Visit Number 3   Authorization - Number of Visits 10   PT Start Time 9381   PT Stop Time 1434   PT Time Calculation (min) 42 min   Equipment Utilized During Treatment Gait belt   Activity Tolerance Patient tolerated treatment well   Behavior During Therapy [facility] for tasks assessed/performed      Past Medical History  Diagnosis Date  . Hypertension   . Hypercholesteremia   . Anxiety   . Constipation     Past Surgical History  Procedure Laterality Date  . Colonoscopy    . Colonoscopy  03/05/2011    Procedure: COLONOSCOPY;  Surgeon: Rogene Houston, MD;  [facility];  Service: Endoscopy;  Laterality: N/A;  1200  . Hip arthroplasty Left 01/08/2014    Procedure: ARTHROPLASTY BIPOLAR HIP;  Surgeon: Marianna Payment, MD;  Location: [facility];  Service: Orthopedics;  Laterality: Left;    There were no vitals filed for this visit.  Visit Diagnosis:  At moderate risk for fall  Abnormal gait  Weakness of left hip  Hip stiffness, left      Subjective Assessment - 06/08/14 1355    Subjective Pt stated she is feeling good today, complaint with HEP and has been walking more up and down hills.     Currently in Pain? No/denies             [facility] Adult PT Treatment/Exercise - 06/08/14 0001    Exercises   Exercises Knee/Hip   Knee/Hip Exercises: Stretches   Active Hamstring Stretch 3 reps;20 seconds   Active Hamstring Stretch Limitations 12" box bilaterally   Hip Flexor Stretch 3 reps;20  seconds   Hip Flexor Stretch Limitations 12" box Lt only   Gastroc Stretch 3 reps;30 seconds   Gastroc Stretch Limitations slant board   Knee/Hip Exercises: Standing   Forward Lunges Limitations lunge matrix common 5x each supervision provided   Hip ADduction Both;10 reps   Hip ADduction Limitations ABDUCTION only   SLS L28", Rt 32"   Other Standing Knee Exercises Marching with high holds 3" each 10 reps no HHA; tandem stance 2x 30", Tandem gait 1RT, Sumo walking 1RT   Other Standing Knee Exercises split stance 3D hip excursion 10 reps each              PT Short Term Goals - 06/08/14 1358    PT SHORT TERM GOAL #1   Title Patient will demonstrate inbcreased hip abduction strength bilaterally to 4/5 MMT to decrease trendelenberg gait    Status On-going   PT SHORT TERM GOAL #2   Title Patient will dmeonstrate increased tensor fascia latae and glut mobility as indicated by a negative obers test indicating improved hip mobility for gait.    Status On-going   PT SHORT TERM GOAL #3   Title Patient will be I with HEP.    Status On-going           PT Long Term Goals -  06/08/14 1359    PT LONG TERM GOAL #1   Title Patient will demonstrate increased gait speed to >1.36m/s indicating community ambuloatory status.    PT LONG TERM GOAL #2   Title Patient will be able to ambulate up and down stairs with 10lb dumbbell or 2x 5lb dumbbells.    PT LONG TERM GOAL #3   Title Patient will state no fear of falling.                Plan - 06/08/14 1502    Clinical Impression Statement Session focus on gluteal strengthening and balance activities.  Added sumo walking for glut med strengthening and began SLS and NBOS activities.with min assistance required for LOB episodes.  Pt required therapist facilitaiton for proper form and following commands for techniques through session.  No reports of pain through session.     PT Next Visit Plan Continue gluteal strengthening with squats, sumo  walking and lunges, begin balance activities on dynamic surface next session.          Problem List Patient Active Problem List   Diagnosis Date Noted  . Iron deficiency anemia 01/22/2014  . Anxiety state 01/16/2014  . Cough 01/16/2014  . Left displaced femoral neck fracture 01/08/2014  . Chronic constipation 02/03/2011  . Hypertension 02/03/2011   Aldona Lento, PTA  Aldona Lento 06/08/2014, [place] [address] [place], [name], [address] Phone: [phone]   Fax:  [phone]

## 2014-06-13 ENCOUNTER — Ambulatory Visit (HOSPITAL_COMMUNITY): Payer: Medicare Other | Admitting: Speech Pathology

## 2014-06-13 ENCOUNTER — Ambulatory Visit (HOSPITAL_COMMUNITY): Payer: Medicare Other

## 2014-06-13 DIAGNOSIS — M25652 Stiffness of left hip, not elsewhere classified: Secondary | ICD-10-CM

## 2014-06-13 DIAGNOSIS — R269 Unspecified abnormalities of gait and mobility: Secondary | ICD-10-CM

## 2014-06-13 DIAGNOSIS — Z9181 History of falling: Secondary | ICD-10-CM

## 2014-06-13 DIAGNOSIS — R29898 Other symptoms and signs involving the musculoskeletal system: Secondary | ICD-10-CM

## 2014-06-13 DIAGNOSIS — R41841 Cognitive communication deficit: Secondary | ICD-10-CM | POA: Diagnosis not present

## 2014-06-13 NOTE — Therapy (Signed)
[place], [name], [address] Phone: [phone]   Fax:  [phone]  Physical Therapy Treatment  Patient Details  Name: Jackie Phelps MRN: [id number] Date of Birth: 13-Feb-1931 Referring Provider:  Doree Albee, MD  Encounter Date: 06/13/2014      PT End of Session - 06/13/14 1526    Visit Number 4   Number of Visits 8   Date for PT Re-Evaluation 06/17/14   Authorization Type Medicare   Authorization - Visit Number 4   Authorization - Number of Visits 10   PT Start Time 2248   PT Stop Time 1558   PT Time Calculation (min) 40 min   Equipment Utilized During Treatment Gait belt   Activity Tolerance Patient tolerated treatment well   Behavior During Therapy [facility] for tasks assessed/performed      Past Medical History  Diagnosis Date  . Hypertension   . Hypercholesteremia   . Anxiety   . Constipation     Past Surgical History  Procedure Laterality Date  . Colonoscopy    . Colonoscopy  03/05/2011    Procedure: COLONOSCOPY;  Surgeon: Rogene Houston, MD;  [facility];  Service: Endoscopy;  Laterality: N/A;  1200  . Hip arthroplasty Left 01/08/2014    Procedure: ARTHROPLASTY BIPOLAR HIP;  Surgeon: Marianna Payment, MD;  Location: [facility];  Service: Orthopedics;  Laterality: Left;    There were no vitals filed for this visit.  Visit Diagnosis:  At moderate risk for fall  Abnormal gait  Weakness of left hip  Hip stiffness, left      Subjective Assessment - 06/13/14 1522    Subjective Pain free today, stated she has been busy walking more and doing stairs at home.     Currently in Pain? No/denies            [facility] Adult PT Treatment/Exercise - 06/13/14 0001    Exercises   Exercises Knee/Hip   Knee/Hip Exercises: Stretches   Hip Flexor Stretch 3 reps;20 seconds   Hip Flexor Stretch Limitations 14in step   ITB Stretch 3 reps;10 seconds   Knee/Hip Exercises: Standing   Forward Lunges Limitations lunge matrix common 5x each supervision provided   Lateral Step Up Left;15 reps;Hand Hold: 1;Step Height: 4"   Forward Step Up Left;15 reps;Hand Hold: 1;Step Height: 6"   SLS Lt 11", Rt 23"   Other Standing Knee Exercises Tandem, retro and sidestepping on balance beam   Other Standing Knee Exercises split stance 3D hip excursion 10 reps each; Sumo walking and side stepping with RTB 2RT             PT Short Term Goals - 06/13/14 1527    PT SHORT TERM GOAL #1   Title Patient will demonstrate inbcreased hip abduction strength bilaterally to 4/5 MMT to decrease trendelenberg gait    Status On-going   PT SHORT TERM GOAL #2   Title Patient will dmeonstrate increased tensor fascia latae and glut mobility as indicated by a negative obers test indicating improved hip mobility for gait.    Status On-going   PT SHORT TERM GOAL #3   Title Patient will be I with HEP.    Status On-going           PT Long Term Goals - 06/13/14 1527    PT LONG TERM GOAL #1   Title Patient will demonstrate increased gait speed to >1.20m/s indicating community ambuloatory status.  PT LONG TERM GOAL #2   Title Patient will be able to ambulate up and down stairs with 10lb dumbbell or 2x 5lb dumbbells.    PT LONG TERM GOAL #3   Title Patient will state no fear of falling.                Plan - 06/13/14 1527    Clinical Impression Statement Continued session focus on gluteal strengthening and balance activities to reduce fear of falling.  Progressed to balance activities on dynamic surfaced to improve balance with min assistance required for LOB episodes.  Continued with sumo walking, sidestepping with red theraband and on balance beam for gluteal strengthening.  Began stair training with cueing to reduce compensation due to weakness.  No reports of pain through session.     PT Next Visit Plan Continue gluteal strengthening with squats, sumo walking, stairs and lunges, continue  balance activities on dynamic surface next session.          Problem List Patient Active Problem List   Diagnosis Date Noted  . Iron deficiency anemia 01/22/2014  . Anxiety state 01/16/2014  . Cough 01/16/2014  . Left displaced femoral neck fracture 01/08/2014  . Chronic constipation 02/03/2011  . Hypertension 02/03/2011   Aldona Lento, PTA  Aldona Lento 06/13/2014, 4:01 PM  [facility] [address] [place], [name], [address] Phone: [phone]   Fax:  [phone]

## 2014-06-15 ENCOUNTER — Ambulatory Visit (HOSPITAL_COMMUNITY): Payer: Medicare Other | Admitting: Physical Therapy

## 2014-06-15 ENCOUNTER — Ambulatory Visit (HOSPITAL_COMMUNITY): Payer: Medicare Other | Admitting: Speech Pathology

## 2014-06-21 ENCOUNTER — Ambulatory Visit (HOSPITAL_COMMUNITY): Payer: Medicare Other | Admitting: Physical Therapy

## 2014-06-21 DIAGNOSIS — R29898 Other symptoms and signs involving the musculoskeletal system: Secondary | ICD-10-CM

## 2014-06-21 DIAGNOSIS — R41841 Cognitive communication deficit: Secondary | ICD-10-CM | POA: Diagnosis not present

## 2014-06-21 DIAGNOSIS — M25652 Stiffness of left hip, not elsewhere classified: Secondary | ICD-10-CM

## 2014-06-21 DIAGNOSIS — R269 Unspecified abnormalities of gait and mobility: Secondary | ICD-10-CM

## 2014-06-21 DIAGNOSIS — Z9181 History of falling: Secondary | ICD-10-CM

## 2014-06-21 NOTE — Therapy (Signed)
[place], [name], [address] Phone: [phone]   Fax:  [phone]  Physical Therapy Treatment (Re-Assessment)  Patient Details  Name: Jackie Phelps MRN: [id number] Date of Birth: October 30, 1931 Referring Provider:  Margaretmary Eddy, MD  Encounter Date: 06/21/2014      PT End of Session - 06/21/14 1235    Visit Number 5   Number of Visits 8   Date for PT Re-Evaluation 07/19/14   Authorization Type Medicare   Authorization Time Period G-code done 5th session    Authorization - Visit Number 5   Authorization - Number of Visits 10   PT Start Time 5974   PT Stop Time 1230   PT Time Calculation (min) 38 min   Activity Tolerance Patient tolerated treatment well   Behavior During Therapy [facility] for tasks assessed/performed      Past Medical History  Diagnosis Date  . Hypertension   . Hypercholesteremia   . Anxiety   . Constipation     Past Surgical History  Procedure Laterality Date  . Colonoscopy    . Colonoscopy  03/05/2011    Procedure: COLONOSCOPY;  Surgeon: Rogene Houston, MD;  [facility];  Service: Endoscopy;  Laterality: N/A;  1200  . Hip arthroplasty Left 01/08/2014    Procedure: ARTHROPLASTY BIPOLAR HIP;  Surgeon: Marianna Payment, MD;  Location: [facility];  Service: Orthopedics;  Laterality: Left;    There were no vitals filed for this visit.  Visit Diagnosis:  At moderate risk for fall  Abnormal gait  Weakness of left hip  Hip stiffness, left      Subjective Assessment - 06/21/14 1158    Subjective Patient very pleasant today, statse she feels she is doing pretty well at home, can't really think of any issues she is having at home.    Pertinent History Patient seen today s/p lt hip fracture 01/08/14. Patient discharged from [facility] center 03/07/14. No precautions. hurts most sitting. constant soreness and pain. some pain with bending forward. Pain improved with walking. Patient notes  difficulty getting up from the ground of of knees due to weakness. dififculty pushing grocery cart. history of heat stroke an dhyper tension   Patient Stated Goals Walk further, want better balance, to be able to push a heavy grocery cart , stand from floor without hands.    Currently in Pain? No/denies  not pure pain, just discomfort             [facility] PT Assessment - 06/21/14 0001    Sit to Stand   Comments 5x sit to stand 9.25 seconds    Strength   Right Hip ABduction 4/5   Left Hip ABduction 4+/5   Berg Balance Test   Sit to Stand Able to stand without using hands and stabilize independently   Standing Unsupported Able to stand safely 2 minutes   Sitting with Back Unsupported but Feet Supported on Floor or Stool Able to sit safely and securely 2 minutes   Stand to Sit Sits safely with minimal use of hands   Transfers Able to transfer safely, minor use of hands   Standing Unsupported with Eyes Closed Able to stand 10 seconds safely   Standing Ubsupported with Feet Together Able to place feet together independently and stand 1 minute safely   From Standing, Reach Forward with Outstretched Arm Can reach confidently >25 cm (10")   From Standing Position, Pick up Object from Floor Able to  pick up shoe safely and easily   From Standing Position, Turn to Look Behind Over each Shoulder Looks behind from both sides and weight shifts well   Turn 360 Degrees Able to turn 360 degrees safely in 4 seconds or less   Standing Unsupported, Alternately Place Feet on Step/Stool Able to stand independently and safely and complete 8 steps in 20 seconds   Standing Unsupported, One Foot in [place] to place foot tandem independently and hold 30 seconds   Standing on One Leg Able to lift leg independently and hold 5-10 seconds   Total Score 55   Dynamic Gait Index   Level Surface Normal   Change in Gait Speed Normal   Gait with Horizontal Head Turns Normal   Gait with Vertical Head Turns Normal    Gait and Pivot Turn Normal   Step Over Obstacle Normal   Step Around Obstacles Normal   Steps Normal   Total Score 24                     [facility] Adult PT Treatment/Exercise - 06/21/14 0001    Knee/Hip Exercises: Stretches   Active Hamstring Stretch 30 seconds;3 reps   Active Hamstring Stretch Limitations 12 inch box   Gastroc Stretch 3 reps;30 seconds   Gastroc Stretch Limitations slant board                PT Education - 06/21/14 1234    Education provided Yes   Education Details progress wtih skilled PT services, plan of care movnig forward    Person(s) Educated Patient   Methods Explanation   Comprehension Verbalized understanding          PT Short Term Goals - 06/21/14 1218    PT SHORT TERM GOAL #1   Title Patient will demonstrate inbcreased hip abduction strength bilaterally to 4/5 MMT to decrease trendelenberg gait    Time 4   Period Weeks   Status Achieved   PT SHORT TERM GOAL #2   Title Patient will dmeonstrate increased tensor fascia latae and glut mobility as indicated by a negative obers test indicating improved hip mobility for gait.    Time 4   Period Weeks   Status On-going   PT SHORT TERM GOAL #3   Title Patient will be I with HEP.    Time 4   Period Weeks   Status On-going           PT Long Term Goals - 06/21/14 1221    PT LONG TERM GOAL #1   Title Patient will demonstrate increased gait speed to >1.98m/s indicating community ambuloatory status.    Time 4   Period Weeks   Status Achieved   PT LONG TERM GOAL #2   Title Patient will be able to ambulate up and down stairs with 10lb dumbbell or 2x 5lb dumbbells.    Time 4   Period Weeks   Status Achieved   PT LONG TERM GOAL #3   Title Patient will state no fear of falling.    Time 4   Period Weeks   Status Achieved               Plan - 06/21/14 1236    Clinical Impression Statement Re-assessment performed today. Patient shows great improvement with skilled PT  services with last remaining impairments being hip ABD strength, balance deficits, ITB band tightness, and safety awareness. Patient will benefit form a continuation of skilled PT services for  approximately 3 more visits to address these limitations and assist her in reaching an optimal level of function with low fall risk.    Pt will benefit from skilled therapeutic intervention in order to improve on the following deficits Abnormal gait;Decreased strength;Pain;Difficulty walking;Decreased activity tolerance;Increased fascial restricitons;Decreased range of motion;Postural dysfunction;Impaired flexibility;Decreased endurance;Decreased balance   Rehab Potential Good   PT Frequency Other (comment)  3 more visits    PT Duration Other (comment)  3 more visits    PT Treatment/Interventions Therapeutic exercise;Balance training;Gait training;Stair training;Manual techniques   PT Next Visit Plan Continue gluteal strengthening with squats, sumo walking, stairs and lunges, continue balance activities on dynamic surface next session.     PT Home Exercise Plan standing hip abduction, ITband stretch, walking program, and stair performance.    Consulted and Agree with Plan of Care Patient          G-Codes - 2014-07-12 1239    Functional Assessment Tool Used based on strength and balance tests   Functional Limitation Mobility: Walking and moving around   Mobility: Walking and Moving Around Current Status [phone]) At least 1 percent but less than 20 percent impaired, limited or restricted   Mobility: Walking and Moving Around Goal Status [phone]) 0 percent impaired, limited or restricted      Problem List Patient Active Problem List   Diagnosis Date Noted  . Iron deficiency anemia 01/22/2014  . Anxiety state 01/16/2014  . Cough 01/16/2014  . Left displaced femoral neck fracture 01/08/2014  . Chronic constipation 02/03/2011  . Hypertension 02/03/2011     Physical Therapy Progress Note  Dates of  Reporting Period: 05/18/14 to 2014/07/12  Objective Reports of Subjective Statement: Patient reports that she is feeling great at home, no reports of fear of falling and states that she cannot think of any big issues she is having at home.   Objective Measurements: See above   Goal Update: See above   Plan: Continue for 3 more sessions with focus on balance, ITB flexibiltiy, safety awarenses, hip ABD strength   Reason Skilled Services are Required: reduced balance, reduced ITB flexiblity, reduced safety awareness, reduced hip ABD strength    Deniece Ree PT, DPT [place] [address] [place], [name], [address] Phone: [phone]   Fax:  [phone]

## 2014-06-23 ENCOUNTER — Ambulatory Visit (HOSPITAL_COMMUNITY): Payer: Medicare Other

## 2014-06-23 DIAGNOSIS — R41841 Cognitive communication deficit: Secondary | ICD-10-CM

## 2014-06-23 DIAGNOSIS — M25652 Stiffness of left hip, not elsewhere classified: Secondary | ICD-10-CM

## 2014-06-23 DIAGNOSIS — R29898 Other symptoms and signs involving the musculoskeletal system: Secondary | ICD-10-CM

## 2014-06-23 DIAGNOSIS — Z9181 History of falling: Secondary | ICD-10-CM

## 2014-06-23 DIAGNOSIS — R269 Unspecified abnormalities of gait and mobility: Secondary | ICD-10-CM

## 2014-06-23 NOTE — Therapy (Signed)
[place], [name], [address] Phone: [phone]   Fax:  [phone]  Physical Therapy Treatment  Patient Details  Name: Jackie Phelps MRN: [id number] Date of Birth: 17-Oct-1931 Referring Provider:  Margaretmary Eddy, MD  Encounter Date: 06/23/2014      PT End of Session - 06/23/14 1627    Visit Number 6   Number of Visits 8   Date for PT Re-Evaluation 07/19/14   Authorization Type Medicare   Authorization Time Period G-code done 5th session    Authorization - Visit Number 6   Authorization - Number of Visits 10   PT Start Time 4315   PT Stop Time 4008   PT Time Calculation (min) 39 min   Activity Tolerance Patient tolerated treatment well   Behavior During Therapy [facility] for tasks assessed/performed      Past Medical History  Diagnosis Date  . Hypertension   . Hypercholesteremia   . Anxiety   . Constipation     Past Surgical History  Procedure Laterality Date  . Colonoscopy    . Colonoscopy  03/05/2011    Procedure: COLONOSCOPY;  Surgeon: Rogene Houston, MD;  [facility];  Service: Endoscopy;  Laterality: N/A;  1200  . Hip arthroplasty Left 01/08/2014    Procedure: ARTHROPLASTY BIPOLAR HIP;  Surgeon: Marianna Payment, MD;  Location: [place];  Service: Orthopedics;  Laterality: Left;    There were no vitals filed for this visit.  Visit Diagnosis:  At moderate risk for fall  Abnormal gait  Weakness of left hip  Hip stiffness, left  Cognitive communication deficit      Subjective Assessment - 06/23/14 1442    Subjective Patient very pleasant today, statse she feels she is doing pretty well at home. Pt reports mild groin pain intermitently throughout day, but otherwise all is well.    Pertinent History Patient seen today s/p lt hip fracture 01/08/14. Patient discharged from [facility] center 03/07/14. No precautions. hurts most sitting. constant soreness and pain. some pain with bending  forward. Pain improved with walking. Patient notes difficulty getting up from the ground of of knees due to weakness. dififculty pushing grocery cart. history of heat stroke an dhyper tension                         [facility] Adult PT Treatment/Exercise - 06/23/14 1445    Knee/Hip Exercises: Stretches   Active Hamstring Stretch 30 seconds;3 reps   Active Hamstring Stretch Limitations 12 inch box   Hip Flexor Stretch 3 reps;20 seconds   Hip Flexor Stretch Limitations 14in step   Knee/Hip Exercises: Plyometrics   Other Plyometric Exercises functional squats   2x10   Knee/Hip Exercises: Standing   Side Lunges 2 sets;15 reps  lunge walk, Red TB around ankles, butt against wall semi sqt   Lateral Step Up Left;15 reps;Hand Hold: 1;Step Height: 6"   Forward Step Up Left;15 reps;Hand Hold: 1;Step Height: 6"   SLS 5x30sec bilat   Other Standing Knee Exercises tandem stance 2x30sec bilat  MinA for 3 LLOB     * SLS activity performed on Airex pad with MinA to recover 5+ LOB with activity.             PT Education - 06/23/14 1627    Education provided Yes   Education Details Importance in balance during everyday activity.    Person(s) Educated Patient   Methods Explanation  Comprehension Verbalized understanding          PT Short Term Goals - 06/21/14 1218    PT SHORT TERM GOAL #1   Title Patient will demonstrate inbcreased hip abduction strength bilaterally to 4/5 MMT to decrease trendelenberg gait    Time 4   Period Weeks   Status Achieved   PT SHORT TERM GOAL #2   Title Patient will dmeonstrate increased tensor fascia latae and glut mobility as indicated by a negative obers test indicating improved hip mobility for gait.    Time 4   Period Weeks   Status On-going   PT SHORT TERM GOAL #3   Title Patient will be I with HEP.    Time 4   Period Weeks   Status On-going           PT Long Term Goals - 06/21/14 1221    PT LONG TERM GOAL #1   Title  Patient will demonstrate increased gait speed to >1.33m/s indicating community ambuloatory status.    Time 4   Period Weeks   Status Achieved   PT LONG TERM GOAL #2   Title Patient will be able to ambulate up and down stairs with 10lb dumbbell or 2x 5lb dumbbells.    Time 4   Period Weeks   Status Achieved   PT LONG TERM GOAL #3   Title Patient will state no fear of falling.    Time 4   Period Weeks   Status Achieved               Plan - 06/23/14 1628    Clinical Impression Statement Pt making progress toward goals as evidenced y imrproved strength and mobility. Pt continues to demonstrate poor balance. Pt will benefit from continued skilled PT intervention to improve balance and strength to order to reduce risk of falls.    Pt will benefit from skilled therapeutic intervention in order to improve on the following deficits Abnormal gait;Decreased strength;Pain;Difficulty walking;Decreased activity tolerance;Increased fascial restricitons;Decreased range of motion;Postural dysfunction;Impaired flexibility;Decreased endurance;Decreased balance   Rehab Potential Good   PT Frequency Other (comment)   PT Duration Other (comment)   PT Treatment/Interventions Therapeutic exercise;Balance training;Gait training;Stair training;Manual techniques   PT Next Visit Plan Continue gluteal strengthening with squats, sumo walking, stairs and lunges, continue balance activities on dynamic surface next session.     PT Home Exercise Plan No updates.         Problem List Patient Active Problem List   Diagnosis Date Noted  . Iron deficiency anemia 01/22/2014  . Anxiety state 01/16/2014  . Cough 01/16/2014  . Left displaced femoral neck fracture 01/08/2014  . Chronic constipation 02/03/2011  . Hypertension 02/03/2011    Buccola,Allan C 06/23/2014, 4:31 PM  4:31 PM  Etta Grandchild, PT, DPT [place] License # [id number]       [facility] [address] [place], [name], [address] Phone: [phone]   Fax:  [phone]

## 2014-06-28 ENCOUNTER — Telehealth (HOSPITAL_COMMUNITY): Payer: Self-pay

## 2014-06-28 ENCOUNTER — Ambulatory Visit (HOSPITAL_COMMUNITY): Payer: Medicare Other

## 2014-06-28 NOTE — Telephone Encounter (Signed)
Daughter called to say Dad is having knee surgery soon and she is working and neither one can bring Halleigh to her apptment today she will be here Friday

## 2014-06-30 ENCOUNTER — Ambulatory Visit (HOSPITAL_COMMUNITY): Payer: Medicare Other

## 2014-06-30 ENCOUNTER — Ambulatory Visit (HOSPITAL_COMMUNITY): Payer: Medicare Other | Admitting: Physical Therapy

## 2014-06-30 DIAGNOSIS — R41841 Cognitive communication deficit: Secondary | ICD-10-CM

## 2014-06-30 DIAGNOSIS — M25652 Stiffness of left hip, not elsewhere classified: Secondary | ICD-10-CM

## 2014-06-30 DIAGNOSIS — Z9181 History of falling: Secondary | ICD-10-CM

## 2014-06-30 DIAGNOSIS — R29898 Other symptoms and signs involving the musculoskeletal system: Secondary | ICD-10-CM

## 2014-06-30 DIAGNOSIS — R269 Unspecified abnormalities of gait and mobility: Secondary | ICD-10-CM

## 2014-06-30 NOTE — Therapy (Signed)
Jackie Phel[place], [name], [address] Phone: [phone]   Fax:  [phone]  Physical Therapy Treatment  Patient Details  Name: Jackie Phelps MRN: [id number] Date of Birth: [birth year] Referring Provider:  Doree Albee, MD  Encounter Date: 06/30/2014    Past Medical History  Diagnosis Date  . Hypertension   . Hypercholesteremia   . Anxiety   . Constipation     Past Surgical History  Procedure Laterality Date  . Colonoscopy    . Colonoscopy  03/05/2011    Procedure: COLONOSCOPY;  Surgeon: Rogene Houston, MD;  [facility];  Service: Endoscopy;  Laterality: N/A;  1200  . Hip arthroplasty Left 01/08/2014    Procedure: ARTHROPLASTY BIPOLAR HIP;  Surgeon: Marianna Payment, MD;  Location: [place];  Service: Orthopedics;  Laterality: Left;    There were no vitals filed for this visit.  Visit Diagnosis:  At moderate risk for fall  Abnormal gait  Weakness of left hip  Hip stiffness, left  Cognitive communication deficit      Subjective Assessment - 06/30/14 1110    Subjective Pt states she is doing great!  Pt states at this time she is not havng any trouble.    Pertinent History Patient seen today s/p lt hip fracture 01/08/14. Patient discharged from [facility] center 03/07/14. No precautions. hurts most sitting. constant soreness and pain. some pain with bending forward. Pain improved with walking. Patient notes difficulty getting up from the ground of of knees due to weakness. dififculty pushing grocery cart. history of heat stroke an dhyper tension   Patient Stated Goals Walk further, want better balance, to be able to push a heavy grocery cart , stand from floor without hands.       Pt is at prior level of functioning.  She was encouraged to begin a walking program with therapist going over all the benefits as well as beginning a balance program for better health.        [facility] PT Assessment - 06/30/14  0001    Assessment   Medical Diagnosis Lt hip fracture,    Onset Date/Surgical Date 01/08/14   Next MD Visit 06/29/14 [facility] Citizen   Prior Therapy yes    Prior Function   Level of Independence Independent with basic ADLs   Functional Tests   Functional tests Single leg stance;Sit to Stand;Other;Other2   Single Leg Stance   Comments Lt 30 seconds; Rt 12 seconds    Sit to Stand   Comments 5x sit to stand  8 seconds was 10.39 seconds.    Other:   Other/ Comments Lunge matrix 5x each WNL minor balance difficulty.    Strength   Right Hip ABduction 4/5   Left Hip Flexion 5/5   Left Hip Extension 4/5  Rt is 4/5    Left Hip ABduction 5/5  was 3/5   Left Knee Extension 5/5   Flexibility   Soft Tissue Assessment /Muscle Length yes   ITB limited and tender to palpation    Ambulation/Gait   Gait Comments Patient ambulates with Lt trendelenberg gait and decreased gait speed.    Standardized Balance Assessment   Standardized Balance Assessment Berg Balance Test;Dynamic Gait Index   Berg Balance Test   Sit to Stand Able to stand without using hands and stabilize independently   Standing Unsupported Able to stand safely 2 minutes   Sitting with Back Unsupported but Feet Supported on Floor or  Stool Able to sit safely and securely 2 minutes   Stand to Sit Sits safely with minimal use of hands   Transfers Able to transfer safely, minor use of hands   Standing Unsupported with Eyes Closed Able to stand 10 seconds safely   Standing Ubsupported with Feet Together Able to place feet together independently and stand 1 minute safely   From Standing, Reach Forward with Outstretched Arm Can reach confidently >25 cm (10")   From Standing Position, Pick up Object from Floor Able to pick up shoe safely and easily   From Standing Position, Turn to Look Behind Over each Shoulder Looks behind from both sides and weight shifts well   Turn 360 Degrees Able to turn 360 degrees safely in 4 seconds or less    Standing Unsupported, Alternately Place Feet on Step/Stool Able to stand independently and safely and complete 8 steps in 20 seconds   Standing Unsupported, One Foot in Front Able to place foot tandem independently and hold 30 seconds   Standing on One Leg Able to lift leg independently and hold 5-10 seconds   Total Score 55   Dynamic Gait Index   Level Surface Normal   Change in Gait Speed Normal   Gait with Horizontal Head Turns Normal   Gait with Vertical Head Turns Normal   Gait and Pivot Turn Normal   Step Over Obstacle Normal   Step Around Obstacles Normal   Steps Normal   Total Score 24                               PT Short Term Goals - 07/02/14 1125    PT SHORT TERM GOAL #1   Title Patient will demonstrate inbcreased hip abduction strength bilaterally to 4/5 MMT to decrease trendelenberg gait    Time 4   Period Weeks   Status Achieved   PT SHORT TERM GOAL #2   Title Patient will dmeonstrate increased tensor fascia latae and glut mobility as indicated by a negative obers test indicating improved hip mobility for gait.    Status Achieved   PT SHORT TERM GOAL #3   Title Patient will be I with HEP.    Time 4   Period Weeks   Status Achieved           PT Long Term Goals - July 02, 2014 1126    PT LONG TERM GOAL #1   Title Patient will demonstrate increased gait speed to >1.65ms indicating community ambuloatory status.    Time 4   Period Weeks   Status Achieved   PT LONG TERM GOAL #2   Title Patient will be able to ambulate up and down stairs with 10lb dumbbell or 2x 5lb dumbbells.    Time 4   Period Weeks   Status Achieved   PT LONG TERM GOAL #3   Title Patient will state no fear of falling.    Time 4   Period Weeks   Status Achieved                 G-Codes - 006/26/161132    Functional Assessment Tool Used based on strength and balance tests   Functional Limitation Mobility: Walking and moving around   Mobility: Walking and  Moving Around Goal Status [phone] impaired, limited or restricted   Mobility: Walking and Moving Around Discharge Status [phone] impaired, limited or restricted  Problem List Patient Active Problem List   Diagnosis Date Noted  . Iron deficiency anemia 01/22/2014  . Anxiety state 01/16/2014  . Cough 01/16/2014  . Left displaced femoral neck fracture 01/08/2014  . Chronic constipation 02/03/2011  . Hypertension 02/03/2011  Rayetta Humphrey, PT CLT [phone] 06/30/2014, 11:33 AM  [facility] [address] [place], [name], [address] Phone: [phone]   Fax:  [phone]   PHYSICAL THERAPY DISCHARGE SUMMARY  Plan: Patient agrees to discharge.  Patient goals were met. Patient is being discharged due to meeting the stated rehab goals.  ?????     Rayetta Humphrey, [place] CLT [phone]

## 2014-07-07 ENCOUNTER — Ambulatory Visit (HOSPITAL_COMMUNITY): Payer: Medicare Other | Attending: Internal Medicine | Admitting: Physical Therapy

## 2014-10-04 ENCOUNTER — Other Ambulatory Visit (HOSPITAL_COMMUNITY): Payer: Self-pay | Admitting: Internal Medicine

## 2014-10-04 DIAGNOSIS — R412 Retrograde amnesia: Secondary | ICD-10-CM

## 2014-10-18 ENCOUNTER — Ambulatory Visit (HOSPITAL_COMMUNITY)
Admission: RE | Admit: 2014-10-18 | Discharge: 2014-10-18 | Disposition: A | Discharge status: Home / Self Care | Payer: Medicare Other | Source: Ambulatory Visit | Attending: Internal Medicine | Admitting: Internal Medicine

## 2014-10-18 DIAGNOSIS — J322 Chronic ethmoidal sinusitis: Secondary | ICD-10-CM | POA: Insufficient documentation

## 2014-10-18 DIAGNOSIS — J320 Chronic maxillary sinusitis: Secondary | ICD-10-CM | POA: Insufficient documentation

## 2014-10-18 DIAGNOSIS — R413 Other amnesia: Secondary | ICD-10-CM | POA: Insufficient documentation

## 2014-10-18 DIAGNOSIS — R412 Retrograde amnesia: Secondary | ICD-10-CM

## 2015-07-25 IMAGING — CR DG HIP (WITH OR WITHOUT PELVIS) 2-3V*L*
3 series · 3 of 3 positions shown · non-contrast
Comparison: None.

CLINICAL DATA: Fall.  Hip pain

EXAM:
DG HIP W/ PELVIS 2-3V*L*

[view not recorded (1 of 3)]
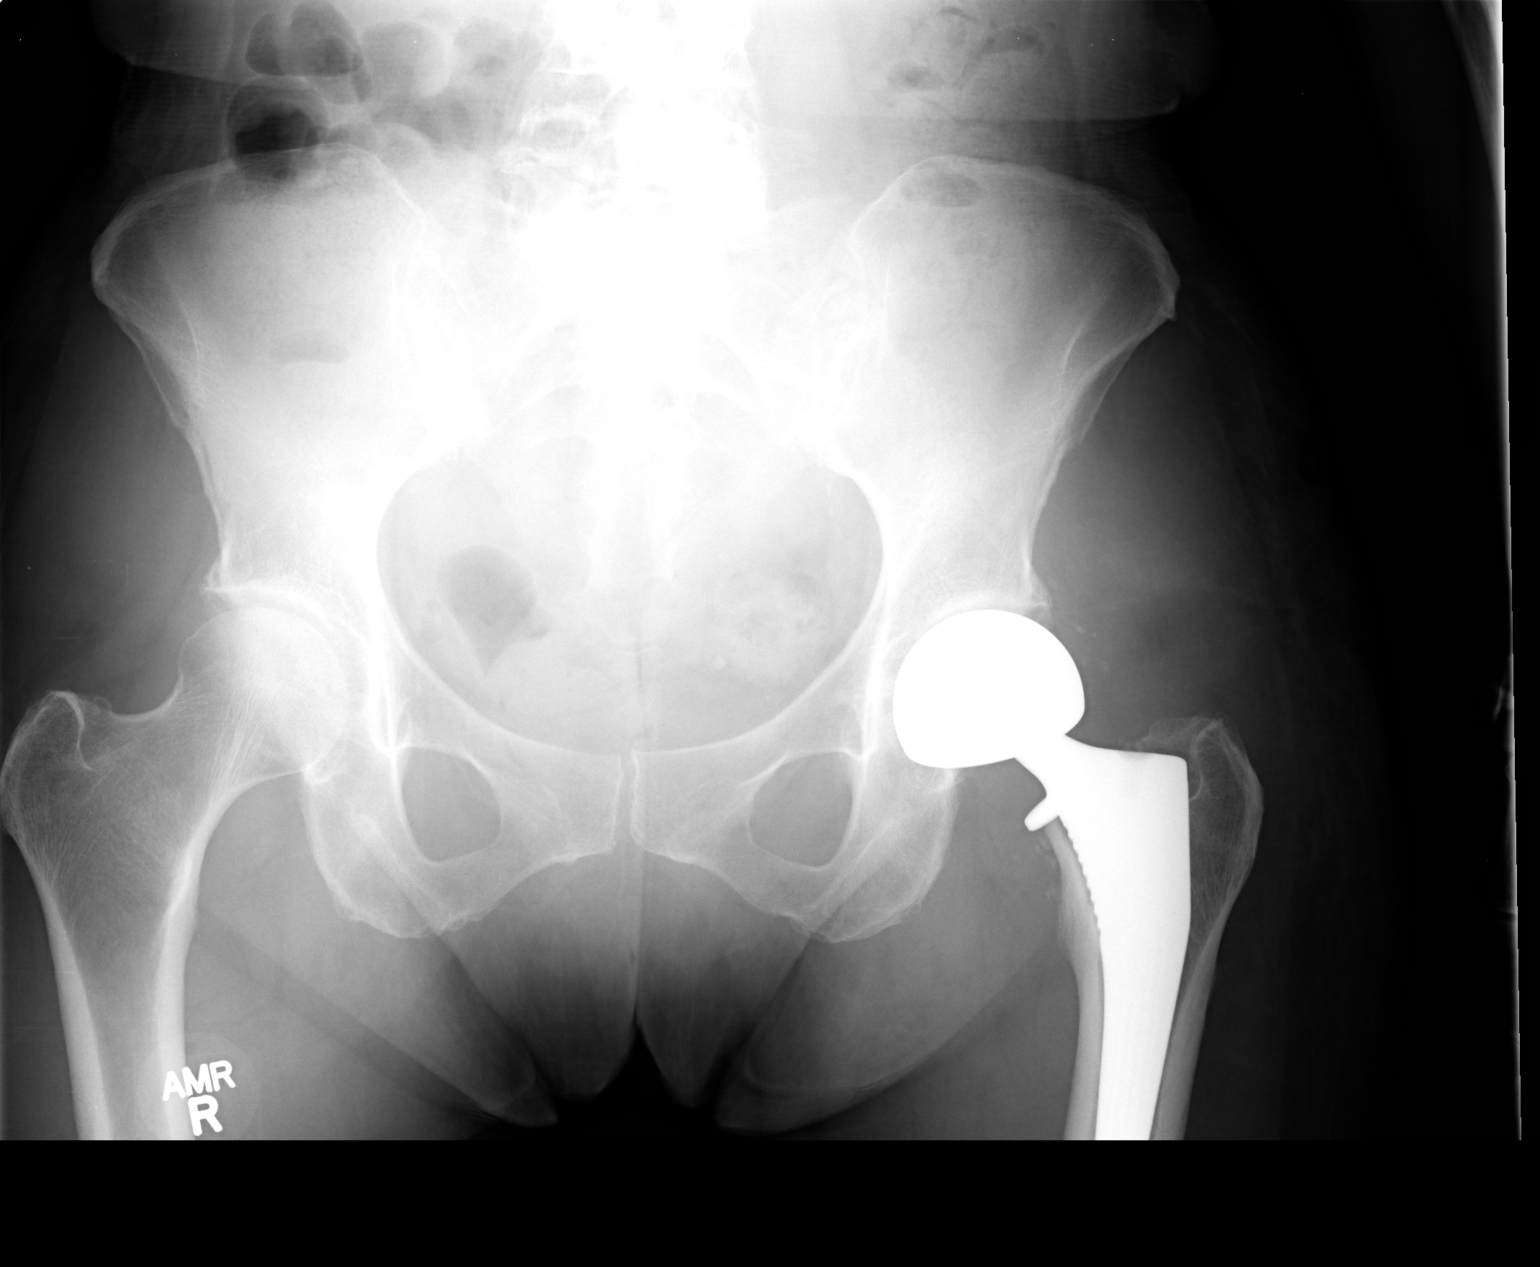

[view not recorded (2 of 3)]
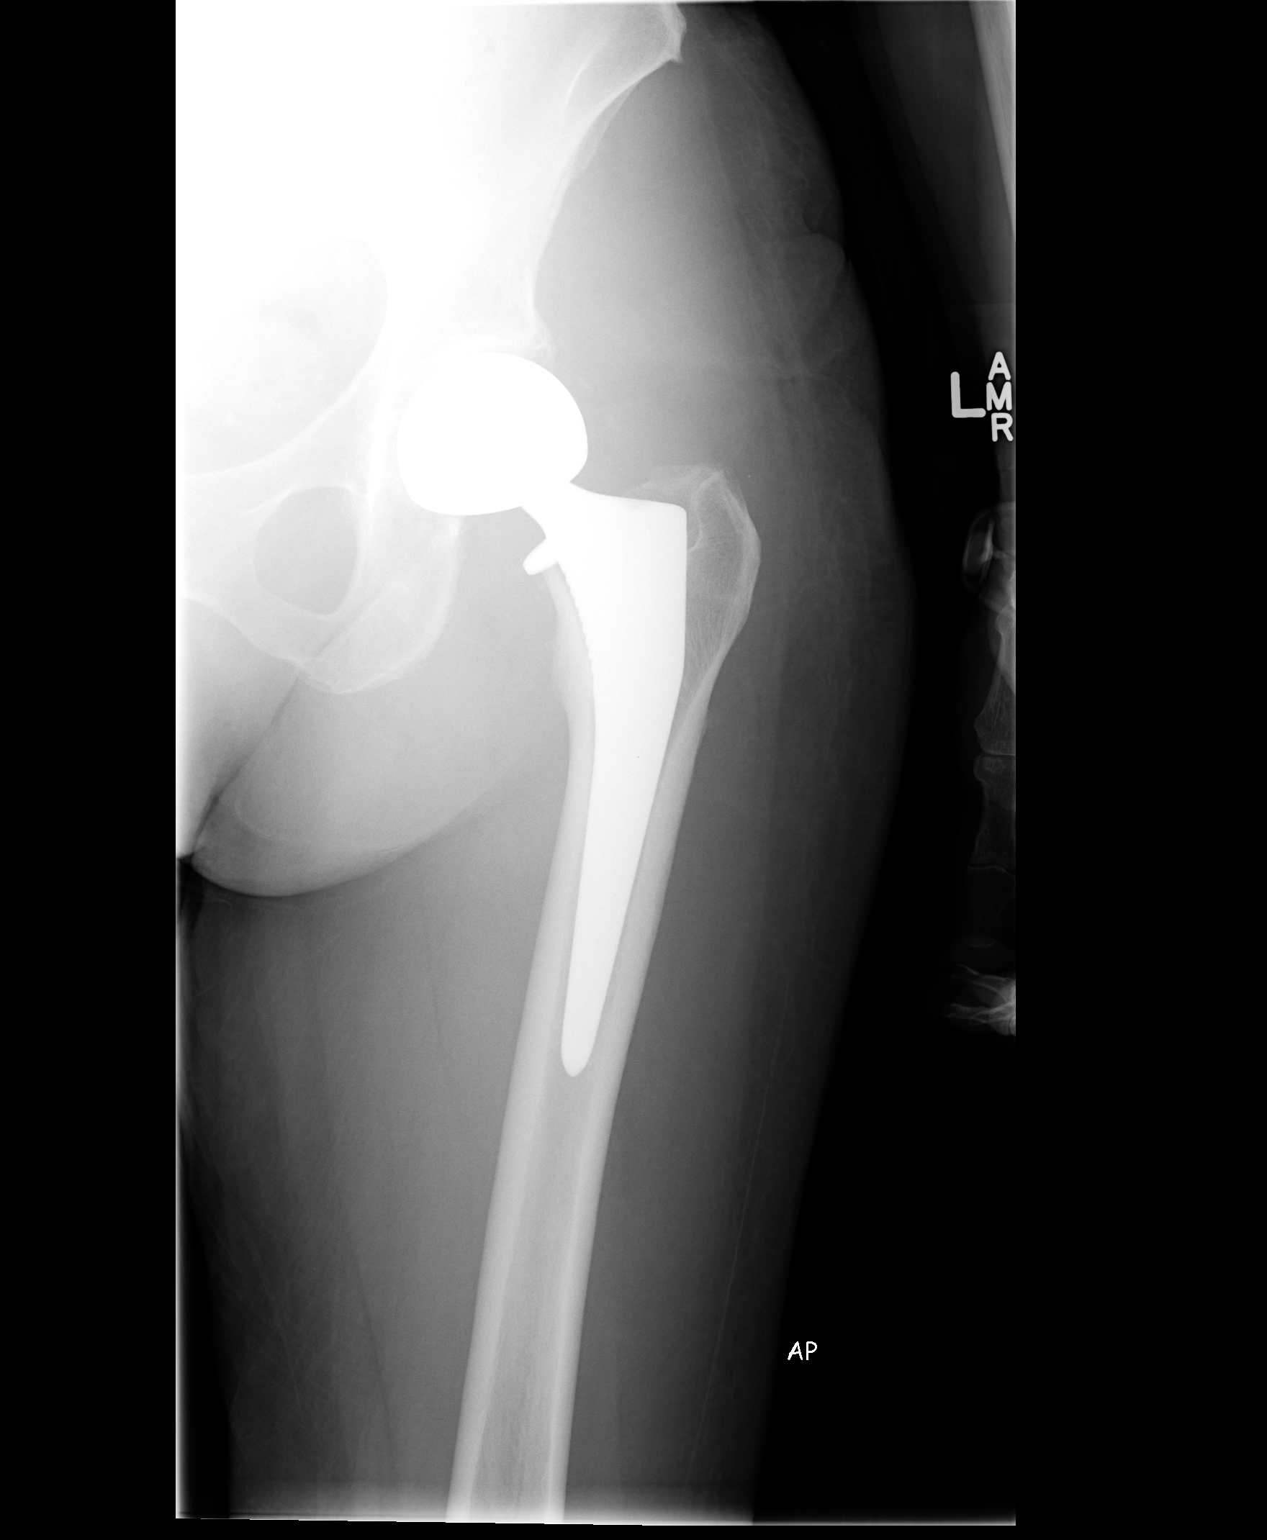

[view not recorded (3 of 3)]
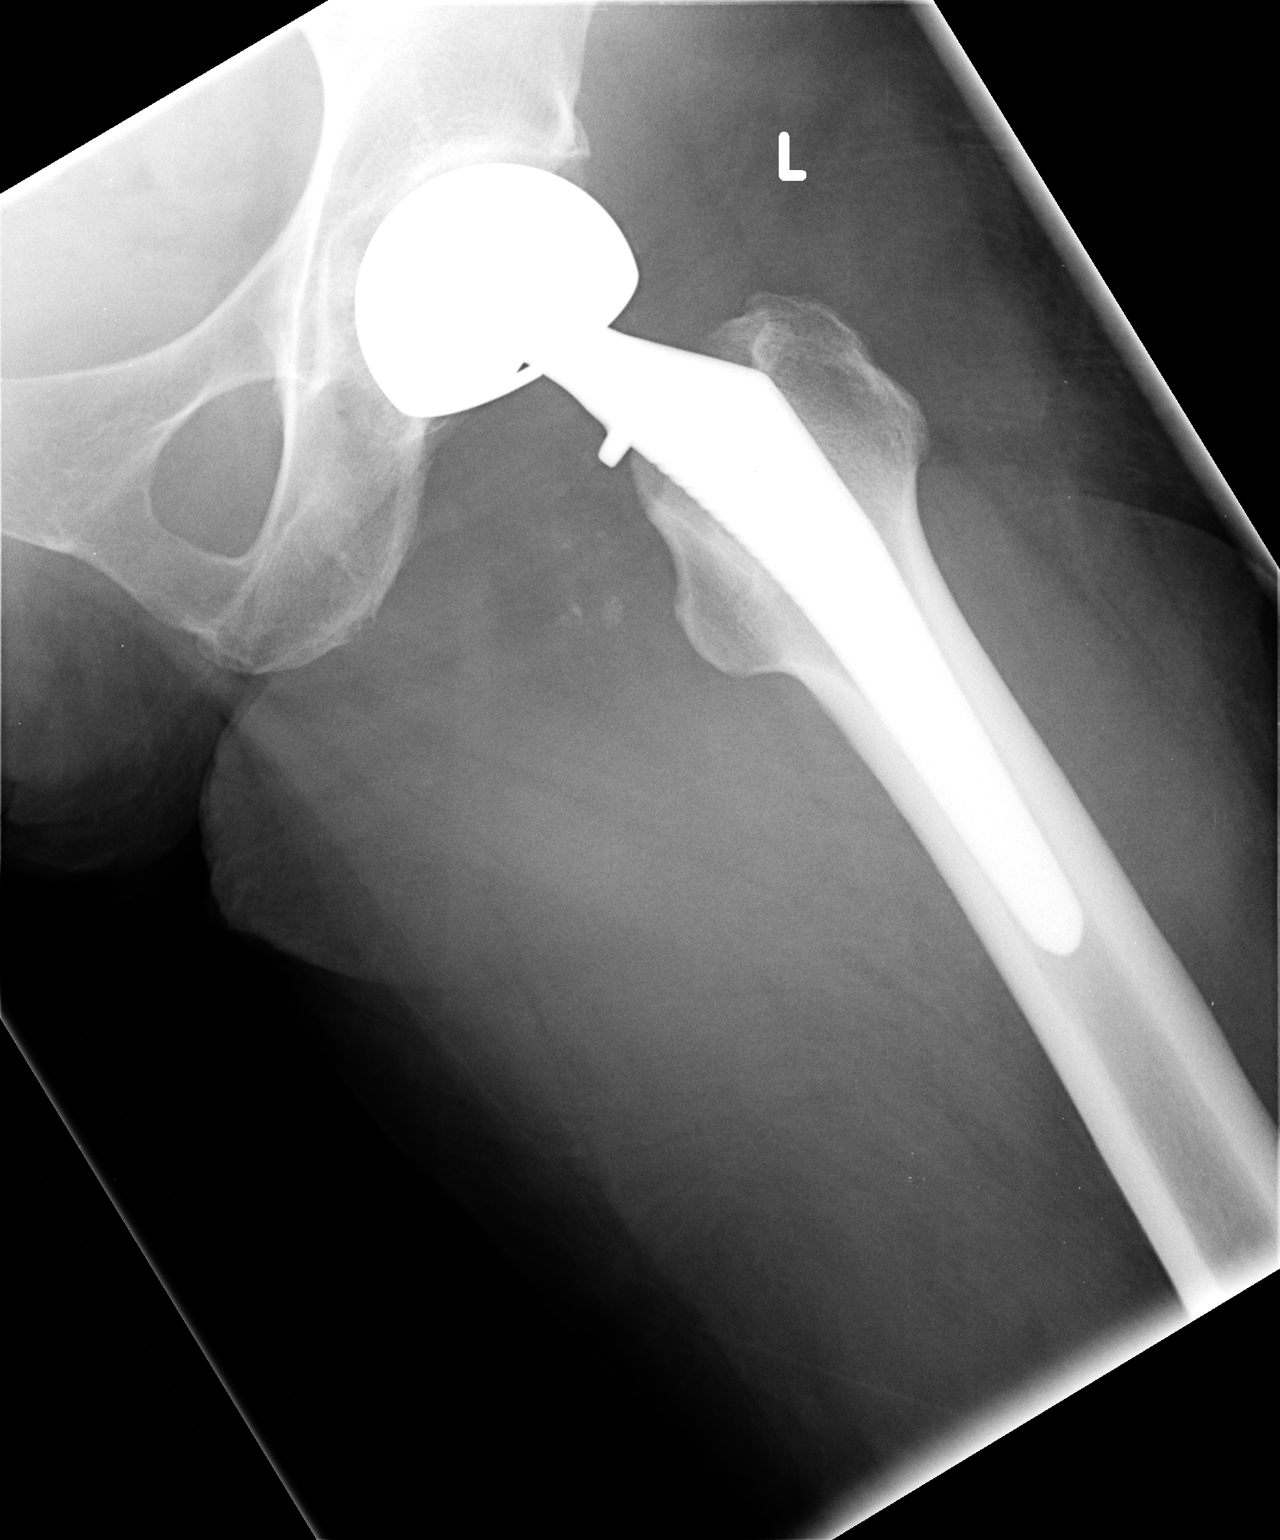

[3 of 3 positions shown; findings below may reference images not displayed]

FINDINGS: Left hip hemiarthroplasty in satisfactory position alignment. No
fracture or dislocation. No radiographic abnormality related to the
prosthesis.
IMPRESSION: No acute abnormality.

## 2015-09-15 ENCOUNTER — Emergency Department (HOSPITAL_COMMUNITY): Payer: Medicare Other

## 2015-09-15 ENCOUNTER — Encounter (HOSPITAL_COMMUNITY): Payer: Self-pay | Admitting: Emergency Medicine

## 2015-09-15 ENCOUNTER — Emergency Department (HOSPITAL_COMMUNITY)
Admission: EM | Admit: 2015-09-15 | Discharge: 2015-09-15 | Disposition: A | Discharge status: Home / Self Care | Payer: Medicare Other | Attending: Emergency Medicine | Admitting: Emergency Medicine

## 2015-09-15 DIAGNOSIS — N390 Urinary tract infection, site not specified: Secondary | ICD-10-CM | POA: Insufficient documentation

## 2015-09-15 DIAGNOSIS — I10 Essential (primary) hypertension: Secondary | ICD-10-CM | POA: Insufficient documentation

## 2015-09-15 DIAGNOSIS — Z79899 Other long term (current) drug therapy: Secondary | ICD-10-CM | POA: Diagnosis not present

## 2015-09-15 DIAGNOSIS — Z7982 Long term (current) use of aspirin: Secondary | ICD-10-CM | POA: Diagnosis not present

## 2015-09-15 DIAGNOSIS — Z87891 Personal history of nicotine dependence: Secondary | ICD-10-CM | POA: Insufficient documentation

## 2015-09-15 DIAGNOSIS — R319 Hematuria, unspecified: Secondary | ICD-10-CM | POA: Diagnosis present

## 2015-09-15 LAB — URINALYSIS, ROUTINE W REFLEX MICROSCOPIC
GLUCOSE, UA: NEGATIVE mg/dL
Nitrite: POSITIVE — AB
Protein, ur: >300 mg/dL — AB
Specific Gravity, Urine: 1.025 (ref 1.005–1.030)
pH: 6 (ref 5.0–8.0)

## 2015-09-15 LAB — URINE MICROSCOPIC-ADD ON

## 2015-09-15 MED ORDER — CIPROFLOXACIN HCL 500 MG PO TABS: 500.0000 mg | ORAL_TABLET | Freq: Two times a day (BID) | ORAL | 0 refills | Status: DC

## 2015-09-15 NOTE — ED Notes (Signed)
Pt ambulatory to room with steady and even gait.

## 2015-09-15 NOTE — Discharge Instructions (Signed)
Take the prescription as directed. Your CT scan showed an incidental finding:  "Note made of a punctate (approximately 2.4 cm) hypo attenuating right-sided adnexal lesion. Follow up ultrasound is recommended in 1 year." Your regular medical doctor can follow this finding. Call your regular medical doctor on Monday to schedule a follow up appointment within the next 2 to 3 days. Return to the Emergency Department immediately sooner if worsening.

## 2015-09-15 NOTE — ED Triage Notes (Signed)
Pt reports she began having burning with urination this morning.  Denies n/v or abd pain.

## 2015-09-15 NOTE — ED Provider Notes (Signed)
[facility] DEPT Provider Note   CSN: [id number] Arrival date & time: 09/15/15  1052     History   Chief Complaint Chief Complaint  Patient presents with  . Dysuria  . Hematuria    HPI Jackie Phelps is a 82 y.o. female.  HPI  Pt was seen at 1210. Per pt, c/o gradual onset and persistence of constant hematuria and mild dysuria that began this morning. Has been associated with no other symptoms.  Denies flank pain, no fevers, no abd pain, no N/V/D, no vaginal bleeding/discharge, no rash.   Past Medical History:  Diagnosis Date  . Anxiety   . Constipation   . Hypercholesteremia   . Hypertension     Patient Active Problem List   Diagnosis Date Noted  . Iron deficiency anemia 01/22/2014  . Anxiety state 01/16/2014  . Cough 01/16/2014  . Left displaced femoral neck fracture ([place]) 01/08/2014  . Chronic constipation 02/03/2011  . Hypertension 02/03/2011    Past Surgical History:  Procedure Laterality Date  . COLONOSCOPY    . COLONOSCOPY  03/05/2011   Procedure: COLONOSCOPY;  Surgeon: Rogene Houston, MD;  [facility];  Service: Endoscopy;  Laterality: N/A;  1200  . HIP ARTHROPLASTY Left 01/08/2014   Procedure: ARTHROPLASTY BIPOLAR HIP;  Surgeon: Marianna Payment, MD;  Location: [facility];  Service: Orthopedics;  Laterality: Left;       Home Medications    Prior to Admission medications   Medication Sig Start Date End Date Taking? Authorizing Provider  amLODipine (NORVASC) 5 MG tablet Take 1 tablet by mouth daily. 08/28/15  Yes Historical Provider, MD  aspirin 81 MG tablet Take 81 mg by mouth daily.    Yes Historical Provider, MD  carvedilol (COREG) 6.25 MG tablet Take 12.5 mg by mouth 2 (two) times daily with a meal.    Yes Historical Provider, MD  diazepam (VALIUM) 5 MG tablet Take one tablet by mouth twice daily as needed for anxiety 01/13/14  Yes Tiffany L Reed, DO  donepezil (ARICEPT) 5 MG tablet Take 5 mg by mouth at bedtime.   Yes Historical  Provider, MD  escitalopram (LEXAPRO) 5 MG tablet Take 5 mg by mouth daily.  12/07/13  Yes Historical Provider, MD  ferrous sulfate 325 (65 FE) MG tablet Take 325 mg by mouth 2 (two) times daily with a meal.   Yes Historical Provider, MD  multivitamin-lutein (OCUVITE-LUTEIN) CAPS Take 1 capsule by mouth daily.   Yes Historical Provider, MD  simvastatin (ZOCOR) 20 MG tablet Take 20 mg by mouth daily.   Yes Historical Provider, MD  oxyCODONE (OXY IR/ROXICODONE) 5 MG immediate release tablet 1 by mouth every 3 hours as needed for pain Patient not taking: Reported on 09/15/2015 01/16/14   Gayland Curry, DO    Family History Family History  Problem Relation Age of Onset  . Hypertension Mother   . Healthy Daughter   . Colon cancer Neg Hx     Social History Social History  Substance Use Topics  . Smoking status: Former Smoker    Types: Cigarettes    Quit date: 02/02/1966  . Smokeless tobacco: Never Used  . Alcohol use 1.2 oz/week    2 Glasses of wine per week     Comment: Drinks wine     Allergies   Penicillins   Review of Systems Review of Systems ROS: Statement: All systems negative except as marked or noted in the HPI; Constitutional: Negative for fever and chills. ; ;  Eyes: Negative for eye pain, redness and discharge. ; ; ENMT: Negative for ear pain, hoarseness, nasal congestion, sinus pressure and sore throat. ; ; Cardiovascular: Negative for chest pain, palpitations, diaphoresis, dyspnea and peripheral edema. ; ; Respiratory: Negative for cough, wheezing and stridor. ; ; Gastrointestinal: Negative for nausea, vomiting, diarrhea, abdominal pain, blood in stool, hematemesis, jaundice and rectal bleeding. . ; ; Genitourinary: +hematuria, dysuria. Negative for flank pain. ; ; Musculoskeletal: Negative for back pain and neck pain. Negative for swelling and trauma.; ; Skin: Negative for pruritus, rash, abrasions, blisters, bruising and skin lesion.; ; Neuro: Negative for headache,  lightheadedness and neck stiffness. Negative for weakness, altered level of consciousness, altered mental status, extremity weakness, paresthesias, involuntary movement, seizure and syncope.      Physical Exam Updated Vital Signs BP 132/66 (BP Location: Left Arm)   Pulse 90   Temp 97.9 F (36.6 C) (Oral)   Resp 16   Ht 5\' 2"  (1.575 m)   Wt 115 lb (52.2 kg)   SpO2 98%   BMI 21.03 kg/m   Physical Exam 1215: Physical examination:  Nursing notes reviewed; Vital signs and O2 SAT reviewed;  Constitutional: Well developed, Well nourished, Well hydrated, In no acute distress; Head:  Normocephalic, atraumatic; Eyes: EOMI, PERRL, No scleral icterus; ENMT: Mouth and pharynx normal, Mucous membranes moist; Neck: Supple, Full range of motion, No lymphadenopathy; Cardiovascular: Regular rate and rhythm, No gallop; Respiratory: Breath sounds clear & equal bilaterally, No wheezes.  Speaking full sentences with ease, Normal respiratory effort/excursion; Chest: Nontender, Movement normal; Abdomen: Soft, Nontender, Nondistended, Normal bowel sounds; Genitourinary: No CVA tenderness; Spine:  No midline CS, TS, LS tenderness.;; Extremities: Pulses normal, No tenderness, No edema, No calf edema or asymmetry.; Neuro: AA&Ox3, Major CN grossly intact.  Speech clear. No gross focal motor or sensory deficits in extremities. Climbs on and off chair in exam room easily by herself. Gait steady.; Skin: Color normal, Warm, Dry.   ED Treatments / Results  Labs (all labs ordered are listed, but only abnormal results are displayed)   EKG  EKG Interpretation None       Radiology   Procedures Procedures (including critical care time)  Medications Ordered in ED Medications - No data to display   Initial Impression / Assessment and Plan / ED Course  I have reviewed the triage vital signs and the nursing notes.  Pertinent labs & imaging results that were available during my care of the patient were reviewed  by me and considered in my medical decision making (see chart for details).  MDM Reviewed: previous chart, nursing note and vitals Reviewed previous: labs Interpretation: labs and CT scan   Results for orders placed or performed during the hospital encounter of 09/15/15  Urinalysis, Routine w reflex microscopic (not at [facility])  Result Value Ref Range   Color, Urine AMBER (A) YELLOW   APPearance CLOUDY (A) CLEAR   Specific Gravity, Urine 1.025 1.005 - 1.030   pH 6.0 5.0 - 8.0   Glucose, UA NEGATIVE NEGATIVE mg/dL   Hgb urine dipstick LARGE (A) NEGATIVE   Bilirubin Urine SMALL (A) NEGATIVE   Ketones, ur TRACE (A) NEGATIVE mg/dL   Protein, ur >300 (A) NEGATIVE mg/dL   Nitrite POSITIVE (A) NEGATIVE   Leukocytes, UA MODERATE (A) NEGATIVE  Urine microscopic-add on  Result Value Ref Range   Squamous Epithelial / LPF 6-30 (A) NONE SEEN   WBC, UA TOO NUMEROUS TO COUNT 0 - 5 WBC/hpf   RBC /  HPF TOO NUMEROUS TO COUNT 0 - 5 RBC/hpf   Bacteria, UA MANY (A) NONE SEEN   Ct Renal Stone Study Result Date: 09/15/2015 CLINICAL DATA:  Painful urination.  1 episode of hematuria. EXAM: CT ABDOMEN AND PELVIS WITHOUT CONTRAST TECHNIQUE: Multidetector CT imaging of the abdomen and pelvis was performed following the standard protocol without IV contrast. COMPARISON:  None. FINDINGS: The lack of intravenous contrast limits the ability to evaluate solid abdominal organs. Lower chest: Limited visualization of the lower thorax demonstrates minimal dependent subpleural ground-glass in slightly nodular atelectasis within the bilateral lower lobes. No focal airspace opacities. No pleural effusion. No discrete pulmonary nodules. Normal heart size. No pericardial effusion. Coronary artery calcifications. Hepatobiliary: Normal hepatic contour. Normal noncontrast appearance of the gallbladder given degree distention. No radiopaque gallstones. No ascites. Pancreas: Normal noncontrast appearance of the pancreas Spleen: Normal  noncontrast appearance of the spleen. In cell note is made of a small splenule. Adrenals/Urinary Tract: Note is made of a 2.4 cm hypo attenuating (7 Hounsfield unit) cyst within the posterior aspect of the inferior pole the right kidney (image 46, series 2), incompletely characterized on this noncontrast examination though favored to represent a renal cyst. No discrete left-sided renal lesions. No renal stones. No renal stones are seen along the expected course of either ureter or the urinary bladder. Normal noncontrast appearance of the urinary bladder given degree distention though evaluation somewhat degraded secondary the patient's left total hip prosthesis. Normal noncontrast appearance the bilateral adrenal glands. Stomach/Bowel: Moderate colonic stool burden without evidence of enteric obstruction. Colonic diverticulosis without evidence of diverticulitis. Normal noncontrast appearance of the terminal ileum and the retrocecal appendix. No pneumoperitoneum, pneumatosis or portal venous gas. Vascular/Lymphatic: Scattered calcified atherosclerotic plaque within a normal caliber abdominal aorta. No bulky retroperitoneal, mesenteric, pelvic or inguinal lymphadenopathy on this noncontrast examination. Reproductive: Note is made of a punctate (approximately 2.4 x 1.8 cm hypo attenuating (-11 Hounsfield unit) right-sided adnexal lesion (image 78, series 2). Otherwise, normal noncontrast appearance of the pelvic organs. No free fluid in the pelvic cul-de-sac Other: Tiny mesenteric fat containing periumbilical hernia. Regional soft tissues appear otherwise normal. Musculoskeletal: No acute or aggressive osseous abnormalities. Post left bipolar hip replacement. Mild-to-moderate bilateral facet degenerative change of the lower lumbar spine. IMPRESSION: 1. No explanation for patient's painful urination hematuria. Specifically, no evidence of nephrolithiasis urinary obstruction. 2. No made of an approximately 2.4 cm  right-sided hypo attenuating renal lesion, too small to adequately characterize of favored to represent renal cysts. 3. Colonic diverticulosis without evidence of diverticulitis. 4.  Aortic Atherosclerosis (ICD10-170.0) 5. **An incidental finding of potential clinical significance has been found. Note made of a punctate (approximately 2.4 cm) hypo attenuating right-sided adnexal lesion. Correlation with prior outside examinations (if available) is recommended. Otherwise a follow up ultrasound is recommended in 1 year according to the Society of Radiologists in [place] Statement (D Clovis Riley et al. Management of Asymptomatic Ovarian and Other Adnexal Cysts Imaged at [place]: Society of Radiologists in [place] Statement 2010. Radiology 256 (Sept 2010): 943-954.).** Electronically Signed   By: Sandi Mariscal M.D.   On: 09/15/2015 14:12    1425:  Will tx for UTI with cipro while UC is pending. Dx and testing d/w pt and family.  Questions answered.  Verb understanding, agreeable to d/c home with outpt f/u.  Final Clinical Impressions(s) / ED Diagnoses   Final diagnoses:  Hematuria    New Prescriptions New Prescriptions   No medications on file  Francine Graven, DO 09/18/15 1703

## 2015-09-17 LAB — URINE CULTURE

## 2015-10-19 ENCOUNTER — Ambulatory Visit: Payer: Medicare Other | Attending: Neurology | Admitting: Neurology

## 2015-10-19 VITALS — Ht 62.0 in | Wt 110.0 lb

## 2015-10-19 DIAGNOSIS — G4733 Obstructive sleep apnea (adult) (pediatric): Secondary | ICD-10-CM | POA: Diagnosis present

## 2015-10-27 NOTE — Procedures (Signed)
[place] A. Merlene Laughter, MD     [email or web address]             NOCTURNAL POLYSOMNOGRAPHY   LOCATION: [facility]  Patient Name: Jackie Phelps, Jackie Phelps Date: 10/19/2015 Gender: Female D.O.B: 1931/06/27 Age (years): 44 Referring Provider: Phillips Odor MD, ABSM Height (inches): 62 Interpreting Physician: Phillips Odor MD, ABSM Weight (lbs): 110 RPSGT: Peak, Robert BMI: 20 MRN: [id number] Neck Size: 15.00 CLINICAL INFORMATION Sleep Study Type: NPSG Indication for sleep study: N/A Epworth Sleepiness Score: 5 SLEEP STUDY TECHNIQUE As per the AASM Manual for the Scoring of Sleep and Associated Events v2.3 (April 2016) with a hypopnea requiring 4% desaturations. The channels recorded and monitored were frontal, central and occipital EEG, electrooculogram (EOG), submentalis EMG (chin), nasal and oral airflow, thoracic and abdominal wall motion, anterior tibialis EMG, snore microphone, electrocardiogram, and pulse oximetry. MEDICATIONS Medications self-administered by patient taken the night of the study : N/A  Current Outpatient Prescriptions:  .  amLODipine (NORVASC) 5 MG tablet, Take 1 tablet by mouth daily., Disp: , Rfl: 2 .  aspirin 81 MG tablet, Take 81 mg by mouth daily. , Disp: , Rfl:  .  carvedilol (COREG) 6.25 MG tablet, Take 12.5 mg by mouth 2 (two) times daily with a meal. , Disp: , Rfl:  .  ciprofloxacin (CIPRO) 500 MG tablet, Take 1 tablet (500 mg total) by mouth 2 (two) times daily., Disp: 14 tablet, Rfl: 0 .  diazepam (VALIUM) 5 MG tablet, Take one tablet by mouth twice daily as needed for anxiety, Disp: 60 tablet, Rfl: 0 .  donepezil (ARICEPT) 5 MG tablet, Take 5 mg by mouth at bedtime., Disp: , Rfl:  .  escitalopram (LEXAPRO) 5 MG tablet, Take 5 mg by mouth daily. , Disp: , Rfl: 0 .  ferrous sulfate 325 (65 FE) MG tablet, Take 325 mg by mouth 2 (two) times daily with a meal., Disp: , Rfl:  .  multivitamin-lutein (OCUVITE-LUTEIN) CAPS, Take 1  capsule by mouth daily., Disp: , Rfl:  .  oxyCODONE (OXY IR/ROXICODONE) 5 MG immediate release tablet, 1 by mouth every 3 hours as needed for pain (Patient not taking: Reported on 09/15/2015), Disp: 240 tablet, Rfl: 0 .  simvastatin (ZOCOR) 20 MG tablet, Take 20 mg by mouth daily., Disp: , Rfl:   SLEEP ARCHITECTURE The study was initiated at 10:41:15 PM and ended at 5:09:00 AM. Sleep onset time was 56.0 minutes and the sleep efficiency was 59.4%. The total sleep time was 230.5 minutes. Stage REM latency was N/A minutes. The patient spent 51.84% of the night in stage N1 sleep, 48.16% in stage N2 sleep, 0.00% in stage N3 and 0.00% in REM. Alpha intrusion was absent. Supine sleep was 100.00%. RESPIRATORY PARAMETERS The overall apnea/hypopnea index (AHI) was 53.9 per hour. There were 126 total apneas, including 25 obstructive, 47 central and 54 mixed apneas. There were 81 hypopneas and 30 RERAs. The AHI during Stage REM sleep was N/A per hour. AHI while supine was 53.9 per hour. The mean oxygen saturation was 94.09%. The minimum SpO2 during sleep was 80.00%. Moderate snoring was noted during this study. CARDIAC DATA The 2 lead EKG demonstrated sinus rhythm. The mean heart rate was 68.89 beats per minute. Other EKG findings include: PVCs. LEG MOVEMENT DATA The total PLMS were 0 with a resulting PLMS index of 0.00. Associated arousal with leg movement index was 0.0.   IMPRESSIONS - Severe obstructive sleep apnea. - Abnormal sleep architecture with poor sleep efficiency, absent slow  wave sleep and absent REM sleep.    Delano Metz, MD Diplomate, American Board of Sleep Medicine.

## 2015-11-06 ENCOUNTER — Emergency Department (HOSPITAL_COMMUNITY): Payer: Medicare Other

## 2015-11-06 ENCOUNTER — Emergency Department (HOSPITAL_COMMUNITY)
Admission: EM | Admit: 2015-11-06 | Discharge: 2015-11-06 | Disposition: A | Discharge status: Home / Self Care | Payer: Medicare Other | Attending: Emergency Medicine | Admitting: Emergency Medicine

## 2015-11-06 ENCOUNTER — Encounter (HOSPITAL_COMMUNITY): Payer: Self-pay | Admitting: Emergency Medicine

## 2015-11-06 DIAGNOSIS — Z79899 Other long term (current) drug therapy: Secondary | ICD-10-CM | POA: Diagnosis not present

## 2015-11-06 DIAGNOSIS — Y929 Unspecified place or not applicable: Secondary | ICD-10-CM | POA: Insufficient documentation

## 2015-11-06 DIAGNOSIS — K5900 Constipation, unspecified: Secondary | ICD-10-CM | POA: Insufficient documentation

## 2015-11-06 DIAGNOSIS — Y999 Unspecified external cause status: Secondary | ICD-10-CM | POA: Diagnosis not present

## 2015-11-06 DIAGNOSIS — I10 Essential (primary) hypertension: Secondary | ICD-10-CM | POA: Diagnosis not present

## 2015-11-06 DIAGNOSIS — S6392XA Sprain of unspecified part of left wrist and hand, initial encounter: Secondary | ICD-10-CM | POA: Diagnosis not present

## 2015-11-06 DIAGNOSIS — Y93H2 Activity, gardening and landscaping: Secondary | ICD-10-CM | POA: Diagnosis not present

## 2015-11-06 DIAGNOSIS — Z87891 Personal history of nicotine dependence: Secondary | ICD-10-CM | POA: Insufficient documentation

## 2015-11-06 DIAGNOSIS — W1839XA Other fall on same level, initial encounter: Secondary | ICD-10-CM | POA: Diagnosis not present

## 2015-11-06 DIAGNOSIS — Z7982 Long term (current) use of aspirin: Secondary | ICD-10-CM | POA: Insufficient documentation

## 2015-11-06 DIAGNOSIS — S6992XA Unspecified injury of left wrist, hand and finger(s), initial encounter: Secondary | ICD-10-CM | POA: Diagnosis present

## 2015-11-06 NOTE — ED Notes (Signed)
Pt is unable to get rings off of her left ring finger. Pt refuses to have her rings cut. Pt is aware of risks involved in leaving the rings on.

## 2015-11-06 NOTE — ED Provider Notes (Signed)
[facility] DEPT Provider Note   CSN: [id number] Arrival date & time: 11/06/15  [id number]     History   Chief Complaint Chief Complaint  Patient presents with  . Hand Injury    HPI Jackie Phelps is a 82 y.o. female.  Patient is an 82 year old female who presents to the emergency department with a complaint of left hand and wrist area pain.  The patient states that on yesterday October 30 she was watering plants, when she lost her footing and fell. She injured the left hand. She noted swelling. She applied ice on last evening, but noticed even more swelling and pain this morning. She states she was unable to get her rings off because of the swelling. She denies any injury to other areas of her body. She did not lose consciousness. Patient denies being on any anticoagulation medications. She has no history of any bleeding disorders. No previous operations or procedures involving the left hand and wrist area.    Hand Injury      Past Medical History:  Diagnosis Date  . Anxiety   . Constipation   . Hypercholesteremia   . Hypertension     Patient Active Problem List   Diagnosis Date Noted  . Iron deficiency anemia 01/22/2014  . Anxiety state 01/16/2014  . Cough 01/16/2014  . Left displaced femoral neck fracture ([place]) 01/08/2014  . Chronic constipation 02/03/2011  . Hypertension 02/03/2011    Past Surgical History:  Procedure Laterality Date  . COLONOSCOPY    . COLONOSCOPY  03/05/2011   Procedure: COLONOSCOPY;  Surgeon: Rogene Houston, MD;  [facility];  Service: Endoscopy;  Laterality: N/A;  1200  . HIP ARTHROPLASTY Left 01/08/2014   Procedure: ARTHROPLASTY BIPOLAR HIP;  Surgeon: Marianna Payment, MD;  Location: [facility];  Service: Orthopedics;  Laterality: Left;    OB History    No data available       Home Medications    Prior to Admission medications   Medication Sig Start Date End Date Taking? Authorizing Provider  amLODipine (NORVASC) 5 MG  tablet Take 1 tablet by mouth daily. 08/28/15   Historical Provider, MD  aspirin 81 MG tablet Take 81 mg by mouth daily.     Historical Provider, MD  carvedilol (COREG) 6.25 MG tablet Take 12.5 mg by mouth 2 (two) times daily with a meal.     Historical Provider, MD  ciprofloxacin (CIPRO) 500 MG tablet Take 1 tablet (500 mg total) by mouth 2 (two) times daily. 09/15/15   Francine Graven, DO  diazepam (VALIUM) 5 MG tablet Take one tablet by mouth twice daily as needed for anxiety 01/13/14   Tiffany L Reed, DO  donepezil (ARICEPT) 5 MG tablet Take 5 mg by mouth at bedtime.    Historical Provider, MD  escitalopram (LEXAPRO) 5 MG tablet Take 5 mg by mouth daily.  12/07/13   Historical Provider, MD  ferrous sulfate 325 (65 FE) MG tablet Take 325 mg by mouth 2 (two) times daily with a meal.    Historical Provider, MD  multivitamin-lutein (OCUVITE-LUTEIN) CAPS Take 1 capsule by mouth daily.    Historical Provider, MD  oxyCODONE (OXY IR/ROXICODONE) 5 MG immediate release tablet 1 by mouth every 3 hours as needed for pain Patient not taking: Reported on 09/15/2015 01/16/14   Tiffany L Reed, DO  simvastatin (ZOCOR) 20 MG tablet Take 20 mg by mouth daily.    Historical Provider, MD    Family History Family History  Problem Relation Age of Onset  . Hypertension Mother   . Healthy Daughter   . Colon cancer Neg Hx     Social History Social History  Substance Use Topics  . Smoking status: Former Smoker    Types: Cigarettes    Quit date: 02/02/1966  . Smokeless tobacco: Never Used  . Alcohol use 1.2 oz/week    2 Glasses of wine per week     Comment: Drinks wine     Allergies   Penicillins   Review of Systems Review of Systems  Gastrointestinal: Positive for constipation.  Musculoskeletal: Positive for arthralgias.  All other systems reviewed and are negative.    Physical Exam Updated Vital Signs BP 149/68 (BP Location: Right Arm)   Pulse 61   Temp 98.5 F (36.9 C) (Oral)   Resp 16   Ht  5\' 2"  (1.575 m)   Wt 49.9 kg   SpO2 97%   BMI 20.12 kg/m   Physical Exam  Constitutional: She is oriented to person, place, and time. She appears well-developed and well-nourished.  Non-toxic appearance.  HENT:  Head: Normocephalic.  Right Ear: Tympanic membrane and external ear normal.  Left Ear: Tympanic membrane and external ear normal.  Eyes: EOM and lids are normal. Pupils are equal, round, and reactive to light.  Neck: Normal range of motion. Neck supple. Carotid bruit is not present.  Cardiovascular: Normal rate, regular rhythm, normal heart sounds, intact distal pulses and normal pulses.   Pulmonary/Chest: Breath sounds normal. No respiratory distress.  Abdominal: Soft. Bowel sounds are normal. There is no tenderness. There is no guarding.  Musculoskeletal: Normal range of motion.  There is full range of motion of the left shoulder and elbow and wrist. No deformity or pain of the clavicle. There is swelling of the dorsum of the left hand. There is pain to palpation at the MP joint of the ring finger and the little finger. There is swelling of the ring finger and little finger. Patient has rings on the ring finger that cannot be taken off. There no temperature changes of the left hand. The capillary refill is less than 2 seconds.  Lymphadenopathy:       Head (right side): No submandibular adenopathy present.       Head (left side): No submandibular adenopathy present.    She has no cervical adenopathy.  Neurological: She is alert and oriented to person, place, and time. She has normal strength. No cranial nerve deficit or sensory deficit.  Skin: Skin is warm and dry.  Psychiatric: She has a normal mood and affect. Her speech is normal.  Nursing note and vitals reviewed.    ED Treatments / Results  Labs (all labs ordered are listed, but only abnormal results are displayed) Labs Reviewed - No data to display  EKG  EKG Interpretation None       Radiology No results  found.  Procedures Procedures (including critical care time)  Medications Ordered in ED Medications - No data to display   Initial Impression / Assessment and Plan / ED Course  I have reviewed the triage vital signs and the nursing notes.  Pertinent labs & imaging results that were available during my care of the patient were reviewed by me and considered in my medical decision making (see chart for details).  Clinical Course  Patient seen with me by Dr. Oleta Mouse. Rings removed by nursing staff using umbilical tape.  **I have reviewed nursing notes, vital signs, and all appropriate lab  and imaging results for this patient.*  Final Clinical Impressions(s) / ED Diagnoses  X-ray of the left hand is negative for fracture or dislocation. There no neurovascular compromise is appreciated. The patient will be fitted with an Ace wrap for support. She will use this over the next 4 or 5 days. I've invited her to use ice to the area. To use Tylenol on for pain if needed. The patient is to see her primary physician or return to the emergency department if any changes or problems.    Final diagnoses:  None    New Prescriptions New Prescriptions   No medications on file     Lily Kocher, PA-C 11/06/15 [place] Liu, MD 11/06/15 [phone]

## 2015-11-06 NOTE — ED Provider Notes (Signed)
Medical screening examination/treatment/procedure(s) were conducted as a shared visit with non-physician practitioner(s) and myself.  I personally evaluated the patient during the encounter.   EKG Interpretation None      82 -year-old female who presents after mechanical fall. States that she slipped on her pajamas in the garden today and tried catching her fall with the dorsum of the right hand. She did not hit her head or have loss of consciousness. Is not anticoagulated and has a history of hypertension hyperlipidemia. Complains of pain and swelling around the area of her fourth and fifth digits. No other injuries noted on exam. She is otherwise well-appearing no acute distress. With soft tissue swelling of the fourth digit of her hand but no deformities. It is neurovascularly intact. X-ray of the right hand is visualized and is without fracture. Significant soft tissue swelling likely related to her wedding band and engagement ring, and we are unable to get off at bedside. As result, wedding rings are cut off. Will discharge with supportive care management.   Forde Dandy, MD 11/06/15 [phone]

## 2015-11-06 NOTE — ED Triage Notes (Signed)
Patient states she fell late yesterday afternoon while watering plants. Complains of pain and swelling to left hand with bruising to back of left hand. States swelling is preventing her from taking wedding rings off.

## 2015-11-06 NOTE — Discharge Instructions (Signed)
The x-ray of your left hand is negative for fracture or dislocation. I suspect you have a sprain of the fingers of your left hand. Please use the Ace wrap for the next 3 or 4 days. Use Tylenol for pain or discomfort if needed. Please see your primary physician or return to the emergency department if any changes, problems, or concerns.

## 2016-05-21 ENCOUNTER — Ambulatory Visit (INDEPENDENT_AMBULATORY_CARE_PROVIDER_SITE_OTHER): Payer: Medicare Other | Admitting: Urology

## 2016-05-21 DIAGNOSIS — R310 Gross hematuria: Secondary | ICD-10-CM | POA: Diagnosis not present

## 2016-05-23 ENCOUNTER — Other Ambulatory Visit: Payer: Self-pay | Admitting: Urology

## 2016-05-23 DIAGNOSIS — R310 Gross hematuria: Secondary | ICD-10-CM

## 2016-06-09 ENCOUNTER — Ambulatory Visit (HOSPITAL_COMMUNITY)
Admission: RE | Admit: 2016-06-09 | Discharge: 2016-06-09 | Disposition: A | Discharge status: Home / Self Care | Payer: Medicare Other | Source: Ambulatory Visit | Attending: Urology | Admitting: Urology

## 2016-06-09 DIAGNOSIS — K5730 Diverticulosis of large intestine without perforation or abscess without bleeding: Secondary | ICD-10-CM | POA: Insufficient documentation

## 2016-06-09 DIAGNOSIS — M8448XA Pathological fracture, other site, initial encounter for fracture: Secondary | ICD-10-CM | POA: Diagnosis not present

## 2016-06-09 DIAGNOSIS — R911 Solitary pulmonary nodule: Secondary | ICD-10-CM | POA: Insufficient documentation

## 2016-06-09 DIAGNOSIS — I700 Atherosclerosis of aorta: Secondary | ICD-10-CM | POA: Insufficient documentation

## 2016-06-09 DIAGNOSIS — R310 Gross hematuria: Secondary | ICD-10-CM | POA: Insufficient documentation

## 2016-06-09 DIAGNOSIS — K862 Cyst of pancreas: Secondary | ICD-10-CM | POA: Insufficient documentation

## 2016-06-09 DIAGNOSIS — K449 Diaphragmatic hernia without obstruction or gangrene: Secondary | ICD-10-CM | POA: Diagnosis not present

## 2016-06-09 MED ORDER — IOPAMIDOL (ISOVUE-300) INJECTION 61%
125.0000 mL | Freq: Once | INTRAVENOUS | Status: AC | PRN
  Administered 2016-06-09: 125 mL via INTRAVENOUS

## 2016-06-09 MED ORDER — IOPAMIDOL (ISOVUE-300) INJECTION 61%: 100.0000 mL | Freq: Once | INTRAVENOUS | Status: DC | PRN

## 2016-06-12 LAB — POCT I-STAT CREATININE: CREATININE: 1 mg/dL (ref 0.44–1.00)

## 2016-07-02 ENCOUNTER — Other Ambulatory Visit: Payer: Self-pay | Admitting: Urology

## 2016-07-02 ENCOUNTER — Ambulatory Visit (INDEPENDENT_AMBULATORY_CARE_PROVIDER_SITE_OTHER): Payer: Medicare Other | Admitting: Urology

## 2016-07-02 DIAGNOSIS — R310 Gross hematuria: Secondary | ICD-10-CM

## 2016-07-03 ENCOUNTER — Other Ambulatory Visit: Payer: Self-pay | Admitting: Urology

## 2016-08-04 NOTE — Patient Instructions (Signed)
Jackie Phelps  08/04/2016     @PREFPERIOPPHARMACY @   Your procedure is scheduled on  08/11/2016   Report to [facility] at  71  A.M.  Call this number if you have problems the morning of surgery:  [phone]   Remember:  Do not eat food or drink liquids after midnight.  Take these medicines the morning of surgery with A SIP OF WATER  Amlodipine, coreg, valium, aricept.   Do not wear jewelry, make-up or nail polish.  Do not wear lotions, powders, or perfumes, or deoderant.  Do not shave 48 hours prior to surgery.  Men may shave face and neck.  Do not bring valuables to the hospital.  [facility] is not responsible for any belongings or valuables.  Contacts, dentures or bridgework may not be worn into surgery.  Leave your suitcase in the car.  After surgery it may be brought to your room.  For patients admitted to the hospital, discharge time will be determined by your treatment team.  Patients discharged the day of surgery will not be allowed to drive home.   Name and phone number of your driver:   family Special instructions:  None  Please read over the following fact sheets that you were given. Anesthesia Post-op Instructions and Care and Recovery After Surgery       Transurethral Resection of Bladder Tumor Transurethral resection of a bladder tumor is the removal (resection) of a cancerous growth (tumor) on the inside wall of the bladder. The bladder is the organ that holds urine. The tumor is removed through the tube that carries urine out of the body (urethra). In a transurethral resection, a thin telescope with a light, a tiny camera, and an electric cutting edge (resectoscope) is passed through the urethra. In men, the opening of the urethra is at the end of the penis. In women, it is just above the vaginal opening. Tell a health care provider about:  Any allergies you have.  All medicines you are taking, including vitamins, herbs, eye  drops, creams, and over-the-counter medicines.  Any problems you or family members have had with anesthetic medicines.  Any blood disorders you have.  Any surgeries you have had.  Any medical conditions you have.  Any recent urinary tract infections you have had.  Whether you are pregnant or may be pregnant. What are the risks? Generally, this is a safe procedure. However, problems may occur, including:  Infection.  Bleeding.  Allergic reactions to medicines.  Damage to other structures or organs, such as: ? The urethra. ? The tubes that drain urine from the kidneys into the bladder (ureters).  Pain and burning during urination.  Difficulty urinating due to partial blockage of the urethra.  Inability to urinate (urinary retention).  What happens before the procedure?  Follow instructions from your health care provider about eating and drinking restrictions.  Ask your health care provider about: ? Changing or stopping your regular medicines. This is especially important if you are taking diabetes medicines or blood thinners. ? Taking medicines such as aspirin and ibuprofen. These medicines can thin your blood. Do not take these medicines before your procedure if your health care provider instructs you not to.  You may have a physical exam.  You may have tests, including: ? Blood tests. ? Urine tests. ? Electrocardiogram (ECG). This test measures the electrical activity of the heart.  You may be  given antibiotic medicine to help prevent infection.  Ask your health care provider how your surgical site will be marked or identified.  Plan to have someone take you home after the procedure. What happens during the procedure?  To reduce your risk of infection: ? Your health care team will wash or sanitize their hands. ? Your skin will be washed with soap.  An IV tube will be inserted into one of your veins.  You will be given one or more of the following: ? A  medicine to help you relax (sedative). ? A medicine to make you fall asleep (general anesthetic). ? A medicine that is injected into your spine to numb the area below and slightly above the injection site (spinal anesthetic).  Your legs will be placed in foot rests (stirrups) so that your legs are apart and your knees are bent.  The resectoscope will be passed through your urethra and into your bladder.  The part of your bladder that is affected by the tumor will be resected using the cutting edge of the resectoscope.  The resectoscope will be removed.  A thin, flexible tube (catheter) will be passed through your urethra and into your bladder. The catheter will drain urine into a bag outside of your body. ? Fluid may be passed through the catheter to keep the catheter open. The procedure may vary among health care providers and hospitals. What happens after the procedure?  Your blood pressure, heart rate, breathing rate, and blood oxygen level will be monitored often until the medicines you were given have worn off.  You may continue to receive fluids and medicines through an IV tube.  You will have some pain. Pain medicine will be available to help you.  You will have a catheter draining your urine. ? You will have blood in your urine. Your catheter may be kept in until your urine is clear. ? Your urinary drainage will be monitored. If necessary, your bladder may be rinsed out (irrigated) by passing fluid through your catheter.  You will be encouraged to walk around as soon as possible.  You may have to wear compression stockings. These stockings help to prevent blood clots and reduce swelling in your legs.  Do not drive for 24 hours if you received a sedative. This information is not intended to replace advice given to you by your health care provider. Make sure you discuss any questions you have with your health care provider. Document Released: 10/19/2008 Document Revised:  08/26/2015 Document Reviewed: 09/14/2014 Elsevier Interactive Patient Education  2018 [place].  Transurethral Resection of Bladder Tumor, Care After Refer to this sheet in the next few weeks. These instructions provide you with information about caring for yourself after your procedure. Your health care provider may also give you more specific instructions. Your treatment has been planned according to current medical practices, but problems sometimes occur. Call your health care provider if you have any problems or questions after your procedure. What can I expect after the procedure? After the procedure, it is common to have:  A small amount of blood in your urine for up to 2 weeks.  Soreness or mild discomfort from your catheter. After your catheter is removed, you may have mild soreness, especially when urinating.  Pain in your lower abdomen.  Follow these instructions at home:  Medicines  Take over-the-counter and prescription medicines only as told by your health care provider.  Do not drive or operate heavy machinery while taking prescription pain  medicine.  Do not drive for 24 hours if you received a sedative.  If you were prescribed antibiotic medicine, take it as told by your health care provider. Do not stop taking the antibiotic even if you start to feel better. Activity  Return to your normal activities as told by your health care provider. Ask your health care provider what activities are safe for you.  Do not lift anything that is heavier than 10 lb (4.5 kg) for as long as told by your health care provider.  Avoid intense physical activity for as long as told by your health care provider.  Walk at least one time every day. This helps to prevent blood clots. You may increase your physical activity gradually as you start to feel better. General instructions  Do not drink alcohol for as long as told by your health care provider. This is especially important if you  are taking prescription pain medicines.  Do not take baths, swim, or use a hot tub until your health care provider approves.  If you have a catheter, follow instructions from your health care provider about caring for your catheter and your drainage bag.  Drink enough fluid to keep your urine clear or pale yellow.  Wear compression stockings as told by your health care provider. These stockings help to prevent blood clots and reduce swelling in your legs.  Keep all follow-up visits as told by your health care provider. This is important. Contact a health care provider if:  You have pain that gets worse or does not improve with medicine.  You have blood in your urine for more than 2 weeks.  You have cloudy or bad-smelling urine.  You become constipated. Signs of constipation may include having: ? Fewer than three bowel movements in a week. ? Difficulty having a bowel movement. ? Stools that are dry, hard, or larger than normal.  You have a fever. Get help right away if:  You have: ? Severe pain. ? Bright-red blood in your urine. ? Blood clots in your urine. ? A lot of blood in your urine.  Your catheter has been removed and you are not able to urinate.  You have a catheter in place and the catheter is not draining urine. This information is not intended to replace advice given to you by your health care provider. Make sure you discuss any questions you have with your health care provider. Document Released: 12/04/2014 Document Revised: 08/26/2015 Document Reviewed: 09/14/2014 Elsevier Interactive Patient Education  2018 [employer].  Cystoscopy Cystoscopy is a procedure that is used to help diagnose and sometimes treat conditions that affect that lower urinary tract. The lower urinary tract includes the bladder and the tube that drains urine from the bladder out of the body (urethra). Cystoscopy is performed with a thin, tube-shaped instrument with a light and camera at the  end (cystoscope). The cystoscope may be hard (rigid) or flexible, depending on the goal of the procedure.The cystoscope is inserted through the urethra, into the bladder. Cystoscopy may be recommended if you have:  Urinary tractinfections that keep coming back (recurring).  Blood in the urine (hematuria).  Loss of bladder control (urinary incontinence) or an overactive bladder.  Unusual cells found in a urine sample.  A blockage in the urethra.  Painful urination.  An abnormality in the bladder found during an intravenous pyelogram (IVP) or CT scan.  Cystoscopy may also be done to remove a sample of tissue to be examined under a microscope (  biopsy). Tell a health care provider about:  Any allergies you have.  All medicines you are taking, including vitamins, herbs, eye drops, creams, and over-the-counter medicines.  Any problems you or family members have had with anesthetic medicines.  Any blood disorders you have.  Any surgeries you have had.  Any medical conditions you have.  Whether you are pregnant or may be pregnant. What are the risks? Generally, this is a safe procedure. However, problems may occur, including:  Infection.  Bleeding.  Allergic reactions to medicines.  Damage to other structures or organs.  What happens before the procedure?  Ask your health care provider about: ? Changing or stopping your regular medicines. This is especially important if you are taking diabetes medicines or blood thinners. ? Taking medicines such as aspirin and ibuprofen. These medicines can thin your blood. Do not take these medicines before your procedure if your health care provider instructs you not to.  Follow instructions from your health care provider about eating or drinking restrictions.  You may be given antibiotic medicine to help prevent infection.  You may have an exam or testing, such as X-rays of the bladder, urethra, or kidneys.  You may have urine  tests to check for signs of infection.  Plan to have someone take you home after the procedure. What happens during the procedure?  To reduce your risk of infection,your health care team will wash or sanitize their hands.  You will be given one or more of the following: ? A medicine to help you relax (sedative). ? A medicine to numb the area (local anesthetic).  The area around the opening of your urethra will be cleaned.  The cystoscope will be passed through your urethra into your bladder.  Germ-free (sterile)fluid will flow through the cystoscope to fill your bladder. The fluid will stretch your bladder so that your surgeon can clearly examine your bladder walls.  The cystoscope will be removed and your bladder will be emptied. The procedure may vary among health care providers and hospitals. What happens after the procedure?  You may have some soreness or pain in your abdomen and urethra. Medicines will be available to help you.  You may have some blood in your urine.  Do not drive for 24 hours if you received a sedative. This information is not intended to replace advice given to you by your health care provider. Make sure you discuss any questions you have with your health care provider. Document Released: 12/21/1999 Document Revised: 05/03/2015 Document Reviewed: 11/09/2014 Elsevier Interactive Patient Education  2017 [place].  Cystoscopy, Care After Refer to this sheet in the next few weeks. These instructions provide you with information about caring for yourself after your procedure. Your health care provider may also give you more specific instructions. Your treatment has been planned according to current medical practices, but problems sometimes occur. Call your health care provider if you have any problems or questions after your procedure. What can I expect after the procedure? After the procedure, it is common to have:  Mild pain when you urinate. Pain should  stop within a few minutes after you urinate. This may last for up to 1 week.  A small amount of blood in your urine for several days.  Feeling like you need to urinate but producing only a small amount of urine.  Follow these instructions at home:  Medicines  Take over-the-counter and prescription medicines only as told by your health care provider.  If you were  prescribed an antibiotic medicine, take it as told by your health care provider. Do not stop taking the antibiotic even if you start to feel better. General instructions   Return to your normal activities as told by your health care provider. Ask your health care provider what activities are safe for you.  Do not drive for 24 hours if you received a sedative.  Watch for any blood in your urine. If the amount of blood in your urine increases, call your health care provider.  Follow instructions from your health care provider about eating or drinking restrictions.  If a tissue sample was removed for testing (biopsy) during your procedure, it is your responsibility to get your test results. Ask your health care provider or the department performing the test when your results will be ready.  Drink enough fluid to keep your urine clear or pale yellow.  Keep all follow-up visits as told by your health care provider. This is important. Contact a health care provider if:  You have pain that gets worse or does not get better with medicine, especially pain when you urinate.  You have difficulty urinating. Get help right away if:  You have more blood in your urine.  You have blood clots in your urine.  You have abdominal pain.  You have a fever or chills.  You are unable to urinate. This information is not intended to replace advice given to you by your health care provider. Make sure you discuss any questions you have with your health care provider. Document Released: 07/12/2004 Document Revised: 05/31/2015 Document  Reviewed: 11/09/2014 Elsevier Interactive Patient Education  2017 [place] Anesthesia, Adult General anesthesia is the use of medicines to make a person "go to sleep" (be unconscious) for a medical procedure. General anesthesia is often recommended when a procedure:  Is long.  Requires you to be still or in an unusual position.  Is major and can cause you to lose blood.  Is impossible to do without general anesthesia.  The medicines used for general anesthesia are called general anesthetics. In addition to making you sleep, the medicines:  Prevent pain.  Control your blood pressure.  Relax your muscles.  Tell a health care provider about:  Any allergies you have.  All medicines you are taking, including vitamins, herbs, eye drops, creams, and over-the-counter medicines.  Any problems you or family members have had with anesthetic medicines.  Types of anesthetics you have had in the past.  Any bleeding disorders you have.  Any surgeries you have had.  Any medical conditions you have.  Any history of heart or lung conditions, such as heart failure, sleep apnea, or chronic obstructive pulmonary disease (COPD).  Whether you are pregnant or may be pregnant.  Whether you use tobacco, alcohol, marijuana, or street drugs.  Any history of Armed forces logistics/support/administrative officer.  Any history of depression or anxiety. What are the risks? Generally, this is a safe procedure. However, problems may occur, including:  Allergic reaction to anesthetics.  Lung and heart problems.  Inhaling food or liquids from your stomach into your lungs (aspiration).  Injury to nerves.  Waking up during your procedure and being unable to move (rare).  Extreme agitation or a state of mental confusion (delirium) when you wake up from the anesthetic.  Air in the bloodstream, which can lead to stroke.  These problems are more likely to develop if you are having a major surgery or if you have an  advanced medical condition.  You can prevent some of these complications by answering all of your health care provider's questions thoroughly and by following all pre-procedure instructions. General anesthesia can cause side effects, including:  Nausea or vomiting  A sore throat from the breathing tube.  Feeling cold or shivery.  Feeling tired, washed out, or achy.  Sleepiness or drowsiness.  Confusion or agitation.  What happens before the procedure? Staying hydrated Follow instructions from your health care provider about hydration, which may include:  Up to 2 hours before the procedure - you may continue to drink clear liquids, such as water, clear fruit juice, black coffee, and plain tea.  Eating and drinking restrictions Follow instructions from your health care provider about eating and drinking, which may include:  8 hours before the procedure - stop eating heavy meals or foods such as meat, fried foods, or fatty foods.  6 hours before the procedure - stop eating light meals or foods, such as toast or cereal.  6 hours before the procedure - stop drinking milk or drinks that contain milk.  2 hours before the procedure - stop drinking clear liquids.  Medicines  Ask your health care provider about: ? Changing or stopping your regular medicines. This is especially important if you are taking diabetes medicines or blood thinners. ? Taking medicines such as aspirin and ibuprofen. These medicines can thin your blood. Do not take these medicines before your procedure if your health care provider instructs you not to. ? Taking new dietary supplements or medicines. Do not take these during the week before your procedure unless your health care provider approves them.  If you are told to take a medicine or to continue taking a medicine on the day of the procedure, take the medicine with sips of water. General instructions   Ask if you will be going home the same day, the  following day, or after a longer hospital stay. ? Plan to have someone take you home. ? Plan to have someone stay with you for the first 24 hours after you leave the hospital or clinic.  For 3-6 weeks before the procedure, try not to use any tobacco products, such as cigarettes, chewing tobacco, and e-cigarettes.  You may brush your teeth on the morning of the procedure, but make sure to spit out the toothpaste. What happens during the procedure?  You will be given anesthetics through a mask and through an IV tube in one of your veins.  You may receive medicine to help you relax (sedative).  As soon as you are asleep, a breathing tube may be used to help you breathe.  An anesthesia specialist will stay with you throughout the procedure. He or she will help keep you comfortable and safe by continuing to give you medicines and adjusting the amount of medicine that you get. He or she will also watch your blood pressure, pulse, and oxygen levels to make sure that the anesthetics do not cause any problems.  If a breathing tube was used to help you breathe, it will be removed before you wake up. The procedure may vary among health care providers and hospitals. What happens after the procedure?  You will wake up, often slowly, after the procedure is complete, usually in a recovery area.  Your blood pressure, heart rate, breathing rate, and blood oxygen level will be monitored until the medicines you were given have worn off.  You may be given medicine to help you calm down if you feel anxious or  agitated.  If you will be going home the same day, your health care provider may check to make sure you can stand, drink, and urinate.  Your health care providers will treat your pain and side effects before you go home.  Do not drive for 24 hours if you received a sedative.  You may: ? Feel nauseous and vomit. ? Have a sore throat. ? Have mental slowness. ? Feel cold or shivery. ? Feel  sleepy. ? Feel tired. ? Feel sore or achy, even in parts of your body where you did not have surgery. This information is not intended to replace advice given to you by your health care provider. Make sure you discuss any questions you have with your health care provider. Document Released: 04/01/2007 Document Revised: 06/05/2015 Document Reviewed: 12/07/2014 Elsevier Interactive Patient Education  2018 [place] Anesthesia, Adult, Care After These instructions provide you with information about caring for yourself after your procedure. Your health care provider may also give you more specific instructions. Your treatment has been planned according to current medical practices, but problems sometimes occur. Call your health care provider if you have any problems or questions after your procedure. What can I expect after the procedure? After the procedure, it is common to have:  Vomiting.  A sore throat.  Mental slowness.  It is common to feel:  Nauseous.  Cold or shivery.  Sleepy.  Tired.  Sore or achy, even in parts of your body where you did not have surgery.  Follow these instructions at home: For at least 24 hours after the procedure:  Do not: ? Participate in activities where you could fall or become injured. ? Drive. ? Use heavy machinery. ? Drink alcohol. ? Take sleeping pills or medicines that cause drowsiness. ? Make important decisions or sign legal documents. ? Take care of children on your own.  Rest. Eating and drinking  If you vomit, drink water, juice, or soup when you can drink without vomiting.  Drink enough fluid to keep your urine clear or pale yellow.  Make sure you have little or no nausea before eating solid foods.  Follow the diet recommended by your health care provider. General instructions  Have a responsible adult stay with you until you are awake and alert.  Return to your normal activities as told by your health care  provider. Ask your health care provider what activities are safe for you.  Take over-the-counter and prescription medicines only as told by your health care provider.  If you smoke, do not smoke without supervision.  Keep all follow-up visits as told by your health care provider. This is important. Contact a health care provider if:  You continue to have nausea or vomiting at home, and medicines are not helpful.  You cannot drink fluids or start eating again.  You cannot urinate after 8-12 hours.  You develop a skin rash.  You have fever.  You have increasing redness at the site of your procedure. Get help right away if:  You have difficulty breathing.  You have chest pain.  You have unexpected bleeding.  You feel that you are having a life-threatening or urgent problem. This information is not intended to replace advice given to you by your health care provider. Make sure you discuss any questions you have with your health care provider. Document Released: 03/31/2000 Document Revised: 05/28/2015 Document Reviewed: 12/07/2014 Elsevier Interactive Patient Education  [employer].

## 2016-08-06 ENCOUNTER — Encounter (HOSPITAL_COMMUNITY): Payer: Self-pay

## 2016-08-06 ENCOUNTER — Encounter (HOSPITAL_COMMUNITY)
Admission: RE | Admit: 2016-08-06 | Discharge: 2016-08-06 | Disposition: A | Discharge status: Home / Self Care | Payer: Medicare Other | Source: Ambulatory Visit | Attending: Urology | Admitting: Urology

## 2016-08-06 ENCOUNTER — Other Ambulatory Visit: Payer: Self-pay

## 2016-08-06 DIAGNOSIS — Z01810 Encounter for preprocedural cardiovascular examination: Secondary | ICD-10-CM | POA: Diagnosis present

## 2016-08-06 DIAGNOSIS — Z01812 Encounter for preprocedural laboratory examination: Secondary | ICD-10-CM | POA: Insufficient documentation

## 2016-08-06 HISTORY — DX: Other complications of anesthesia, initial encounter: T88.59XA

## 2016-08-06 HISTORY — DX: Other amnesia: R41.3

## 2016-08-06 HISTORY — DX: Adverse effect of unspecified anesthetic, initial encounter: T41.45XA

## 2016-08-06 HISTORY — DX: Sleep apnea, unspecified: G47.30

## 2016-08-06 LAB — CBC WITH DIFFERENTIAL/PLATELET
BASOS PCT: 0 %
Basophils Absolute: 0 10*3/uL (ref 0.0–0.1)
Eosinophils Absolute: 0.1 10*3/uL (ref 0.0–0.7)
Eosinophils Relative: 1 %
HEMATOCRIT: 38.6 % (ref 36.0–46.0)
HEMOGLOBIN: 12.9 g/dL (ref 12.0–15.0)
LYMPHS ABS: 1.4 10*3/uL (ref 0.7–4.0)
Lymphocytes Relative: 21 %
MCH: 29.9 pg (ref 26.0–34.0)
MCHC: 33.4 g/dL (ref 30.0–36.0)
MCV: 89.4 fL (ref 78.0–100.0)
MONOS PCT: 12 %
Monocytes Absolute: 0.8 10*3/uL (ref 0.1–1.0)
NEUTROS PCT: 66 %
Neutro Abs: 4.4 10*3/uL (ref 1.7–7.7)
Platelets: 187 10*3/uL (ref 150–400)
RBC: 4.32 MIL/uL (ref 3.87–5.11)
RDW: 12.6 % (ref 11.5–15.5)
WBC: 6.7 10*3/uL (ref 4.0–10.5)

## 2016-08-06 LAB — BASIC METABOLIC PANEL
Anion gap: 9 (ref 5–15)
BUN: 18 mg/dL (ref 6–20)
CHLORIDE: 110 mmol/L (ref 101–111)
CO2: 22 mmol/L (ref 22–32)
CREATININE: 0.93 mg/dL (ref 0.44–1.00)
Calcium: 9.5 mg/dL (ref 8.9–10.3)
GFR calc non Af Amer: 55 mL/min — ABNORMAL LOW (ref >60)
GLUCOSE: 111 mg/dL — AB (ref 65–99)
Potassium: 4.3 mmol/L (ref 3.5–5.1)
Sodium: 141 mmol/L (ref 135–145)

## 2016-08-11 ENCOUNTER — Ambulatory Visit (HOSPITAL_COMMUNITY)
Admission: RE | Admit: 2016-08-11 | Discharge: 2016-08-11 | Disposition: A | Discharge status: Home / Self Care | Payer: Medicare Other | Source: Ambulatory Visit | Attending: Urology | Admitting: Urology

## 2016-08-11 ENCOUNTER — Encounter (HOSPITAL_COMMUNITY): Payer: Self-pay | Admitting: Anesthesiology

## 2016-08-11 ENCOUNTER — Ambulatory Visit (HOSPITAL_COMMUNITY): Payer: Medicare Other | Admitting: Anesthesiology

## 2016-08-11 ENCOUNTER — Ambulatory Visit (HOSPITAL_COMMUNITY): Payer: Medicare Other

## 2016-08-11 ENCOUNTER — Encounter (HOSPITAL_COMMUNITY)
Admission: RE | Disposition: A | Discharge status: Home / Self Care | Payer: Self-pay | Source: Ambulatory Visit | Attending: Urology

## 2016-08-11 DIAGNOSIS — F419 Anxiety disorder, unspecified: Secondary | ICD-10-CM | POA: Diagnosis not present

## 2016-08-11 DIAGNOSIS — I10 Essential (primary) hypertension: Secondary | ICD-10-CM | POA: Insufficient documentation

## 2016-08-11 DIAGNOSIS — Z87891 Personal history of nicotine dependence: Secondary | ICD-10-CM | POA: Insufficient documentation

## 2016-08-11 DIAGNOSIS — C675 Malignant neoplasm of bladder neck: Secondary | ICD-10-CM | POA: Diagnosis not present

## 2016-08-11 DIAGNOSIS — Z79899 Other long term (current) drug therapy: Secondary | ICD-10-CM | POA: Insufficient documentation

## 2016-08-11 DIAGNOSIS — D494 Neoplasm of unspecified behavior of bladder: Secondary | ICD-10-CM

## 2016-08-11 DIAGNOSIS — E7800 Pure hypercholesterolemia, unspecified: Secondary | ICD-10-CM | POA: Diagnosis not present

## 2016-08-11 DIAGNOSIS — G4730 Sleep apnea, unspecified: Secondary | ICD-10-CM | POA: Insufficient documentation

## 2016-08-11 DIAGNOSIS — R3121 Asymptomatic microscopic hematuria: Secondary | ICD-10-CM | POA: Diagnosis not present

## 2016-08-11 DIAGNOSIS — Z880 Allergy status to penicillin: Secondary | ICD-10-CM | POA: Diagnosis not present

## 2016-08-11 HISTORY — PX: CYSTOSCOPY W/ RETROGRADES: SHX1426

## 2016-08-11 HISTORY — PX: TRANSURETHRAL RESECTION OF BLADDER TUMOR: SHX2575

## 2016-08-11 SURGERY — TURBT (TRANSURETHRAL RESECTION OF BLADDER TUMOR)
Anesthesia: General

## 2016-08-11 MED ORDER — DEXAMETHASONE SODIUM PHOSPHATE 10 MG/ML IJ SOLN
INTRAMUSCULAR | Status: DC | PRN
  Administered 2016-08-11: 10 mg via INTRAVENOUS

## 2016-08-11 MED ORDER — TRAMADOL HCL 50 MG PO TABS: 50.0000 mg | ORAL_TABLET | Freq: Four times a day (QID) | ORAL | 0 refills | Status: AC | PRN

## 2016-08-11 MED ORDER — MIDAZOLAM HCL 2 MG/2ML IJ SOLN
INTRAMUSCULAR | Status: AC
  Filled 2016-08-11: qty 2

## 2016-08-11 MED ORDER — PROPOFOL 10 MG/ML IV BOLUS
INTRAVENOUS | Status: DC | PRN
  Administered 2016-08-11: 80 mg via INTRAVENOUS

## 2016-08-11 MED ORDER — SODIUM CHLORIDE 0.9 % IR SOLN
Status: DC | PRN
  Administered 2016-08-11: 3000 mL via INTRAVESICAL

## 2016-08-11 MED ORDER — LIDOCAINE HCL (CARDIAC) 20 MG/ML IV SOLN
INTRAVENOUS | Status: DC | PRN
  Administered 2016-08-11: 40 mg via INTRAVENOUS

## 2016-08-11 MED ORDER — CIPROFLOXACIN IN D5W 400 MG/200ML IV SOLN
INTRAVENOUS | Status: DC | PRN
  Administered 2016-08-11: 400 mg via INTRAVENOUS

## 2016-08-11 MED ORDER — CIPROFLOXACIN IN D5W 400 MG/200ML IV SOLN
INTRAVENOUS | Status: AC
  Filled 2016-08-11: qty 200

## 2016-08-11 MED ORDER — MIDAZOLAM HCL 2 MG/2ML IJ SOLN
1.0000 mg | INTRAMUSCULAR | Status: AC
  Administered 2016-08-11 (×2): 2 mg via INTRAVENOUS
  Filled 2016-08-11: qty 2

## 2016-08-11 MED ORDER — STERILE WATER FOR IRRIGATION IR SOLN
Status: DC | PRN
  Administered 2016-08-11: 1000 mL

## 2016-08-11 MED ORDER — DIATRIZOATE MEGLUMINE 30 % UR SOLN
URETHRAL | Status: DC | PRN
  Administered 2016-08-11: 10 mL

## 2016-08-11 MED ORDER — PROPOFOL 10 MG/ML IV BOLUS
INTRAVENOUS | Status: AC
  Filled 2016-08-11: qty 20

## 2016-08-11 MED ORDER — ONDANSETRON HCL 4 MG/2ML IJ SOLN
INTRAMUSCULAR | Status: DC | PRN
  Administered 2016-08-11: 4 mg via INTRAVENOUS

## 2016-08-11 MED ORDER — FENTANYL CITRATE (PF) 100 MCG/2ML IJ SOLN: 25.0000 ug | INTRAMUSCULAR | Status: DC | PRN

## 2016-08-11 MED ORDER — FENTANYL CITRATE (PF) 100 MCG/2ML IJ SOLN
INTRAMUSCULAR | Status: DC | PRN
  Administered 2016-08-11: 25 ug via INTRAVENOUS

## 2016-08-11 MED ORDER — FENTANYL CITRATE (PF) 100 MCG/2ML IJ SOLN
INTRAMUSCULAR | Status: AC
  Filled 2016-08-11: qty 2

## 2016-08-11 MED ORDER — LACTATED RINGERS IV SOLN
INTRAVENOUS | Status: DC
  Administered 2016-08-11: 1000 mL via INTRAVENOUS

## 2016-08-11 MED ORDER — DIATRIZOATE MEGLUMINE 30 % UR SOLN
URETHRAL | Status: AC
  Filled 2016-08-11: qty 300

## 2016-08-11 SURGICAL SUPPLY — 24 items
BAG DRAIN URO TABLE W/ADPT NS (DRAPE) ×4 IMPLANT
BAG HAMPER (MISCELLANEOUS) ×4 IMPLANT
CATH INTERMIT  6FR 70CM (CATHETERS) ×4 IMPLANT
CLOTH BEACON ORANGE TIMEOUT ST (SAFETY) ×4 IMPLANT
ELECT LOOP 22F BIPOLAR SML (ELECTROSURGICAL) ×4
ELECT REM PT RETURN 9FT ADLT (ELECTROSURGICAL) ×4
ELECTRODE LOOP 22F BIPOLAR SML (ELECTROSURGICAL) ×2 IMPLANT
ELECTRODE REM PT RTRN 9FT ADLT (ELECTROSURGICAL) ×2 IMPLANT
GLOVE BIO SURGEON STRL SZ8 (GLOVE) ×4 IMPLANT
GLOVE BIOGEL PI IND STRL 6.5 (GLOVE) ×2 IMPLANT
GLOVE BIOGEL PI INDICATOR 6.5 (GLOVE) ×2
GLOVE ECLIPSE 6.5 STRL STRAW (GLOVE) ×4 IMPLANT
GOWN STRL REUS W/ TWL LRG LVL3 (GOWN DISPOSABLE) ×2 IMPLANT
GOWN STRL REUS W/ TWL XL LVL3 (GOWN DISPOSABLE) ×2 IMPLANT
GOWN STRL REUS W/TWL LRG LVL3 (GOWN DISPOSABLE) ×2
GOWN STRL REUS W/TWL XL LVL3 (GOWN DISPOSABLE) ×2
GUIDEWIRE STR ZIPWIRE 035X150 (MISCELLANEOUS) ×4 IMPLANT
IV NS IRRIG 3000ML ARTHROMATIC (IV SOLUTION) ×4 IMPLANT
KIT ROOM TURNOVER AP CYSTO (KITS) ×4 IMPLANT
MANIFOLD NEPTUNE II (INSTRUMENTS) ×4 IMPLANT
PACK CYSTO (CUSTOM PROCEDURE TRAY) ×4 IMPLANT
PAD ARMBOARD 7.5X6 YLW CONV (MISCELLANEOUS) ×4 IMPLANT
TRAY FOLEY CATH SILVER 16FR (SET/KITS/TRAYS/PACK) ×4 IMPLANT
WATER STERILE IRR 500ML POUR (IV SOLUTION) ×4 IMPLANT

## 2016-08-11 NOTE — Anesthesia Postprocedure Evaluation (Signed)
Anesthesia Post Note  Patient: [name]  Procedure(s) Performed: Procedure(s) (LRB): TRANSURETHRAL RESECTION OF BLADDER TUMOR (TURBT) (N/A) CYSTOSCOPY WITH BILATERAL RETROGRADE PYELOGRAM (Bilateral)  Patient location during evaluation: PACU Anesthesia Type: General Level of consciousness: awake and alert, oriented and patient cooperative Pain management: pain level controlled Vital Signs Assessment: post-procedure vital signs reviewed and stable Respiratory status: spontaneous breathing Cardiovascular status: blood pressure returned to baseline and stable Postop Assessment: no headache, adequate PO intake and no signs of nausea or vomiting Anesthetic complications: no     Last Vitals:  Vitals:   08/11/16 1100 08/11/16 1315  BP: (!) 144/74 (!) 156/79  Pulse:  70  Resp: (!) 24 16  Temp:  (!) 36.4 C    Last Pain:  Vitals:   08/11/16 1315  TempSrc: Oral                 Jackie Phelps

## 2016-08-11 NOTE — H&P (Signed)
Urology Admission H&P  Chief Complaint: Bladder tumor  History of Present Illness: Jackie Phelps is a 83yo with a hx of bladder tumor found on office cystoscopy  Past Medical History:  Diagnosis Date  . Anxiety   . Complication of anesthesia    Has increased BP after surgery.  . Constipation   . Hypercholesteremia   . Hypertension   . Impaired memory    due to severe sleep apnea,on CPAP now;stopped breathing 53.9 times per hour previously on study.  . Sleep apnea    Past Surgical History:  Procedure Laterality Date  . CATARACT EXTRACTION, BILATERAL    . COLONOSCOPY    . COLONOSCOPY  03/05/2011   Procedure: COLONOSCOPY;  Surgeon: Rogene Houston, MD;  Location: [facility];  Service: Endoscopy;  Laterality: N/A;  1200  . HIP ARTHROPLASTY Left 01/08/2014   Procedure: ARTHROPLASTY BIPOLAR HIP;  Surgeon: Marianna Payment, MD;  Location: [place];  Service: Orthopedics;  Laterality: Left;  . TRIGGER FINGER RELEASE Bilateral    ring and middle fingers.    Home Medications:  Prescriptions Prior to Admission  Medication Sig Dispense Refill Last Dose  . amLODipine (NORVASC) 5 MG tablet Take 1 tablet by mouth daily.  2 08/10/2016 at Unknown time  . carvedilol (COREG) 6.25 MG tablet Take 12.5 mg by mouth 2 (two) times daily with a meal.    08/10/2016 at 0800  . donepezil (ARICEPT) 10 MG tablet Take 10 mg by mouth daily.  2 08/10/2016 at Unknown time  . escitalopram (LEXAPRO) 5 MG tablet Take 5 mg by mouth at bedtime.   0 08/10/2016 at Unknown time  . memantine (NAMENDA) 10 MG tablet Take 10 mg by mouth at bedtime.  0 08/10/2016 at Unknown time  . multivitamin-lutein (OCUVITE-LUTEIN) CAPS capsule Take 1 capsule by mouth daily.  12 08/10/2016 at Unknown time  . SB LOW DOSE ASA EC 81 MG EC tablet Take 81 mg by mouth daily.  12 08/10/2016 at Unknown time  . simvastatin (ZOCOR) 20 MG tablet Take 20 mg by mouth daily.   08/10/2016 at Unknown time  . diazepam (VALIUM) 5 MG tablet Take one tablet by mouth twice  daily as needed for anxiety 60 tablet 0 More than a month at Unknown time   Allergies:  Allergies  Allergen Reactions  . Penicillins Rash and Hives    Has patient had a PCN reaction causing immediate rash, facial/tongue/throat swelling, SOB or lightheadedness with hypotension: unknown Has patient had a PCN reaction causing severe rash involving mucus membranes or skin necrosis: unknown Has patient had a PCN reaction that required hospitalization: unknown Has patient had a PCN reaction occurring within the last 10 years: unknown If all of the above answers are "NO", then may proceed with Cephalosporin use. Has patient had a PCN reaction causing immediate rash, facial/tongue/throat swelling, SOB or lightheadedness with hypotension: unknown Has patient had a PCN reaction causing severe rash involving mucus membranes or skin necrosis: unknown Has patient had a PCN reaction that required hospitalization: unknown Has patient had a PCN reaction occurring within the last 10 years: unknown If all of the above answers are "NO", then may proceed with Cephalosporin use.   Marland Kitchen Hydrochlorothiazide Other (See Comments)    Dehydration    Family History  Problem Relation Age of Onset  . Hypertension Mother   . Healthy Daughter   . Colon cancer Neg Hx    Social History:  reports that she quit smoking about 50 years  ago. Her smoking use included Cigarettes. She has a 1.00 pack-year smoking history. She has never used smokeless tobacco. She reports that she drinks about 1.2 oz of alcohol per week . She reports that she does not use drugs.  Review of Systems  All other systems reviewed and are negative.   Physical Exam:  Vital signs in last 24 hours: Temp:  [97.7 F (36.5 C)] 97.7 F (36.5 C) (08/06 0930) Pulse Rate:  [63] 63 (08/06 0930) Resp:  [18-28] 24 (08/06 1100) BP: (133-159)/(64-75) 144/74 (08/06 1100) SpO2:  [93 %-99 %] 99 % (08/06 1100) Physical Exam  Constitutional: She is oriented to  person, place, and time. She appears well-developed and well-nourished.  HENT:  Head: Normocephalic and atraumatic.  Eyes: Pupils are equal, round, and reactive to light. EOM are normal.  Neck: Normal range of motion. No thyromegaly present.  Cardiovascular: Normal rate and regular rhythm.   Respiratory: Effort normal. No respiratory distress.  GI: Soft. She exhibits no distension.  Musculoskeletal: Normal range of motion. She exhibits no edema.  Neurological: She is alert and oriented to person, place, and time.  Skin: Skin is warm and dry.  Psychiatric: She has a normal mood and affect. Her behavior is normal. Judgment and thought content normal.    Laboratory Data:  No results found for this or any previous visit (from the past 24 hour(s)). No results found for this or any previous visit (from the past 240 hour(s)). Creatinine:  Recent Labs  08/06/16 1345  CREATININE 0.93   Baseline Creatinine: 0.9  Impression/Assessment:  83yo with a bladder tumor  Plan:  The risks/benefits/alternatives to TURBT was explained to the patient and she understands and wishes to proceed with surgery  [name] 08/11/2016, 11:39 AM

## 2016-08-11 NOTE — Op Note (Signed)
.  Preoperative diagnosis: bladder tumor  Postoperative diagnosis: Same  Procedure: 1 cystoscopy 2. bilateral retrograde pyelography 3.  Intraoperative fluoroscopy, under one hour, with interpretation 4.  Transurethral resection of bladder tumor, small  Attending: Rosie Fate  Anesthesia: General  Estimated blood loss: Minimal  Drains: 22 French foley  Specimens: bladder tumor  Antibiotics: cipro  Findings: 0.5cm papillary bladder neck tumor.  Ureteral orifices in normal anatomic location. No hydronephrosis or filling defects in either collecting system  Indications: Patient is a 83 year old female with a history of bladder tumor and mircohematuria.  After discussing treatment options, they decided proceed with transurethral resection of a bladder tumor.  Procedure her in detail: The patient was brought to the operating room and a brief timeout was done to ensure correct patient, correct procedure, correct site.  General anesthesia was administered patient was placed in dorsal lithotomy position.  Their genitalia was then prepped and draped in usual sterile fashion.  A rigid 50 French cystoscope was passed in the urethra and the bladder.  Bladder was inspected and we noted a 0.5cm bladder neck tumor.  the ureteral orifices were in the normal orthotopic locations.  a 6 french ureteral catheter was then instilled into the left ureteral orifice.  a gentle retrograde was obtained and findings noted above. We then turned our attention to the right side. a 6 french ureteral catheter was then instilled into the right ureteral orifice.  a gentle retrograde was obtained and findings noted above. We then removed the cystoscope and placed a resectoscope into the bladder. Using the bipolar resectoscope we removed the bladder tumor down to the base. A subsequent muscle deep biopsy was then taken. Hemostasis was then obtained with electrocautery. We then removed the bladder tumor chips and sent them  for pathology. We then re-inspected the bladder and found no residula bleeding.  the bladder was then drained, a 22 French foley was placed and this concluded the procedure which was well tolerated by patient.  Complications: None  Condition: Stable, extubated, transferred to PACU  Plan: Patient is to be discharged home and followup in 5 days for foley catheter removal and pathology discussion.

## 2016-08-11 NOTE — Discharge Instructions (Signed)
Indwelling Urinary Catheter Care, Adult  Take good care of your catheter to keep it working and to prevent problems.  How to wear your catheter  Attach your catheter to your leg with tape (adhesive tape) or a leg strap. Make sure it is not too tight. If you use tape, remove any bits of tape that are already on the catheter.  How to wear a drainage bag  You should have:   A large overnight bag.   A small leg bag.    Overnight Bag  You may wear the overnight bag at any time. Always keep the bag below the level of your bladder but off the floor. When you sleep, put a clean plastic bag in a wastebasket. Then hang the bag inside the wastebasket.  Leg Bag  Never wear the leg bag at night. Always wear the leg bag below your knee. Keep the leg bag secure with a leg strap or tape.  How to care for your skin   Clean the skin around the catheter at least once every day.   Shower every day. Do not take baths.   Put creams, lotions, or ointments on your genital area only as told by your doctor.   Do not use powders, sprays, or lotions on your genital area.  How to clean your catheter and your skin  1. Wash your hands with soap and water.  2. Wet a washcloth in warm water and gentle (mild) soap.  3. Use the washcloth to clean the skin where the catheter enters your body. Clean downward and wipe away from the catheter in small circles. Do not wipe toward the catheter.  4. Pat the area dry with a clean towel. Make sure to clean off all soap.  How to care for your drainage bags  Empty your drainage bag when it is ?- full or at least 2-3 times a day. Replace your drainage bag once a month or sooner if it starts to smell bad or look dirty. Do not clean your drainage bag unless told by your doctor.  Emptying a drainage bag    Supplies Needed   Rubbing alcohol.   Gauze pad or cotton ball.   Tape or a leg strap.    Steps  1. Wash your hands with soap and water.  2. Separate (detach) the bag from your leg.  3. Hold the bag over  the toilet or a clean container. Keep the bag below your hips and bladder. This stops pee (urine) from going back into the tube.  4. Open the pour spout at the bottom of the bag.  5. Empty the pee into the toilet or container. Do not let the pour spout touch any surface.  6. Put rubbing alcohol on a gauze pad or cotton ball.  7. Use the gauze pad or cotton ball to clean the pour spout.  8. Close the pour spout.  9. Attach the bag to your leg with tape or a leg strap.  10. Wash your hands.    Changing a drainage bag  Supplies Needed   Alcohol wipes.   A clean drainage bag.   Adhesive tape or a leg strap.    Steps  1. Wash your hands with soap and water.  2. Separate the dirty bag from your leg.  3. Pinch the rubber catheter with your fingers so that pee does not spill out.  4. Separate the catheter tube from the drainage tube where these tubes connect (at the   connection valve). Do not let the tubes touch any surface.  5. Clean the end of the catheter tube with an alcohol wipe. Use a different alcohol wipe to clean the end of the drainage tube.  6. Connect the catheter tube to the drainage tube of the clean bag.  7. Attach the new bag to the leg with adhesive tape or a leg strap.  8. Wash your hands.    How to prevent infection and other problems   Never pull on your catheter or try to remove it. Pulling can damage tissue in your body.   Always wash your hands before and after touching your catheter.   If a leg strap gets wet, replace it with a dry one.   Drink enough fluids to keep your pee clear or pale yellow, or as told by your doctor.   Do not let the drainage bag or tubing touch the floor.   Wear cotton underwear.   If you are female, wipe from front to back after you poop (have a bowel movement).   Check on the catheter often to make sure it works and the tubing is not twisted.  Get help if:   Your pee is cloudy.   Your pee smells unusually bad.   Your pee is not draining into the bag.   Your  tube gets clogged.   Your catheter starts to leak.   Your bladder feels full.  Get help right away if:   You have redness, swelling, or pain where the catheter enters your body.   You have fluid, pus, or a bad smell coming from the area where the catheter enters your body.   The area where the catheter enters your body feels warm.   You have a fever.   You have pain in your:  ? Stomach (abdomen).  ? Legs.  ? Lower back.  ? Bladder.   You see blood fill the catheter.   Your pee is pink or red.   You feel sick to your stomach (nauseous).   You throw up (vomit).   You have chills.   Your catheter gets pulled out.  This information is not intended to replace advice given to you by your health care provider. Make sure you discuss any questions you have with your health care provider.  Document Released: 04/19/2012 Document Revised: 11/21/2015 Document Reviewed: 06/07/2013  [employer] Interactive Patient Education  2018 [employer]

## 2016-08-11 NOTE — Addendum Note (Signed)
Addendum  created 08/11/16 1413 by Ollen Bowl, CRNA   Sign clinical note

## 2016-08-11 NOTE — Anesthesia Preprocedure Evaluation (Signed)
Anesthesia Evaluation  Patient identified by MRN, date of birth, ID band Patient awake    Reviewed: Allergy & Precautions, NPO status , Patient's Chart, lab work & pertinent test results  Airway Mallampati: II  TM Distance: >3 FB Neck ROM: Full    Dental  (+) Teeth Intact   Pulmonary sleep apnea and Continuous Positive Airway Pressure Ventilation , former smoker,    breath sounds clear to auscultation       Cardiovascular hypertension, Pt. on medications  Rhythm:Regular Rate:Normal     Neuro/Psych PSYCHIATRIC DISORDERS Anxiety    GI/Hepatic negative GI ROS, Neg liver ROS,   Endo/Other  negative endocrine ROS  Renal/GU negative Renal ROS     Musculoskeletal   Abdominal   Peds  Hematology negative hematology ROS (+)   Anesthesia Other Findings   Reproductive/Obstetrics                             Anesthesia Physical Anesthesia Plan  ASA: II  Anesthesia Plan: General   Post-op Pain Management:    Induction: Intravenous  PONV Risk Score and Plan:   Airway Management Planned: LMA  Additional Equipment:   Intra-op Plan:   Post-operative Plan: Extubation in OR  Informed Consent: I have reviewed the patients History and Physical, chart, labs and discussed the procedure including the risks, benefits and alternatives for the proposed anesthesia with the patient or authorized representative who has indicated his/her understanding and acceptance.     Plan Discussed with:   Anesthesia Plan Comments:         Anesthesia Quick Evaluation

## 2016-08-11 NOTE — Anesthesia Procedure Notes (Signed)
Procedure Name: LMA Insertion Performed by: Lerry Liner Pre-anesthesia Checklist: Patient identified, Patient being monitored, Emergency Drugs available, Timeout performed and Suction available Patient Re-evaluated:Patient Re-evaluated prior to induction Oxygen Delivery Method: Circle System Utilized Preoxygenation: Pre-oxygenation with 100% oxygen Induction Type: IV induction Ventilation: Mask ventilation without difficulty LMA: LMA inserted LMA Size: 4.0 Number of attempts: 1 Placement Confirmation: positive ETCO2 and breath sounds checked- equal and bilateral

## 2016-08-11 NOTE — Transfer of Care (Signed)
Immediate Anesthesia Transfer of Care Note  Patient: Jackie Phelps  Procedure(s) Performed: Procedure(s): TRANSURETHRAL RESECTION OF BLADDER TUMOR (TURBT) (N/A) CYSTOSCOPY WITH BILATERAL RETROGRADE PYELOGRAM (Bilateral)  Patient Location: PACU  Anesthesia Type:General  Level of Consciousness: awake, alert , oriented and patient cooperative  Airway & Oxygen Therapy: Patient Spontanous Breathing and Patient connected to nasal cannula oxygen  Post-op Assessment: Report given to RN and Post -op Vital signs reviewed and stable  Post vital signs: Reviewed and stable  Last Vitals:  Vitals:   08/11/16 1100 08/11/16 1315  BP: (!) 144/74 (!) 156/79  Pulse:  70  Resp: (!) 24 16  Temp:  (!) 36.4 C    Last Pain:  Vitals:   08/11/16 1315  TempSrc: Oral      Patients Stated Pain Goal: 0 (15/04/13 6438)  Complications: No apparent anesthesia complications

## 2016-08-11 NOTE — Anesthesia Postprocedure Evaluation (Signed)
Anesthesia Post Note  Patient: Jackie Phelps  Procedure(s) Performed: Procedure(s) (LRB): TRANSURETHRAL RESECTION OF BLADDER TUMOR (TURBT) (N/A) CYSTOSCOPY WITH BILATERAL RETROGRADE PYELOGRAM (Bilateral)  Patient location during evaluation: PACU Anesthesia Type: General Level of consciousness: awake and alert and oriented Pain management: pain level controlled Vital Signs Assessment: post-procedure vital signs reviewed and stable Respiratory status: spontaneous breathing Cardiovascular status: blood pressure returned to baseline and stable Postop Assessment: no signs of nausea or vomiting Anesthetic complications: no     Last Vitals:  Vitals:   08/11/16 1345 08/11/16 1400  BP: 139/88 (!) 154/65  Pulse: (!) 59 63  Resp: 19 16  Temp:      Last Pain:  Vitals:   08/11/16 1315  TempSrc: Oral                 Lamira Borin

## 2016-08-12 ENCOUNTER — Encounter (HOSPITAL_COMMUNITY): Payer: Self-pay | Admitting: Urology

## 2016-08-14 ENCOUNTER — Encounter (HOSPITAL_COMMUNITY): Payer: Self-pay | Admitting: Emergency Medicine

## 2016-08-14 ENCOUNTER — Emergency Department (HOSPITAL_COMMUNITY)
Admission: EM | Admit: 2016-08-14 | Discharge: 2016-08-15 | Disposition: A | Discharge status: Home / Self Care | Payer: Medicare Other | Attending: Emergency Medicine | Admitting: Emergency Medicine

## 2016-08-14 DIAGNOSIS — Y658 Other specified misadventures during surgical and medical care: Secondary | ICD-10-CM | POA: Diagnosis not present

## 2016-08-14 DIAGNOSIS — T83098A Other mechanical complication of other indwelling urethral catheter, initial encounter: Secondary | ICD-10-CM | POA: Insufficient documentation

## 2016-08-14 DIAGNOSIS — Z79899 Other long term (current) drug therapy: Secondary | ICD-10-CM | POA: Diagnosis not present

## 2016-08-14 DIAGNOSIS — I10 Essential (primary) hypertension: Secondary | ICD-10-CM | POA: Insufficient documentation

## 2016-08-14 DIAGNOSIS — T839XXA Unspecified complication of genitourinary prosthetic device, implant and graft, initial encounter: Secondary | ICD-10-CM

## 2016-08-14 DIAGNOSIS — Z87891 Personal history of nicotine dependence: Secondary | ICD-10-CM | POA: Diagnosis not present

## 2016-08-14 DIAGNOSIS — R319 Hematuria, unspecified: Secondary | ICD-10-CM | POA: Diagnosis present

## 2016-08-14 LAB — BASIC METABOLIC PANEL
ANION GAP: 10 (ref 5–15)
BUN: 22 mg/dL — ABNORMAL HIGH (ref 6–20)
CALCIUM: 9.2 mg/dL (ref 8.9–10.3)
CO2: 24 mmol/L (ref 22–32)
Chloride: 103 mmol/L (ref 101–111)
Creatinine, Ser: 0.82 mg/dL (ref 0.44–1.00)
GFR calc Af Amer: >60 mL/min (ref >60)
Glucose, Bld: 97 mg/dL (ref 65–99)
Potassium: 3.7 mmol/L (ref 3.5–5.1)
Sodium: 137 mmol/L (ref 135–145)

## 2016-08-14 LAB — CBC WITH DIFFERENTIAL/PLATELET
BASOS ABS: 0 10*3/uL (ref 0.0–0.1)
Basophils Relative: 1 %
EOS PCT: 2 %
Eosinophils Absolute: 0.2 10*3/uL (ref 0.0–0.7)
HEMATOCRIT: 39.9 % (ref 36.0–46.0)
HEMOGLOBIN: 13.3 g/dL (ref 12.0–15.0)
LYMPHS PCT: 37 %
Lymphs Abs: 2.8 10*3/uL (ref 0.7–4.0)
MCH: 30.2 pg (ref 26.0–34.0)
MCHC: 33.3 g/dL (ref 30.0–36.0)
MCV: 90.5 fL (ref 78.0–100.0)
Monocytes Absolute: 0.7 10*3/uL (ref 0.1–1.0)
Monocytes Relative: 10 %
NEUTROS ABS: 3.7 10*3/uL (ref 1.7–7.7)
NEUTROS PCT: 50 %
PLATELETS: 180 10*3/uL (ref 150–400)
RBC: 4.41 MIL/uL (ref 3.87–5.11)
RDW: 12.8 % (ref 11.5–15.5)
WBC: 7.4 10*3/uL (ref 4.0–10.5)

## 2016-08-14 NOTE — ED Provider Notes (Signed)
[facility] DEPT Provider Note   CSN: [id number] Arrival date & time: 08/14/16  2211     History   Chief Complaint Chief Complaint  Patient presents with  . Hematuria    HPI Jackie Phelps is a 83 y.o. female.  Patient presents because her daughter thought that she saw blood in her Foley bag today. She reports it was a "cranberry color". Patient thinks that she is fine and doesn't need to be here. She has a Foley catheter in place after having a transurethral resection of bladder tumor on August 6 by Dr. Noah Delaine. She denies any fever, chills, nausea or vomiting. No abdominal pain or back pain. She is eating and drinking well. She feels the catheter is working properly. She is not on any blood thinners. The urine now appears clear in the Foley bag. She is scheduled to have it removed in the morning.   The history is provided by the patient and a relative.  Hematuria  Pertinent negatives include no chest pain, no abdominal pain, no headaches and no shortness of breath.    Past Medical History:  Diagnosis Date  . Anxiety   . Complication of anesthesia    Has increased BP after surgery.  . Constipation   . Hypercholesteremia   . Hypertension   . Impaired memory    due to severe sleep apnea,on CPAP now;stopped breathing 53.9 times per hour previously on study.  . Sleep apnea     Patient Active Problem List   Diagnosis Date Noted  . Iron deficiency anemia 01/22/2014  . Anxiety state 01/16/2014  . Cough 01/16/2014  . Left displaced femoral neck fracture ([place]) 01/08/2014  . Chronic constipation 02/03/2011  . Hypertension 02/03/2011    Past Surgical History:  Procedure Laterality Date  . CATARACT EXTRACTION, BILATERAL    . COLONOSCOPY    . COLONOSCOPY  03/05/2011   Procedure: COLONOSCOPY;  Surgeon: Rogene Houston, MD;  Location: [facility];  Service: Endoscopy;  Laterality: N/A;  1200  . CYSTOSCOPY W/ RETROGRADES Bilateral 08/11/2016   Procedure: CYSTOSCOPY WITH  BILATERAL RETROGRADE PYELOGRAM;  Surgeon: Cleon Gustin, MD;  Location: [facility];  Service: Urology;  Laterality: Bilateral;  . HIP ARTHROPLASTY Left 01/08/2014   Procedure: ARTHROPLASTY BIPOLAR HIP;  Surgeon: Marianna Payment, MD;  Location: [facility];  Service: Orthopedics;  Laterality: Left;  . TRANSURETHRAL RESECTION OF BLADDER TUMOR N/A 08/11/2016   Procedure: TRANSURETHRAL RESECTION OF BLADDER TUMOR (TURBT);  Surgeon: Cleon Gustin, MD;  Location: [facility];  Service: Urology;  Laterality: N/A;  . TRIGGER FINGER RELEASE Bilateral    ring and middle fingers.    OB History    No data available       Home Medications    Prior to Admission medications   Medication Sig Start Date End Date Taking? Authorizing Provider  amLODipine (NORVASC) 5 MG tablet Take 1 tablet by mouth daily. 08/28/15  Yes [provider]  carvedilol (COREG) 6.25 MG tablet Take 12.5 mg by mouth 2 (two) times daily with a meal.    Yes [provider]  diazepam (VALIUM) 5 MG tablet Take one tablet by mouth twice daily as needed for anxiety Patient taking differently: Take 5 mg by mouth 2 (two) times daily as needed for anxiety. Take one tablet by mouth twice daily as needed for anxiety 01/13/14  Yes Reed, Tiffany L, DO  donepezil (ARICEPT) 10 MG tablet Take 10 mg by mouth daily. 06/18/16  Yes [provider]  escitalopram (LEXAPRO) 5 MG tablet Take 5 mg by mouth at bedtime.  12/07/13  Yes [provider]  memantine (NAMENDA) 10 MG tablet Take 10 mg by mouth 2 (two) times daily.  06/18/16  Yes [provider]  multivitamin-lutein (OCUVITE-LUTEIN) CAPS capsule Take 1 capsule by mouth daily. 07/18/16  Yes [provider]  SB LOW DOSE ASA EC 81 MG EC tablet Take 81 mg by mouth daily. 05/23/16  Yes [provider]  simvastatin (ZOCOR) 20 MG tablet Take 20 mg by mouth daily.   Yes [provider]  traMADol (ULTRAM) 50 MG tablet Take 1 tablet (50 mg total) by  mouth every 6 (six) hours as needed. 08/11/16 08/11/17 Yes McKenzie, Candee Furbish, MD    Family History Family History  Problem Relation Age of Onset  . Hypertension Mother   . Healthy Daughter   . Colon cancer Neg Hx     Social History Social History  Substance Use Topics  . Smoking status: Former Smoker    Packs/day: 0.25    Years: 4.00    Types: Cigarettes    Quit date: 02/02/1966  . Smokeless tobacco: Never Used  . Alcohol use 1.2 oz/week    2 Glasses of wine per week     Comment: Drinks wine     Allergies   Penicillins and Hydrochlorothiazide   Review of Systems Review of Systems  Constitutional: Negative for activity change, appetite change and fever.  HENT: Negative for congestion and rhinorrhea.   Eyes: Negative for visual disturbance.  Respiratory: Negative for cough, chest tightness and shortness of breath.   Cardiovascular: Negative for chest pain.  Gastrointestinal: Negative for abdominal pain, nausea and vomiting.  Genitourinary: Positive for hematuria. Negative for dysuria, vaginal bleeding and vaginal discharge.  Musculoskeletal: Negative for arthralgias, back pain and myalgias.  Neurological: Negative for dizziness, weakness and headaches.    all other systems are negative except as noted in the HPI and PMH.    Physical Exam Updated Vital Signs BP (!) 141/75   Pulse (!) 59   Temp 98.5 F (36.9 C)   Resp 18   Ht 5\' 3"  (1.6 m)   Wt 57.6 kg (127 lb)   SpO2 100%   BMI 22.50 kg/m   Physical Exam  Constitutional: She is oriented to person, place, and time. She appears well-developed and well-nourished. No distress.  HENT:  Head: Normocephalic and atraumatic.  Mouth/Throat: Oropharynx is clear and moist. No oropharyngeal exudate.  Eyes: Pupils are equal, round, and reactive to light. Conjunctivae and EOM are normal.  Neck: Normal range of motion. Neck supple.  No meningismus.  Cardiovascular: Normal rate, regular rhythm, normal heart sounds and  intact distal pulses.   No murmur heard. Pulmonary/Chest: Effort normal and breath sounds normal. No respiratory distress.  Abdominal: Soft. There is no tenderness. There is no rebound and no guarding.  Genitourinary:  Genitourinary Comments: Foley catheter in place with clear urine  Musculoskeletal: Normal range of motion. She exhibits no edema or tenderness.  Neurological: She is alert and oriented to person, place, and time. No cranial nerve deficit. She exhibits normal muscle tone. Coordination normal.   5/5 strength throughout. CN 2-12 intact.Equal grip strength.   Skin: Skin is warm.  Psychiatric: She has a normal mood and affect. Her behavior is normal.  Nursing note and vitals reviewed.    ED Treatments / Results  Labs (all labs ordered are listed, but only abnormal results are displayed) Labs Reviewed  BASIC METABOLIC PANEL - Abnormal; Notable for the following:       Result Value   BUN 22 (*)    All other components within normal limits  URINALYSIS, ROUTINE W REFLEX MICROSCOPIC - Abnormal; Notable for the following:    Color, Urine COLORLESS (*)    Specific Gravity, Urine 1.002 (*)    Hgb urine dipstick LARGE (*)    Bacteria, UA RARE (*)    All other components within normal limits  URINE CULTURE  CBC WITH DIFFERENTIAL/PLATELET    EKG  EKG Interpretation None       Radiology No results found.  Procedures Procedures (including critical care time)  Medications Ordered in ED Medications - No data to display   Initial Impression / Assessment and Plan / ED Course  I have reviewed the triage vital signs and the nursing notes.  Pertinent labs & imaging results that were available during my care of the patient were reviewed by me and considered in my medical decision making (see chart for details).     Patient's daughter concerned about blood in Foley bag. Patient with recent urological surgery for hematuria and bladder tumor. Patient denies  complaints.  UA appears clear, blood seen on microscopic. Not unexpected due to recent surgery. Culture sent.  Creatinine is stable. No leukocytosis. Hemoglobin is stable. Bladder scan 13 mL.  Foley draining well. Labs at baseline. Urine culture sent.   Has urology follow up tomorrow. Patient and daughter reassured. Return precautions discussed.   Final Clinical Impressions(s) / ED Diagnoses   Final diagnoses:  Foley catheter problem, initial encounter [facility])    New Prescriptions New Prescriptions   No medications on file     Ezequiel Essex, MD 08/15/16 [phone]

## 2016-08-14 NOTE — ED Triage Notes (Signed)
Pt c/o decreased urine output and hematuria that started this evening. Pt has catheter in place from surgery on Monday and states urine has been yellow/clear the last couple of days.

## 2016-08-15 ENCOUNTER — Ambulatory Visit (INDEPENDENT_AMBULATORY_CARE_PROVIDER_SITE_OTHER): Payer: Medicare Other | Admitting: Urology

## 2016-08-15 DIAGNOSIS — R310 Gross hematuria: Secondary | ICD-10-CM | POA: Diagnosis not present

## 2016-08-15 LAB — URINALYSIS, ROUTINE W REFLEX MICROSCOPIC
Bilirubin Urine: NEGATIVE
Glucose, UA: NEGATIVE mg/dL
Ketones, ur: NEGATIVE mg/dL
Leukocytes, UA: NEGATIVE
Nitrite: NEGATIVE
Protein, ur: NEGATIVE mg/dL
SPECIFIC GRAVITY, URINE: 1.002 — AB (ref 1.005–1.030)
SQUAMOUS EPITHELIAL / LPF: NONE SEEN
pH: 7 (ref 5.0–8.0)

## 2016-08-15 NOTE — Discharge Instructions (Signed)
Your catheter is working appropriately. Your urine was sent for culture. Follow up with Dr. Alyson Ingles as scheduled. Return to the ED if you develop new or worsening symptoms.

## 2016-08-16 LAB — URINE CULTURE: CULTURE: NO GROWTH

## 2016-08-20 ENCOUNTER — Ambulatory Visit (INDEPENDENT_AMBULATORY_CARE_PROVIDER_SITE_OTHER): Payer: Medicare Other | Admitting: Urology

## 2016-08-20 DIAGNOSIS — C675 Malignant neoplasm of bladder neck: Secondary | ICD-10-CM

## 2016-11-24 ENCOUNTER — Emergency Department (HOSPITAL_COMMUNITY)
Admission: EM | Admit: 2016-11-24 | Discharge: 2016-11-24 | Disposition: A | Discharge status: Home / Self Care | Payer: Medicare Other | Attending: Emergency Medicine | Admitting: Emergency Medicine

## 2016-11-24 ENCOUNTER — Encounter (HOSPITAL_COMMUNITY): Payer: Self-pay | Admitting: Cardiology

## 2016-11-24 DIAGNOSIS — I10 Essential (primary) hypertension: Secondary | ICD-10-CM | POA: Diagnosis not present

## 2016-11-24 DIAGNOSIS — R319 Hematuria, unspecified: Secondary | ICD-10-CM

## 2016-11-24 DIAGNOSIS — Z87891 Personal history of nicotine dependence: Secondary | ICD-10-CM | POA: Diagnosis not present

## 2016-11-24 DIAGNOSIS — Z79899 Other long term (current) drug therapy: Secondary | ICD-10-CM | POA: Diagnosis not present

## 2016-11-24 DIAGNOSIS — N390 Urinary tract infection, site not specified: Secondary | ICD-10-CM | POA: Diagnosis not present

## 2016-11-24 LAB — URINALYSIS, ROUTINE W REFLEX MICROSCOPIC
BILIRUBIN URINE: NEGATIVE
Glucose, UA: NEGATIVE mg/dL
Ketones, ur: NEGATIVE mg/dL
NITRITE: NEGATIVE
PH: 6 (ref 5.0–8.0)
Protein, ur: 100 mg/dL — AB
SPECIFIC GRAVITY, URINE: 1.005 (ref 1.005–1.030)

## 2016-11-24 MED ORDER — SULFAMETHOXAZOLE-TRIMETHOPRIM 800-160 MG PO TABS
1.0000 | ORAL_TABLET | Freq: Once | ORAL | Status: AC
  Administered 2016-11-24: 1 via ORAL
  Filled 2016-11-24: qty 1

## 2016-11-24 MED ORDER — ONDANSETRON HCL 4 MG PO TABS
4.0000 mg | ORAL_TABLET | Freq: Once | ORAL | Status: AC
  Administered 2016-11-24: 4 mg via ORAL
  Filled 2016-11-24: qty 1

## 2016-11-24 MED ORDER — SULFAMETHOXAZOLE-TRIMETHOPRIM 800-160 MG PO TABS: 1.0000 | ORAL_TABLET | Freq: Two times a day (BID) | ORAL | 0 refills | Status: AC

## 2016-11-24 NOTE — Discharge Instructions (Addendum)
Vital signs within normal limits.  Your oxygen level is 97% on room air.  Which is within normal limits.  Your urine test suggest a urinary tract infection.  A culture has been sent to the lab.  Please use Bactrim 2 times daily with food.  Please have your urine rechecked in 7-10 days to document resolution of this problem. Please increase fluids. Please keep your appointment with urology as scheduled.

## 2016-11-24 NOTE — ED Triage Notes (Signed)
Noticed blood in her urine this morning.  No other complaints.

## 2016-11-24 NOTE — ED Provider Notes (Signed)
[facility] EMERGENCY DEPARTMENT Provider Note   CSN: [id number] Arrival date & time: 11/24/16  1256     History   Chief Complaint Chief Complaint  Patient presents with  . Hematuria    HPI Jackie Phelps is a 83 y.o. female.  Patient is an 83 year old female who presents to the emergency department with a complaint of blood in her urine.  The patient states that approximately 3 or 4 months ago she had a tumor removed from her bladder area.  She states she has been getting along well since that time.  Earlier this morning she noted some blood in her urine.  She notified her husband who then notified her daughter and they made arrangements for her to come to the emergency room.  Patient states she has not had fever or chills.  She has not had any back pain.  No nausea, no vomiting, and no difficulty with malaise.        Past Medical History:  Diagnosis Date  . Anxiety   . Complication of anesthesia    Has increased BP after surgery.  . Constipation   . Hypercholesteremia   . Hypertension   . Impaired memory    due to severe sleep apnea,on CPAP now;stopped breathing 53.9 times per hour previously on study.  . Sleep apnea     Patient Active Problem List   Diagnosis Date Noted  . Iron deficiency anemia 01/22/2014  . Anxiety state 01/16/2014  . Cough 01/16/2014  . Left displaced femoral neck fracture ([place]) 01/08/2014  . Chronic constipation 02/03/2011  . Hypertension 02/03/2011    Past Surgical History:  Procedure Laterality Date  . ARTHROPLASTY BIPOLAR HIP Left 01/08/2014   Performed by Marianna Payment, MD at [facility]  . CATARACT EXTRACTION, BILATERAL    . COLONOSCOPY    . COLONOSCOPY N/A 03/05/2011   Performed by Rogene Houston, MD at [facility]  . CYSTOSCOPY WITH BILATERAL RETROGRADE PYELOGRAM Bilateral 08/11/2016   Performed by Cleon Gustin, MD at [facility]  . TRANSURETHRAL RESECTION OF BLADDER TUMOR (TURBT) N/A 08/11/2016   Performed by Cleon Gustin, MD at [facility]  . TRIGGER FINGER RELEASE Bilateral    ring and middle fingers.    OB History    No data available       Home Medications    Prior to Admission medications   Medication Sig Start Date End Date Taking? Authorizing Provider  amLODipine (NORVASC) 5 MG tablet Take 1 tablet by mouth daily. 08/28/15   [provider]  carvedilol (COREG) 6.25 MG tablet Take 12.5 mg by mouth 2 (two) times daily with a meal.     [provider]  diazepam (VALIUM) 5 MG tablet Take one tablet by mouth twice daily as needed for anxiety Patient taking differently: Take 5 mg by mouth 2 (two) times daily as needed for anxiety. Take one tablet by mouth twice daily as needed for anxiety 01/13/14   Reed, Tiffany L, DO  donepezil (ARICEPT) 10 MG tablet Take 10 mg by mouth daily. 06/18/16   [provider]  escitalopram (LEXAPRO) 5 MG tablet Take 5 mg by mouth at bedtime.  12/07/13   [provider]  memantine (NAMENDA) 10 MG tablet Take 10 mg by mouth 2 (two) times daily.  06/18/16   [provider]  multivitamin-lutein (OCUVITE-LUTEIN) CAPS capsule Take 1 capsule by mouth daily. 07/18/16   [provider]  SB LOW DOSE ASA EC  81 MG EC tablet Take 81 mg by mouth daily. 05/23/16   [provider]  simvastatin (ZOCOR) 20 MG tablet Take 20 mg by mouth daily.    [provider]  traMADol (ULTRAM) 50 MG tablet Take 1 tablet (50 mg total) by mouth every 6 (six) hours as needed. 08/11/16 08/11/17  Cleon Gustin, MD    Family History Family History  Problem Relation Age of Onset  . Hypertension Mother   . Healthy Daughter   . Colon cancer Neg Hx     Social History Social History   Tobacco Use  . Smoking status: Former Smoker    Packs/day: 0.25    Years: 4.00    Pack years: 1.00    Types: Cigarettes    Last attempt to quit: 02/02/1966    Years since quitting: 50.8  . Smokeless tobacco: Never Used  Substance Use Topics  .  Alcohol use: Yes    Alcohol/week: 1.2 oz    Types: 2 Glasses of wine per week    Comment: Drinks wine  . Drug use: No     Allergies   Penicillins and Hydrochlorothiazide   Review of Systems Review of Systems  Constitutional: Negative for activity change.       All ROS Neg except as noted in HPI  HENT: Negative for nosebleeds.   Eyes: Negative for photophobia and discharge.  Respiratory: Negative for cough, shortness of breath and wheezing.   Cardiovascular: Negative for chest pain and palpitations.  Gastrointestinal: Negative for abdominal pain and blood in stool.  Genitourinary: Positive for hematuria. Negative for dysuria and frequency.  Musculoskeletal: Negative for arthralgias, back pain and neck pain.  Skin: Negative.   Neurological: Negative for dizziness, seizures and speech difficulty.  Psychiatric/Behavioral: Negative for confusion and hallucinations.     Physical Exam Updated Vital Signs BP 124/63 (BP Location: Right Arm)   Pulse 70   Temp 98.4 F (36.9 C) (Oral)   Resp 16   Ht 5\' 2"  (1.575 m)   Wt 57.6 kg (127 lb)   SpO2 97%   BMI 23.23 kg/m   Physical Exam  Constitutional: She is oriented to person, place, and time. She appears well-developed and well-nourished.  Non-toxic appearance.  HENT:  Head: Normocephalic.  Right Ear: Tympanic membrane and external ear normal.  Left Ear: Tympanic membrane and external ear normal.  Eyes: EOM and lids are normal. Pupils are equal, round, and reactive to light.  Neck: Normal range of motion. Neck supple. Carotid bruit is not present.  Cardiovascular: Normal rate, regular rhythm, normal heart sounds, intact distal pulses and normal pulses.  Pulmonary/Chest: Breath sounds normal. No respiratory distress.  Abdominal: Soft. Bowel sounds are normal. There is no tenderness. There is no guarding.  No CVA tenderness.  Musculoskeletal: Normal range of motion.  Lymphadenopathy:       Head (right side): No submandibular  adenopathy present.       Head (left side): No submandibular adenopathy present.    She has no cervical adenopathy.  Neurological: She is alert and oriented to person, place, and time. She has normal strength. No cranial nerve deficit or sensory deficit.  Skin: Skin is warm and dry.  Psychiatric: She has a normal mood and affect. Her speech is normal.  Nursing note and vitals reviewed.    ED Treatments / Results  Labs (all labs ordered are listed, but only abnormal results are displayed) Labs Reviewed  URINALYSIS, ROUTINE W REFLEX MICROSCOPIC - Abnormal; Notable for  the following components:      Result Value   APPearance HAZY (*)    Hgb urine dipstick LARGE (*)    Protein, ur 100 (*)    Leukocytes, UA LARGE (*)    Bacteria, UA RARE (*)    Squamous Epithelial / LPF 0-5 (*)    Non Squamous Epithelial 0-5 (*)    All other components within normal limits  URINE CULTURE    EKG  EKG Interpretation None       Radiology No results found.  Procedures Procedures (including critical care time)  Medications Ordered in ED Medications - No data to display   Initial Impression / Assessment and Plan / ED Course  I have reviewed the triage vital signs and the nursing notes.  Pertinent labs & imaging results that were available during my care of the patient were reviewed by me and considered in my medical decision making (see chart for details).       Final Clinical Impressions(s) / ED Diagnoses MDM Vital signs within normal limits.  Pulse oximetry is 97% on room air.  Within normal limits by my interpretation.  The patient presents today because she noted blood in her urine.  In August of this year she had a tumor removed from her bladder.  She states she has been healing nicely from this event.  She has not had fever or chills to be reported.  She has not had back pain, nausea, or vomiting.  Urinalysis suggest urinary tract infection.  The patient is awake and alert  ambulatory without any problem whatsoever and in no distress.  Patient has a penicillin allergy.  Will use Bactrim DS, will send the urine for culture.  Patient has an appointment with urology on Wednesday, November 21.  Patient and family in agreement with this plan.   Final diagnoses:  Urinary tract infection with hematuria, site unspecified    ED Discharge Orders        Ordered    sulfamethoxazole-trimethoprim (BACTRIM DS,SEPTRA DS) 800-160 MG tablet  2 times daily     11/24/16 1655       Lily Kocher, PA-C 11/24/16 1701    Milton Ferguson, MD 11/24/16 2355

## 2016-11-26 ENCOUNTER — Ambulatory Visit: Payer: Medicare Other | Admitting: Urology

## 2016-11-26 LAB — URINE CULTURE: Special Requests: NORMAL

## 2016-12-03 ENCOUNTER — Ambulatory Visit (INDEPENDENT_AMBULATORY_CARE_PROVIDER_SITE_OTHER): Payer: Medicare Other | Admitting: Urology

## 2016-12-03 DIAGNOSIS — C675 Malignant neoplasm of bladder neck: Secondary | ICD-10-CM | POA: Diagnosis not present

## 2016-12-29 ENCOUNTER — Other Ambulatory Visit: Payer: Self-pay

## 2016-12-29 ENCOUNTER — Encounter (HOSPITAL_COMMUNITY): Payer: Self-pay | Admitting: Emergency Medicine

## 2016-12-29 ENCOUNTER — Emergency Department (HOSPITAL_COMMUNITY)
Admission: EM | Admit: 2016-12-29 | Discharge: 2016-12-29 | Disposition: A | Discharge status: Home / Self Care | Payer: Medicare Other | Attending: Emergency Medicine | Admitting: Emergency Medicine

## 2016-12-29 DIAGNOSIS — I10 Essential (primary) hypertension: Secondary | ICD-10-CM | POA: Diagnosis not present

## 2016-12-29 DIAGNOSIS — Z79899 Other long term (current) drug therapy: Secondary | ICD-10-CM | POA: Insufficient documentation

## 2016-12-29 DIAGNOSIS — R319 Hematuria, unspecified: Secondary | ICD-10-CM | POA: Diagnosis present

## 2016-12-29 DIAGNOSIS — N3001 Acute cystitis with hematuria: Secondary | ICD-10-CM

## 2016-12-29 DIAGNOSIS — B3749 Other urogenital candidiasis: Secondary | ICD-10-CM

## 2016-12-29 DIAGNOSIS — Z87891 Personal history of nicotine dependence: Secondary | ICD-10-CM | POA: Insufficient documentation

## 2016-12-29 DIAGNOSIS — B373 Candidiasis of vulva and vagina: Secondary | ICD-10-CM | POA: Insufficient documentation

## 2016-12-29 LAB — URINALYSIS, ROUTINE W REFLEX MICROSCOPIC
BACTERIA UA: NONE SEEN
BILIRUBIN URINE: NEGATIVE
Glucose, UA: NEGATIVE mg/dL
KETONES UR: NEGATIVE mg/dL
Nitrite: NEGATIVE
Protein, ur: 100 mg/dL — AB
SQUAMOUS EPITHELIAL / LPF: NONE SEEN
Specific Gravity, Urine: 1.016 (ref 1.005–1.030)
pH: 6 (ref 5.0–8.0)

## 2016-12-29 MED ORDER — SULFAMETHOXAZOLE-TRIMETHOPRIM 800-160 MG PO TABS
1.0000 | ORAL_TABLET | Freq: Once | ORAL | Status: AC
  Administered 2016-12-29: 1 via ORAL
  Filled 2016-12-29: qty 1

## 2016-12-29 MED ORDER — PHENAZOPYRIDINE HCL 200 MG PO TABS: 200.0000 mg | ORAL_TABLET | Freq: Three times a day (TID) | ORAL | 0 refills | Status: DC

## 2016-12-29 MED ORDER — FLUCONAZOLE 200 MG PO TABS: 200.0000 mg | ORAL_TABLET | Freq: Every day | ORAL | 0 refills | Status: DC

## 2016-12-29 MED ORDER — SULFAMETHOXAZOLE-TRIMETHOPRIM 800-160 MG PO TABS: 1.0000 | ORAL_TABLET | Freq: Two times a day (BID) | ORAL | 0 refills | Status: DC

## 2016-12-29 MED ORDER — FLUCONAZOLE 100 MG PO TABS
150.0000 mg | ORAL_TABLET | Freq: Once | ORAL | Status: AC
  Administered 2016-12-29: 150 mg via ORAL
  Filled 2016-12-29: qty 1.5

## 2016-12-29 MED ORDER — PHENAZOPYRIDINE HCL 100 MG PO TABS
200.0000 mg | ORAL_TABLET | Freq: Once | ORAL | Status: AC
  Administered 2016-12-29: 200 mg via ORAL
  Filled 2016-12-29: qty 2

## 2016-12-29 MED ORDER — SULFAMETHOXAZOLE-TRIMETHOPRIM 800-160 MG PO TABS: 1.0000 | ORAL_TABLET | Freq: Two times a day (BID) | ORAL | 0 refills | Status: AC

## 2016-12-29 NOTE — Discharge Instructions (Signed)
Take the entire course of the antibiotics prescribed.  Remember that pyridium will turn your urine bright orange and is normal. Drink plenty of fluids.  Get rechecked for any fevers, worsened or new symptoms.

## 2016-12-29 NOTE — ED Provider Notes (Signed)
[facility] EMERGENCY DEPARTMENT Provider Note   CSN: [id number] Arrival date & time: 12/29/16  1208     History   Chief Complaint Chief Complaint  Patient presents with  . Hematuria    HPI Jackie Phelps is a 83 y.o. female with a past medical history as outlined below pertinent for history of prior bladder tumor followed by Dr. Alyson Ingles who underwent a cystoscopy about one month ago with stable findings.  Today she developed dysuria and hematuria with increased urinary frequency but denies urgency.  She also has had no fevers, chills, nausea, vomiting or back pain. She has had no medications prior to arrival for this complaint.  The history is provided by the patient and a relative.    Past Medical History:  Diagnosis Date  . Anxiety   . Complication of anesthesia    Has increased BP after surgery.  . Constipation   . Hypercholesteremia   . Hypertension   . Impaired memory    due to severe sleep apnea,on CPAP now;stopped breathing 53.9 times per hour previously on study.  . Sleep apnea     Patient Active Problem List   Diagnosis Date Noted  . Iron deficiency anemia 01/22/2014  . Anxiety state 01/16/2014  . Cough 01/16/2014  . Left displaced femoral neck fracture ([place]) 01/08/2014  . Chronic constipation 02/03/2011  . Hypertension 02/03/2011    Past Surgical History:  Procedure Laterality Date  . CATARACT EXTRACTION, BILATERAL    . COLONOSCOPY    . COLONOSCOPY  03/05/2011   Procedure: COLONOSCOPY;  Surgeon: Rogene Houston, MD;  Location: [facility];  Service: Endoscopy;  Laterality: N/A;  1200  . CYSTOSCOPY W/ RETROGRADES Bilateral 08/11/2016   Procedure: CYSTOSCOPY WITH BILATERAL RETROGRADE PYELOGRAM;  Surgeon: Cleon Gustin, MD;  Location: [facility] ORS;  Service: Urology;  Laterality: Bilateral;  . HIP ARTHROPLASTY Left 01/08/2014   Procedure: ARTHROPLASTY BIPOLAR HIP;  Surgeon: Marianna Payment, MD;  Location: [facility];  Service: Orthopedics;  Laterality:  Left;  . TRANSURETHRAL RESECTION OF BLADDER TUMOR N/A 08/11/2016   Procedure: TRANSURETHRAL RESECTION OF BLADDER TUMOR (TURBT);  Surgeon: Cleon Gustin, MD;  Location: [facility];  Service: Urology;  Laterality: N/A;  . TRIGGER FINGER RELEASE Bilateral    ring and middle fingers.    OB History    No data available       Home Medications    Prior to Admission medications   Medication Sig Start Date End Date Taking? Authorizing Provider  amLODipine (NORVASC) 5 MG tablet Take 1 tablet by mouth daily. 08/28/15   [provider]  carvedilol (COREG) 6.25 MG tablet Take 12.5 mg by mouth 2 (two) times daily with a meal.     [provider]  diazepam (VALIUM) 5 MG tablet Take one tablet by mouth twice daily as needed for anxiety Patient taking differently: Take 5 mg by mouth 2 (two) times daily as needed for anxiety. Take one tablet by mouth twice daily as needed for anxiety 01/13/14   Reed, Tiffany L, DO  donepezil (ARICEPT) 10 MG tablet Take 10 mg by mouth daily. 06/18/16   [provider]  escitalopram (LEXAPRO) 5 MG tablet Take 5 mg by mouth at bedtime.  12/07/13   [provider]  memantine (NAMENDA) 10 MG tablet Take 10 mg by mouth 2 (two) times daily.  06/18/16   [provider]  multivitamin-lutein (OCUVITE-LUTEIN) CAPS capsule Take 1 capsule by mouth daily. 07/18/16   [provider]  phenazopyridine (PYRIDIUM) 200 MG tablet Take 1 tablet (200 mg total) by mouth 3 (three) times daily. 12/29/16   Evalee Jefferson, PA-C  SB LOW DOSE ASA EC 81 MG EC tablet Take 81 mg by mouth daily. 05/23/16   [provider]  simvastatin (ZOCOR) 20 MG tablet Take 20 mg by mouth daily.    [provider]  sulfamethoxazole-trimethoprim (BACTRIM DS,SEPTRA DS) 800-160 MG tablet Take 1 tablet by mouth 2 (two) times daily for 7 days. 12/29/16 01/05/17  Evalee Jefferson, PA-C  traMADol (ULTRAM) 50 MG tablet Take 1 tablet (50 mg total) by mouth every 6 (six)  hours as needed. 08/11/16 08/11/17  Cleon Gustin, MD    Family History Family History  Problem Relation Age of Onset  . Hypertension Mother   . Healthy Daughter   . Colon cancer Neg Hx     Social History Social History   Tobacco Use  . Smoking status: Former Smoker    Packs/day: 0.25    Years: 4.00    Pack years: 1.00    Types: Cigarettes    Last attempt to quit: 02/02/1966    Years since quitting: 50.9  . Smokeless tobacco: Never Used  Substance Use Topics  . Alcohol use: Yes    Alcohol/week: 1.2 oz    Types: 2 Glasses of wine per week    Comment: Drinks wine  . Drug use: No     Allergies   Penicillins and Hydrochlorothiazide   Review of Systems Review of Systems  Constitutional: Negative for chills and fever.  HENT: Negative for congestion and sore throat.   Eyes: Negative.   Respiratory: Negative for chest tightness and shortness of breath.   Cardiovascular: Negative for chest pain.  Gastrointestinal: Negative for abdominal pain and nausea.  Genitourinary: Positive for dysuria, frequency and hematuria. Negative for flank pain, pelvic pain, urgency and vaginal discharge.  Musculoskeletal: Negative for arthralgias, back pain, joint swelling and neck pain.  Skin: Negative.  Negative for rash and wound.  Neurological: Negative for dizziness, weakness, light-headedness, numbness and headaches.  Psychiatric/Behavioral: Negative.      Physical Exam Updated Vital Signs BP (!) 160/73 (BP Location: Right Arm)   Pulse 61   Temp 98 F (36.7 C) (Oral)   Resp 16   Ht 5\' 4"  (1.626 m)   Wt 54.4 kg (120 lb)   SpO2 97%   BMI 20.60 kg/m   Physical Exam  Constitutional: She is oriented to person, place, and time. She appears well-developed and well-nourished.  HENT:  Head: Normocephalic and atraumatic.  Eyes: Conjunctivae are normal.  Neck: Normal range of motion.  Cardiovascular: Normal rate.  Pulmonary/Chest: Effort normal.  Abdominal: Soft. She exhibits no  mass. There is no tenderness. There is no guarding.  Musculoskeletal: Normal range of motion.  Neurological: She is alert and oriented to person, place, and time.  Skin: Skin is warm and dry.  Psychiatric: She has a normal mood and affect.  Nursing note and vitals reviewed.    ED Treatments / Results  Labs (all labs ordered are listed, but only abnormal results are displayed) Labs Reviewed  URINALYSIS, ROUTINE W REFLEX MICROSCOPIC - Abnormal; Notable for the following components:      Result Value   APPearance CLOUDY (*)    Hgb urine dipstick LARGE (*)    Protein, ur 100 (*)    Leukocytes, UA LARGE (*)    Non Squamous Epithelial 0-5 (*)    All other components within  normal limits  URINE CULTURE    EKG  EKG Interpretation None       Radiology No results found.  Procedures Procedures (including critical care time)  Medications Ordered in ED Medications  phenazopyridine (PYRIDIUM) tablet 200 mg (200 mg Oral Given 12/29/16 1643)  sulfamethoxazole-trimethoprim (BACTRIM DS,SEPTRA DS) 800-160 MG per tablet 1 tablet (1 tablet Oral Given 12/29/16 1643)  fluconazole (DIFLUCAN) tablet 150 mg (150 mg Oral Given 12/29/16 1737)     Initial Impression / Assessment and Plan / ED Course  I have reviewed the triage vital signs and the nursing notes.  Pertinent labs & imaging results that were available during my care of the patient were reviewed by me and considered in my medical decision making (see chart for details).     Labs reviewed and discussed with patient and family.  Urine culture was ordered.  Patient was started on Bactrim and Pyridium with first dose given here.  She is also given a Diflucan tablet given the yeast found on the specimen.  She denied having any vaginal irritation or discharge.  Plan follow-up with Dr. Alyson Ingles or her PCP as needed for any persistent symptoms.  Return precautions discussed. Final Clinical Impressions(s) / ED Diagnoses   Final  diagnoses:  Acute cystitis with hematuria  Yeast UTI    ED Discharge Orders        Ordered    sulfamethoxazole-trimethoprim (BACTRIM DS,SEPTRA DS) 800-160 MG tablet  2 times daily,   Status:  Discontinued     12/29/16 1634    phenazopyridine (PYRIDIUM) 200 MG tablet  3 times daily,   Status:  Discontinued     12/29/16 1634    fluconazole (DIFLUCAN) 200 MG tablet  Daily,   Status:  Discontinued     12/29/16 1634    phenazopyridine (PYRIDIUM) 200 MG tablet  3 times daily     12/29/16 1635    sulfamethoxazole-trimethoprim (BACTRIM DS,SEPTRA DS) 800-160 MG tablet  2 times daily     12/29/16 1635       Evalee Jefferson, Hershal Coria 12/29/16 1925    Fredia Sorrow, MD 12/29/16 2255

## 2016-12-29 NOTE — ED Triage Notes (Signed)
Pt reports had a recent cystoscopy several weeks ago. Pt reports hematuria, increased urinary frequency and dysuria. No known fever,chills, abd pain.

## 2017-01-01 LAB — URINE CULTURE
Culture: NO GROWTH
SPECIAL REQUESTS: NORMAL

## 2017-05-04 IMAGING — DX DG HAND COMPLETE 3+V*L*
3 series · 3 of 3 positions shown · non-contrast
Comparison: None.

CLINICAL DATA: Fall, left hand injury

EXAM:
LEFT HAND - COMPLETE 3+ VIEW

[hand pa]
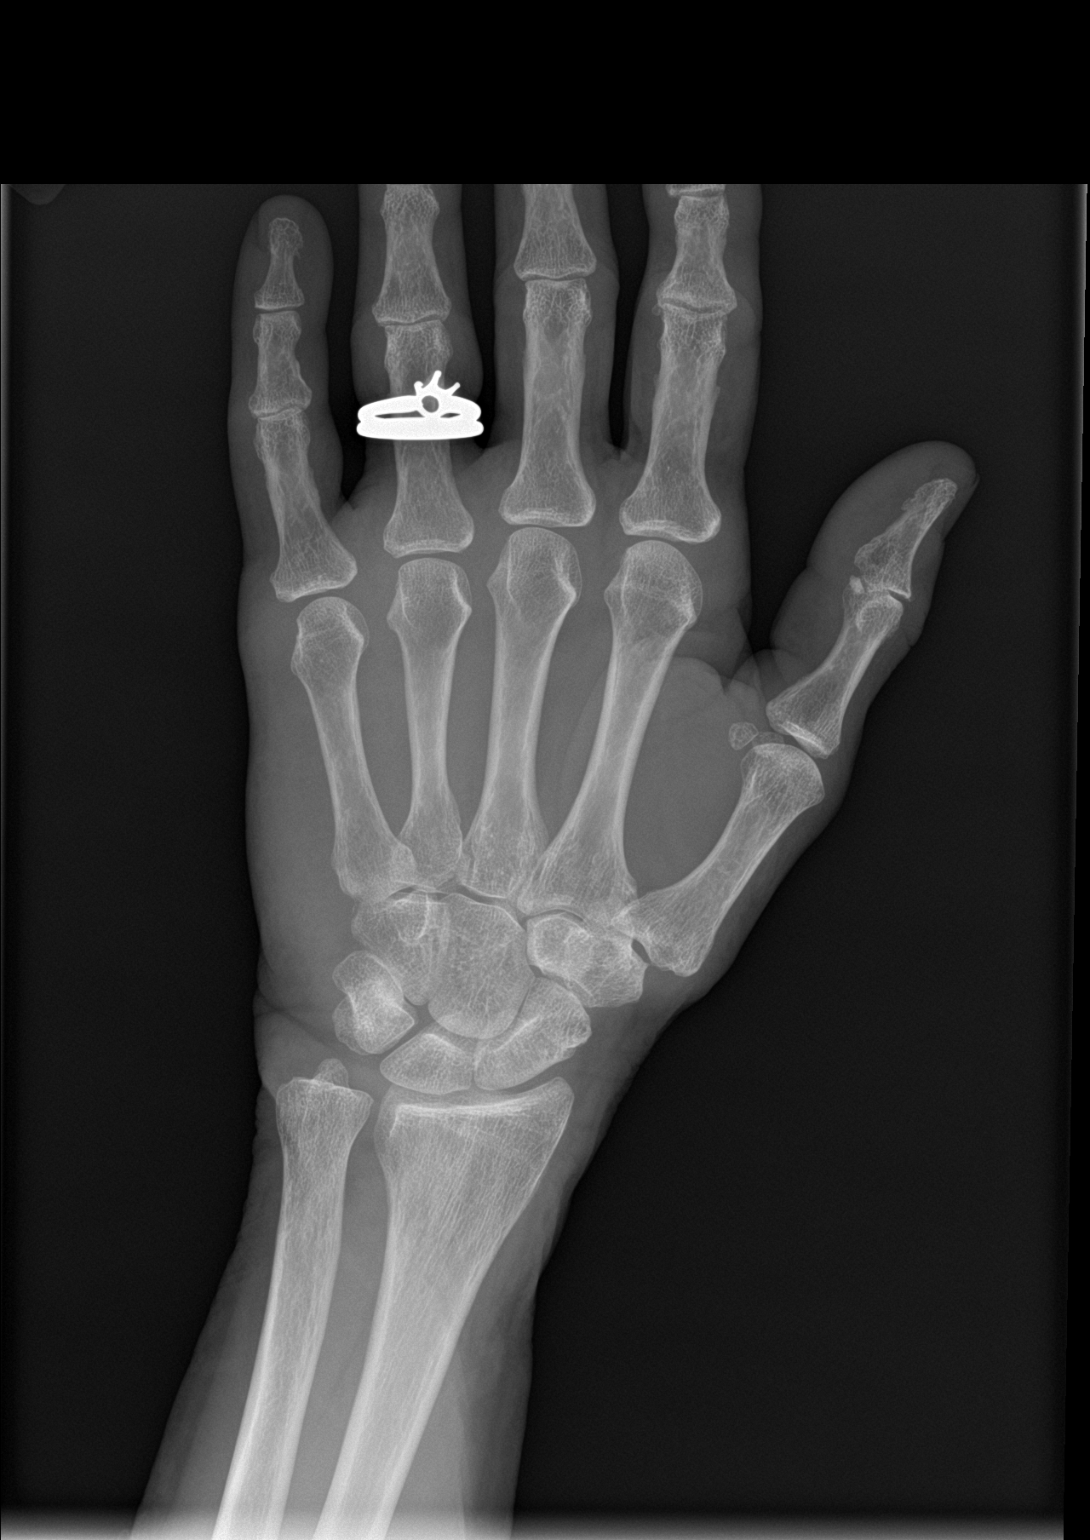

[hand obl]
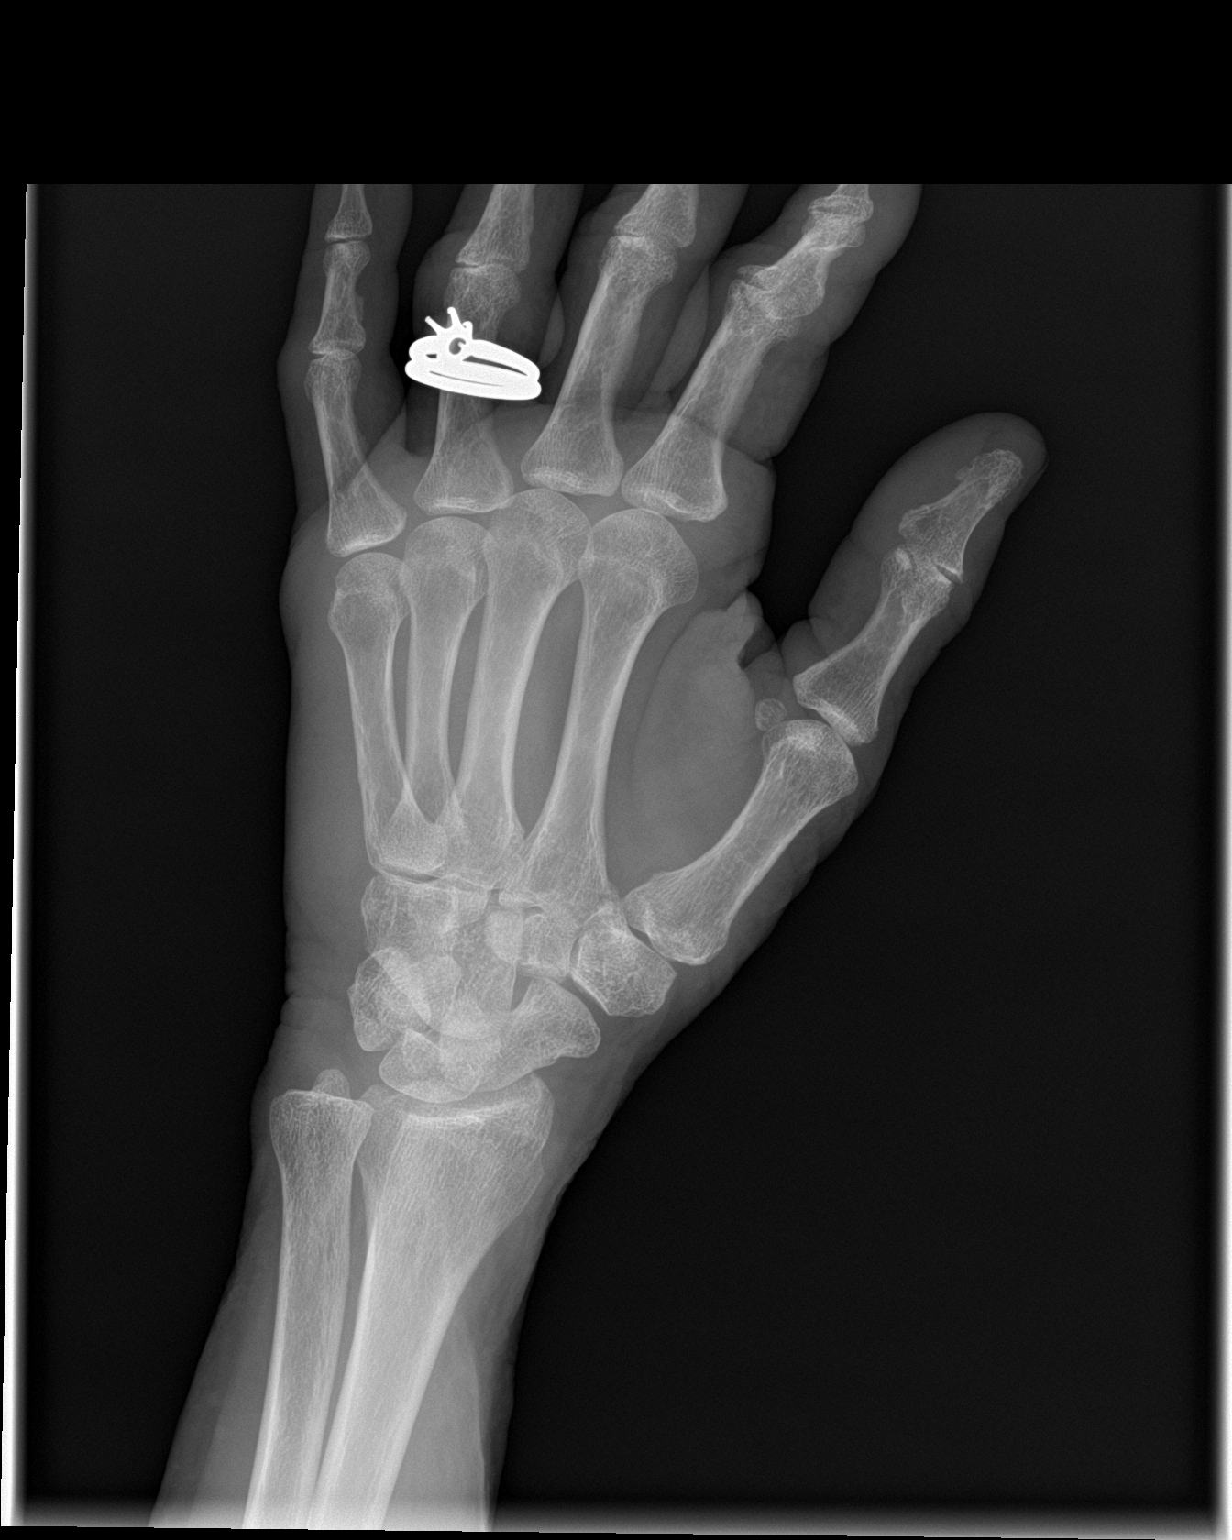

[hand lat]
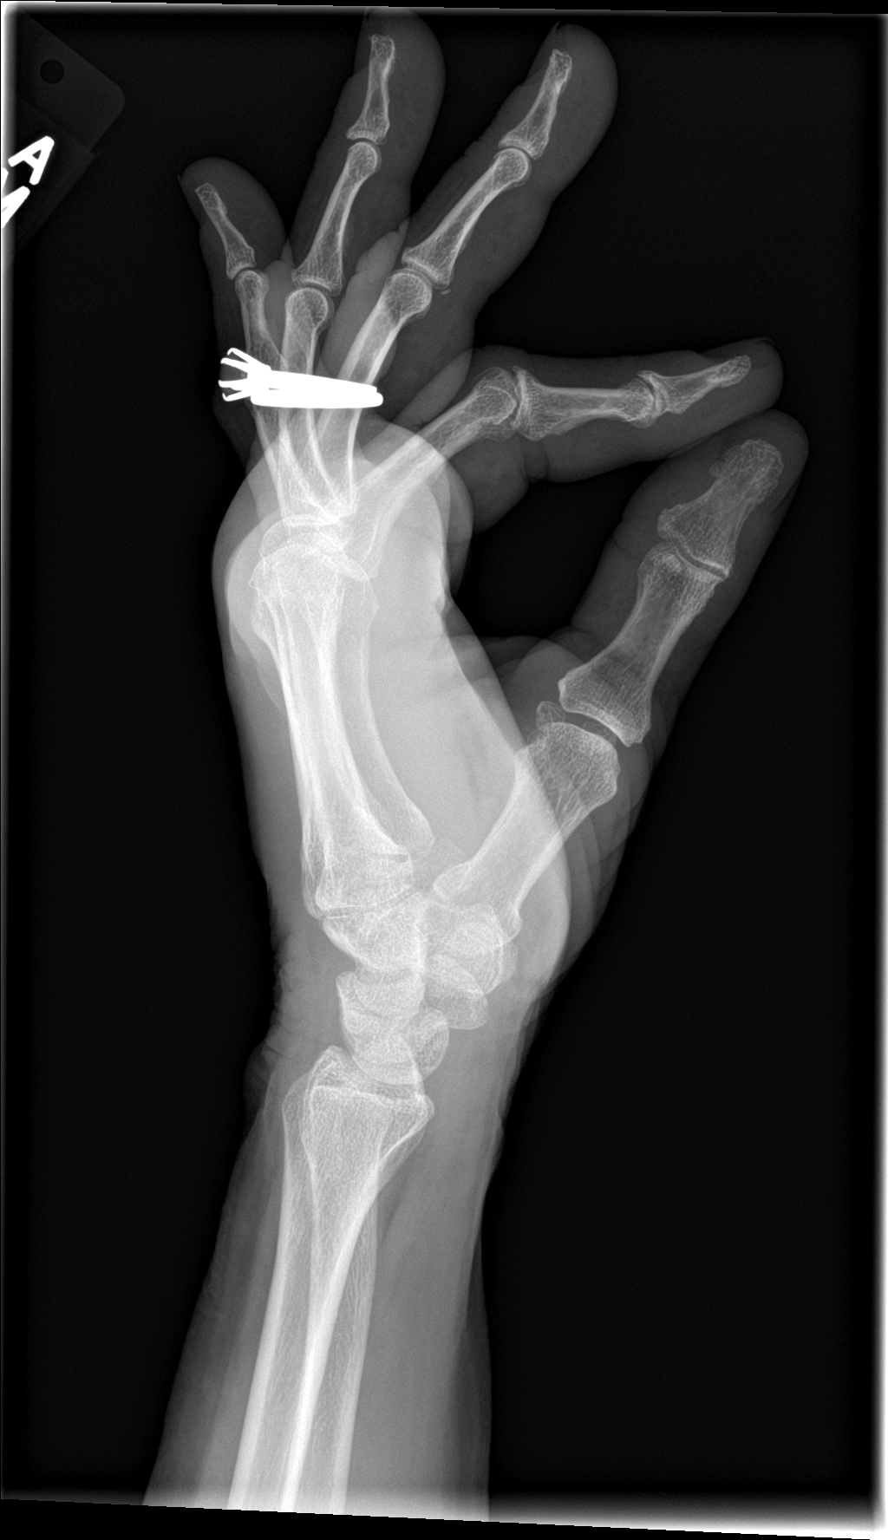

[3 of 3 positions shown; findings below may reference images not displayed]

FINDINGS: No fracture or dislocation is seen.

Mild degenerative changes of the 2nd and 3rd DIP joints and IP
joints.

Well corticated osseous density along the ventral base of the 3rd
middle phalanx on the lateral view, not acute.

Visualized soft tissues are within normal limits.
IMPRESSION: No fracture or dislocation is seen.

## 2017-06-10 ENCOUNTER — Ambulatory Visit (INDEPENDENT_AMBULATORY_CARE_PROVIDER_SITE_OTHER): Payer: Medicare Other | Admitting: Urology

## 2017-06-10 DIAGNOSIS — C675 Malignant neoplasm of bladder neck: Secondary | ICD-10-CM | POA: Diagnosis not present

## 2017-10-27 ENCOUNTER — Other Ambulatory Visit (HOSPITAL_COMMUNITY): Payer: Self-pay | Admitting: Internal Medicine

## 2017-10-27 DIAGNOSIS — Z13820 Encounter for screening for osteoporosis: Secondary | ICD-10-CM

## 2017-11-12 ENCOUNTER — Ambulatory Visit (HOSPITAL_COMMUNITY)
Admission: RE | Admit: 2017-11-12 | Discharge: 2017-11-12 | Disposition: A | Discharge status: Home / Self Care | Payer: Medicare Other | Source: Ambulatory Visit | Attending: Internal Medicine | Admitting: Internal Medicine

## 2017-11-12 DIAGNOSIS — M8589 Other specified disorders of bone density and structure, multiple sites: Secondary | ICD-10-CM | POA: Insufficient documentation

## 2017-11-12 DIAGNOSIS — Z13820 Encounter for screening for osteoporosis: Secondary | ICD-10-CM | POA: Insufficient documentation

## 2017-11-12 DIAGNOSIS — Z780 Asymptomatic menopausal state: Secondary | ICD-10-CM | POA: Diagnosis not present

## 2017-12-09 ENCOUNTER — Ambulatory Visit: Payer: Medicare Other | Admitting: Urology

## 2018-03-26 ENCOUNTER — Encounter (INDEPENDENT_AMBULATORY_CARE_PROVIDER_SITE_OTHER): Payer: Self-pay | Admitting: Nurse Practitioner

## 2018-03-26 ENCOUNTER — Encounter (INDEPENDENT_AMBULATORY_CARE_PROVIDER_SITE_OTHER): Payer: Self-pay | Admitting: Internal Medicine

## 2018-03-31 ENCOUNTER — Ambulatory Visit: Payer: Medicare Other | Admitting: Urology

## 2018-06-02 ENCOUNTER — Ambulatory Visit (INDEPENDENT_AMBULATORY_CARE_PROVIDER_SITE_OTHER): Payer: Medicare Other | Admitting: Urology

## 2018-06-02 DIAGNOSIS — C675 Malignant neoplasm of bladder neck: Secondary | ICD-10-CM

## 2018-06-29 ENCOUNTER — Ambulatory Visit (INDEPENDENT_AMBULATORY_CARE_PROVIDER_SITE_OTHER): Payer: Medicare Other | Admitting: Nurse Practitioner

## 2018-09-22 ENCOUNTER — Other Ambulatory Visit (INDEPENDENT_AMBULATORY_CARE_PROVIDER_SITE_OTHER): Payer: Self-pay | Admitting: Internal Medicine

## 2018-10-20 ENCOUNTER — Other Ambulatory Visit (INDEPENDENT_AMBULATORY_CARE_PROVIDER_SITE_OTHER): Payer: Self-pay | Admitting: Internal Medicine

## 2018-10-26 ENCOUNTER — Ambulatory Visit (INDEPENDENT_AMBULATORY_CARE_PROVIDER_SITE_OTHER): Payer: Medicare Other | Admitting: Internal Medicine

## 2018-10-26 ENCOUNTER — Other Ambulatory Visit: Payer: Self-pay

## 2018-10-26 ENCOUNTER — Encounter (INDEPENDENT_AMBULATORY_CARE_PROVIDER_SITE_OTHER): Payer: Self-pay | Admitting: Internal Medicine

## 2018-10-26 VITALS — BP 110/67 | HR 69 | Temp 97.9°F | Resp 18 | Ht 63.0 in | Wt 123.6 lb

## 2018-10-26 DIAGNOSIS — F411 Generalized anxiety disorder: Secondary | ICD-10-CM | POA: Diagnosis not present

## 2018-10-26 DIAGNOSIS — E559 Vitamin D deficiency, unspecified: Secondary | ICD-10-CM

## 2018-10-26 DIAGNOSIS — B079 Viral wart, unspecified: Secondary | ICD-10-CM | POA: Diagnosis not present

## 2018-10-26 DIAGNOSIS — I10 Essential (primary) hypertension: Secondary | ICD-10-CM

## 2018-10-26 DIAGNOSIS — E782 Mixed hyperlipidemia: Secondary | ICD-10-CM

## 2018-10-26 DIAGNOSIS — E785 Hyperlipidemia, unspecified: Secondary | ICD-10-CM

## 2018-10-26 HISTORY — DX: Vitamin D deficiency, unspecified: E55.9

## 2018-10-26 HISTORY — DX: Viral wart, unspecified: B07.9

## 2018-10-26 HISTORY — DX: Hyperlipidemia, unspecified: E78.5

## 2018-10-26 NOTE — Progress Notes (Signed)
Wellness Office Visit  Subjective:  Patient ID: Jackie Phelps, female    DOB: [birth year]  Age: 85 y.o. MRN: [id number]  CC: This lady comes in for follow-up of hypertension, hyperlipidemia, impaired memory, vitamin D deficiency. HPI  Today, her daughter who was present with her, told me that she has been seeing a dermatologist at [facility] and has viral warts on both her arms which have been treated with cryotherapy but the last time she was seen was few months ago and the warts have not improved.  She would like to see if I can help her with this. She continues on antihypertensive medication for hypertension.  She denies any chest pain, dyspnea, palpitations or limb weakness. She continues on statin therapy for her hyperlipidemia.  She denies any myalgias. Past Medical History:  Diagnosis Date  . Anxiety   . Complication of anesthesia    Has increased BP after surgery.  . Constipation   . HLD (hyperlipidemia) 10/26/2018  . Hypercholesteremia   . Hypertension   . Impaired memory    due to severe sleep apnea,on CPAP now;stopped breathing 53.9 times per hour previously on study.  . Sleep apnea   . Viral warts 10/26/2018  . Vitamin D deficiency disease 10/26/2018      Family History  Problem Relation Age of Onset  . Hypertension Mother   . Healthy Daughter   . Colon cancer Neg Hx     Social History   Social History Narrative   Married 60 years.  Lives with husband.  Non-smoker.     Current Meds  Medication Sig  . amLODipine (NORVASC) 5 MG tablet Take 1 tablet by mouth daily.  . carvedilol (COREG) 6.25 MG tablet TAKE TWO TABLETS BY MOUTH TWICE DAILY  . donepezil (ARICEPT) 10 MG tablet Take 10 mg by mouth daily.  Marland Kitchen escitalopram (LEXAPRO) 5 MG tablet TAKE 1 TABLET BY MOUTH EVERY EVENING  . hydrochlorothiazide (HYDRODIURIL) 12.5 MG tablet TAKE 1 TABLET BY MOUTH EVERY DAY  . memantine (NAMENDA) 10 MG tablet Take 10 mg by mouth 2 (two) times daily.   . multivitamin-lutein  (OCUVITE-LUTEIN) CAPS capsule Take 1 capsule by mouth daily.  . SB LOW DOSE ASA EC 81 MG EC tablet Take 81 mg by mouth daily.  . simvastatin (ZOCOR) 20 MG tablet TAKE 1 TABLET BY MOUTH EVERY DAY     Objective:   Today's Vitals: BP 110/67 (BP Location: Right Arm, Patient Position: Sitting, Cuff Size: Small)   Pulse 69   Temp 97.9 F (36.6 C) (Temporal)   Resp 18   Ht 5\' 3"  (1.6 m)   Wt 123 lb 9.6 oz (56.1 kg)   SpO2 98% Comment: room air with mask on.  BMI 21.89 kg/m  Vitals with BMI 10/26/2018 12/29/2016 12/29/2016  Height 5\' 3"  - 5\' 4"   Weight 123 lbs 10 oz - 120 lbs  BMI [id number] - [id number]  Systolic A999333 0000000 A999333  Diastolic 67 73 74  Pulse 69 61 64     Physical Exam   She looks systemically well.  Blood pressure is well controlled.  Weight is stable.  I can see to benign-appearing viral warts one on the left elbow area posteriorly and the other on the right posterior forearm, each measuring approximately 1 cm in diameter.    Assessment   1. Essential hypertension   2. Anxiety state   3. Vitamin D deficiency disease   4. Viral warts, unspecified type   5. Mixed hyperlipidemia  Tests ordered Orders Placed This Encounter  Procedures  . COMPLETE METABOLIC PANEL WITH GFR     Plan: 1. She will continue with antihypertensive medications which are keeping her blood pressure controlled well. 2. She will continue with vitamin D3 supplementation for vitamin D deficiency. 3. She will continue with statin therapy for hyperlipidemia. 4. I treated her to viral warts with cryotherapy today. 5. Blood work is obtained above and she was given influenza vaccination today. 6. Further recommendations will depend on blood results and I will see her in about a month's time for repeat treatment of viral warts.   No orders of the defined types were placed in this encounter.   Doree Albee, MD

## 2018-10-27 LAB — COMPLETE METABOLIC PANEL WITH GFR
AG Ratio: 2.1 (calc) (ref 1.0–2.5)
ALT: 9 U/L (ref 6–29)
AST: 15 U/L (ref 10–35)
Albumin: 3.9 g/dL (ref 3.6–5.1)
Alkaline phosphatase (APISO): 53 U/L (ref 37–153)
BUN/Creatinine Ratio: 21 (calc) (ref 6–22)
BUN: 20 mg/dL (ref 7–25)
CO2: 30 mmol/L (ref 20–32)
Calcium: 9.6 mg/dL (ref 8.6–10.4)
Chloride: 104 mmol/L (ref 98–110)
Creat: 0.94 mg/dL — ABNORMAL HIGH (ref 0.60–0.88)
GFR, Est African American: 64 mL/min/{1.73_m2} (ref >=60)
GFR, Est Non African American: 55 mL/min/{1.73_m2} — ABNORMAL LOW (ref >=60)
Globulin: 1.9 g/dL (calc) (ref 1.9–3.7)
Glucose, Bld: 97 mg/dL (ref 65–99)
Potassium: 3.7 mmol/L (ref 3.5–5.3)
Sodium: 142 mmol/L (ref 135–146)
Total Bilirubin: 0.5 mg/dL (ref 0.2–1.2)
Total Protein: 5.8 g/dL — ABNORMAL LOW (ref 6.1–8.1)

## 2018-11-18 ENCOUNTER — Other Ambulatory Visit (INDEPENDENT_AMBULATORY_CARE_PROVIDER_SITE_OTHER): Payer: Self-pay | Admitting: Internal Medicine

## 2018-12-14 ENCOUNTER — Ambulatory Visit (INDEPENDENT_AMBULATORY_CARE_PROVIDER_SITE_OTHER): Payer: Medicare Other | Admitting: Internal Medicine

## 2018-12-17 ENCOUNTER — Ambulatory Visit (INDEPENDENT_AMBULATORY_CARE_PROVIDER_SITE_OTHER): Payer: Medicare Other | Admitting: Nurse Practitioner

## 2018-12-20 ENCOUNTER — Other Ambulatory Visit (INDEPENDENT_AMBULATORY_CARE_PROVIDER_SITE_OTHER): Payer: Self-pay | Admitting: Internal Medicine

## 2019-01-12 ENCOUNTER — Encounter (INDEPENDENT_AMBULATORY_CARE_PROVIDER_SITE_OTHER): Payer: Self-pay | Admitting: Internal Medicine

## 2019-01-12 ENCOUNTER — Ambulatory Visit (INDEPENDENT_AMBULATORY_CARE_PROVIDER_SITE_OTHER): Payer: Medicare Other | Admitting: Internal Medicine

## 2019-01-12 ENCOUNTER — Other Ambulatory Visit: Payer: Self-pay

## 2019-01-12 VITALS — BP 125/67 | HR 68 | Temp 97.6°F | Resp 18 | Ht 65.0 in | Wt 126.0 lb

## 2019-01-12 DIAGNOSIS — E782 Mixed hyperlipidemia: Secondary | ICD-10-CM

## 2019-01-12 DIAGNOSIS — I10 Essential (primary) hypertension: Secondary | ICD-10-CM | POA: Diagnosis not present

## 2019-01-12 DIAGNOSIS — E559 Vitamin D deficiency, unspecified: Secondary | ICD-10-CM

## 2019-01-12 NOTE — Progress Notes (Signed)
Metrics: Intervention Frequency ACO  Documented Smoking Status Yearly  Screened one or more times in 24 months  Cessation Counseling or  Active cessation medication Past 24 months  Past 24 months   Guideline developer: UpToDate (See UpToDate for funding source) Date Released: 2014       Wellness Office Visit  Subjective:  Patient ID: Jackie Phelps, female    DOB: 1931-03-19  Age: 86 y.o. MRN: [id number]  CC: This lady comes in for follow-up of hypertension, hyperlipidemia, vitamin D deficiency and memory loss. HPI  She continues with all medications for hyperlipidemia.  No myalgias. She continues with antihypertensive therapy.  No chest pain, dyspnea or palpitations.  Past Medical History:  Diagnosis Date  . Anxiety   . Complication of anesthesia    Has increased BP after surgery.  . Constipation   . HLD (hyperlipidemia) 10/26/2018  . Hypercholesteremia   . Hypertension   . Impaired memory    due to severe sleep apnea,on CPAP now;stopped breathing 53.9 times per hour previously on study.  . Sleep apnea   . Viral warts 10/26/2018  . Vitamin D deficiency disease 10/26/2018      Family History  Problem Relation Age of Onset  . Hypertension Mother   . Healthy Daughter   . Colon cancer Neg Hx     Social History   Social History Narrative   Married 60 years.  Lives with husband.  Non-smoker.   Social History   Tobacco Use  . Smoking status: Former Smoker    Packs/day: 0.25    Years: 4.00    Pack years: 1.00    Types: Cigarettes    Quit date: 02/02/1966    Years since quitting: 52.9  . Smokeless tobacco: Never Used  Substance Use Topics  . Alcohol use: Yes    Alcohol/week: 2.0 standard drinks    Types: 2 Glasses of wine per week    Comment: Drinks wine    Current Meds  Medication Sig  . amLODipine (NORVASC) 5 MG tablet Take 1 tablet by mouth daily.  . carvedilol (COREG) 6.25 MG tablet TAKE TWO TABLETS BY MOUTH TWICE DAILY  . donepezil (ARICEPT) 10 MG  tablet Take 10 mg by mouth daily.  Marland Kitchen escitalopram (LEXAPRO) 5 MG tablet TAKE 1 TABLET BY MOUTH EVERY EVENING  . hydrochlorothiazide (HYDRODIURIL) 12.5 MG tablet TAKE 1 TABLET BY MOUTH EVERY DAY  . memantine (NAMENDA) 10 MG tablet Take 10 mg by mouth 2 (two) times daily.   . multivitamin-lutein (OCUVITE-LUTEIN) CAPS capsule Take 1 capsule by mouth daily.  . SB LOW DOSE ASA EC 81 MG EC tablet Take 81 mg by mouth daily.  . simvastatin (ZOCOR) 20 MG tablet TAKE 1 TABLET BY MOUTH EVERY DAY    This patient is being treated with desiccated thyroid, off label, for symptoms of thyroid deficiency.  The patient has been counseled regarding side effects and how to deal with them.  Objective:   Today's Vitals: BP 125/67 (BP Location: Left Arm, Patient Position: Sitting, Cuff Size: Normal)   Pulse 68   Temp 97.6 F (36.4 C) (Temporal)   Resp 18   Ht '5\' 5"'$  (1.651 m)   Wt 126 lb (57.2 kg)   SpO2 98%   BMI 20.97 kg/m  Vitals with BMI 01/12/2019 10/26/2018 12/29/2016  Height '5\' 5"'$  '5\' 3"'$  -  Weight 126 lbs 123 lbs 10 oz -  BMI 48.18 56.3 -  Systolic 149 702 637  Diastolic 67 67 73  Pulse  68 69 61     Physical Exam  She looks systemically well.  Blood pressure is extremely well controlled.  She is alert and orientated.  No focal neurological signs     Assessment   1. Essential hypertension   2. Vitamin D deficiency disease   3. Mixed hyperlipidemia       Tests ordered Orders Placed This Encounter  Procedures  . CBC  . CMP with eGFR([facility])  . Lipid Panel  . T3, Free  . TSH  . Vitamin D, 25-hydroxy     Plan: 1. She will continue with antihypertensive therapy which is continuing to keep her blood pressure under good control. 2. She will continue vitamin D3 supplementation for vitamin D D deficiency. 3. She will continue with statin therapy and we will check levels today. 4. Further recommendations will depend on blood results and I will see her in about 3 months time for an  annual physical exam   No orders of the defined types were placed in this encounter.   Doree Albee, MD

## 2019-01-13 LAB — COMPLETE METABOLIC PANEL WITH GFR
AG Ratio: 2 (calc) (ref 1.0–2.5)
ALT: 13 U/L (ref 6–29)
AST: 18 U/L (ref 10–35)
Albumin: 4.2 g/dL (ref 3.6–5.1)
Alkaline phosphatase (APISO): 57 U/L (ref 37–153)
BUN/Creatinine Ratio: 17 (calc) (ref 6–22)
BUN: 18 mg/dL (ref 7–25)
CO2: 23 mmol/L (ref 20–32)
Calcium: 9.9 mg/dL (ref 8.6–10.4)
Chloride: 104 mmol/L (ref 98–110)
Creat: 1.06 mg/dL — ABNORMAL HIGH (ref 0.60–0.88)
GFR, Est African American: 55 mL/min/{1.73_m2} — ABNORMAL LOW (ref >=60)
GFR, Est Non African American: 48 mL/min/{1.73_m2} — ABNORMAL LOW (ref >=60)
Globulin: 2.1 g/dL (calc) (ref 1.9–3.7)
Glucose, Bld: 83 mg/dL (ref 65–99)
Potassium: 4 mmol/L (ref 3.5–5.3)
Sodium: 140 mmol/L (ref 135–146)
Total Bilirubin: 0.6 mg/dL (ref 0.2–1.2)
Total Protein: 6.3 g/dL (ref 6.1–8.1)

## 2019-01-13 LAB — CBC
HCT: 40.3 % (ref 35.0–45.0)
Hemoglobin: 13.5 g/dL (ref 11.7–15.5)
MCH: 30.3 pg (ref 27.0–33.0)
MCHC: 33.5 g/dL (ref 32.0–36.0)
MCV: 90.4 fL (ref 80.0–100.0)
MPV: 11.2 fL (ref 7.5–12.5)
Platelets: 192 10*3/uL (ref 140–400)
RBC: 4.46 10*6/uL (ref 3.80–5.10)
RDW: 11.8 % (ref 11.0–15.0)
WBC: 7 10*3/uL (ref 3.8–10.8)

## 2019-01-13 LAB — LIPID PANEL
Cholesterol: 216 mg/dL — ABNORMAL HIGH (ref <200)
HDL: 57 mg/dL (ref >=50)
LDL Cholesterol (Calc): 120 mg/dL (calc) — ABNORMAL HIGH
Non-HDL Cholesterol (Calc): 159 mg/dL (calc) — ABNORMAL HIGH (ref <130)
Total CHOL/HDL Ratio: 3.8 (calc) (ref <5.0)
Triglycerides: 273 mg/dL — ABNORMAL HIGH (ref <150)

## 2019-01-13 LAB — T3, FREE: T3, Free: 4.8 pg/mL — ABNORMAL HIGH (ref 2.3–4.2)

## 2019-01-13 LAB — VITAMIN D 25 HYDROXY (VIT D DEFICIENCY, FRACTURES): Vit D, 25-Hydroxy: 88 ng/mL (ref 30–100)

## 2019-01-13 LAB — TSH: TSH: 0.14 mIU/L — ABNORMAL LOW (ref 0.40–4.50)

## 2019-01-17 DIAGNOSIS — Z1231 Encounter for screening mammogram for malignant neoplasm of breast: Secondary | ICD-10-CM | POA: Diagnosis not present

## 2019-01-23 ENCOUNTER — Other Ambulatory Visit (INDEPENDENT_AMBULATORY_CARE_PROVIDER_SITE_OTHER): Payer: Self-pay | Admitting: Internal Medicine

## 2019-01-25 DIAGNOSIS — Z79899 Other long term (current) drug therapy: Secondary | ICD-10-CM | POA: Diagnosis not present

## 2019-01-25 DIAGNOSIS — G4733 Obstructive sleep apnea (adult) (pediatric): Secondary | ICD-10-CM | POA: Diagnosis not present

## 2019-01-25 DIAGNOSIS — I10 Essential (primary) hypertension: Secondary | ICD-10-CM | POA: Diagnosis not present

## 2019-01-25 DIAGNOSIS — G301 Alzheimer's disease with late onset: Secondary | ICD-10-CM | POA: Diagnosis not present

## 2019-01-26 ENCOUNTER — Encounter (INDEPENDENT_AMBULATORY_CARE_PROVIDER_SITE_OTHER): Payer: Medicare Other | Admitting: Nurse Practitioner

## 2019-01-26 ENCOUNTER — Ambulatory Visit (INDEPENDENT_AMBULATORY_CARE_PROVIDER_SITE_OTHER): Payer: Medicare Other | Admitting: Nurse Practitioner

## 2019-01-28 ENCOUNTER — Encounter (INDEPENDENT_AMBULATORY_CARE_PROVIDER_SITE_OTHER): Payer: Self-pay | Admitting: Nurse Practitioner

## 2019-01-28 ENCOUNTER — Ambulatory Visit (INDEPENDENT_AMBULATORY_CARE_PROVIDER_SITE_OTHER): Payer: Medicare Other | Admitting: Nurse Practitioner

## 2019-01-28 ENCOUNTER — Other Ambulatory Visit: Payer: Self-pay

## 2019-01-28 DIAGNOSIS — Z0000 Encounter for general adult medical examination without abnormal findings: Secondary | ICD-10-CM

## 2019-01-28 NOTE — Progress Notes (Signed)
Due to national recommendations of social distancing related to the [place] pandemic, an audio/visual tele-health visit was felt to be the most appropriate encounter type for this patient today. I connected with  Jackie Phelps on 01/28/19 utilizing audio/visual technology and verified that I am speaking with the correct person using two identifiers. The patient was located at their home, and I was located at the office of [facility] during the encounter. I discussed the limitations of evaluation and management by telemedicine. The patient expressed understanding and agreed to proceed.       Subjective:   Jackie Phelps is a 86 y.o. female who presents for Medicare Annual (Subsequent) preventive examination.   Cardiac Risk Factors include: advanced age (>78men, >51 women)     Objective:     Vitals: There were no vitals taken for this visit.  There is no height or weight on file to calculate BMI.  Advanced Directives 01/28/2019 12/29/2016 11/24/2016 08/14/2016 08/11/2016 08/06/2016 11/06/2015  Does Patient Have a Medical Advance Directive? Yes No No No;Yes No Yes Yes  Type of Advance Directive Out of facility DNR (pink MOST or yellow form) - - Press photographer;Living will - [place];Living will Living will  Does patient want to make changes to medical advance directive? - - - - - - -  Copy of [place] in Chart? - - - - - No - copy requested No - copy requested  Would patient like information on creating a medical advance directive? - - No - Patient declined - No - Patient declined - -  Pre-existing out of facility DNR order (yellow form or pink MOST form) - - - - - - -    Tobacco Social History   Tobacco Use  Smoking Status Former Smoker  . Packs/day: 0.25  . Years: 4.00  . Pack years: 1.00  . Types: Cigarettes  . Quit date: 02/02/1966  . Years since quitting: 53.0  Smokeless Tobacco Never Used     Counseling  given: Not Answered   Clinical Intake:  Pre-visit preparation completed: Yes  Pain : No/denies pain     Nutritional Risks: None Diabetes: No  How often do you need to have someone help you when you read instructions, pamphlets, or other written materials from your doctor or pharmacy?: 1 - Never What is the last grade level you completed in school?: 4-year degree  Interpreter Needed?: No  Information entered by :: Jeralyn Ruths, NP-C  Past Medical History:  Diagnosis Date  . Anxiety   . Complication of anesthesia    Has increased BP after surgery.  . Constipation   . HLD (hyperlipidemia) 10/26/2018  . Hypercholesteremia   . Hypertension   . Impaired memory    due to severe sleep apnea,on CPAP now;stopped breathing 53.9 times per hour previously on study.  . Sleep apnea   . Viral warts 10/26/2018  . Vitamin D deficiency disease 10/26/2018   Past Surgical History:  Procedure Laterality Date  . CATARACT EXTRACTION, BILATERAL    . COLONOSCOPY    . COLONOSCOPY  03/05/2011   Procedure: COLONOSCOPY;  Surgeon: Rogene Houston, MD;  [facility];  Service: Endoscopy;  Laterality: N/A;  1200  . CYSTOSCOPY W/ RETROGRADES Bilateral 08/11/2016   Procedure: CYSTOSCOPY WITH BILATERAL RETROGRADE PYELOGRAM;  Surgeon: Cleon Gustin, MD;  Location: [facility];  Service: Urology;  Laterality: Bilateral;  . HIP ARTHROPLASTY Left 01/08/2014   Procedure: ARTHROPLASTY  BIPOLAR HIP;  Surgeon: Marianna Payment, MD;  Location: [place];  Service: Orthopedics;  Laterality: Left;  . TRANSURETHRAL RESECTION OF BLADDER TUMOR N/A 08/11/2016   Procedure: TRANSURETHRAL RESECTION OF BLADDER TUMOR (TURBT);  Surgeon: Cleon Gustin, MD;  Location: [facility];  Service: Urology;  Laterality: N/A;  . TRIGGER FINGER RELEASE Bilateral    ring and middle fingers.   Family History  Problem Relation Age of Onset  . Hypertension Mother   . Healthy Daughter   . Colon cancer Neg Hx    Social History    Socioeconomic History  . Marital status: Married    Spouse name: Not on file  . Number of children: Not on file  . Years of education: Not on file  . Highest education level: Not on file  Occupational History  . Not on file  Tobacco Use  . Smoking status: Former Smoker    Packs/day: 0.25    Years: 4.00    Pack years: 1.00    Types: Cigarettes    Quit date: 02/02/1966    Years since quitting: 53.0  . Smokeless tobacco: Never Used  Substance and Sexual Activity  . Alcohol use: Yes    Alcohol/week: 2.0 standard drinks    Types: 2 Glasses of wine per week    Comment: Drinks wine  . Drug use: No  . Sexual activity: Not Currently    Birth control/protection: Post-menopausal  Other Topics Concern  . Not on file  Social History Narrative   Married 60 years.  Lives with husband.  Non-smoker.   Social Determinants of Health   Financial Resource Strain:   . Difficulty of Paying Living Expenses: Not on file  Food Insecurity:   . Worried About Charity fundraiser in the Last Year: Not on file  . Ran Out of Food in the Last Year: Not on file  Transportation Needs:   . Lack of Transportation (Medical): Not on file  . Lack of Transportation (Non-Medical): Not on file  Physical Activity:   . Days of Exercise per Week: Not on file  . Minutes of Exercise per Session: Not on file  Stress:   . Feeling of Stress : Not on file  Social Connections:   . Frequency of Communication with Friends and Family: Not on file  . Frequency of Social Gatherings with Friends and Family: Not on file  . Attends Religious Services: Not on file  . Active Member of Clubs or Organizations: Not on file  . Attends Archivist Meetings: Not on file  . Marital Status: Not on file    Outpatient Encounter Medications as of 01/28/2019  Medication Sig  . amLODipine (NORVASC) 5 MG tablet Take 1 tablet by mouth daily.  . carvedilol (COREG) 6.25 MG tablet TAKE TWO TABLETS BY MOUTH TWICE DAILY  . donepezil  (ARICEPT) 10 MG tablet Take 10 mg by mouth daily.  Marland Kitchen escitalopram (LEXAPRO) 5 MG tablet TAKE 1 TABLET BY MOUTH EVERY EVENING  . hydrochlorothiazide (HYDRODIURIL) 12.5 MG tablet TAKE 1 TABLET BY MOUTH EVERY DAY  . memantine (NAMENDA) 10 MG tablet Take 10 mg by mouth 2 (two) times daily.   . multivitamin-lutein (OCUVITE-LUTEIN) CAPS capsule Take 1 capsule by mouth daily.  . SB LOW DOSE ASA EC 81 MG EC tablet Take 81 mg by mouth daily.  . simvastatin (ZOCOR) 20 MG tablet TAKE 1 TABLET BY MOUTH EVERY DAY  . NP THYROID 60 MG tablet TAKE 1 TABLET BY MOUTH EVERY  DAY  . [DISCONTINUED] hydrochlorothiazide (HYDRODIURIL) 12.5 MG tablet TAKE 1 TABLET BY MOUTH EVERY DAY   No facility-administered encounter medications on file as of 01/28/2019.    Activities of Daily Living In your present state of health, do you have any difficulty performing the following activities: 01/28/2019  Hearing? N  Vision? N  Difficulty concentrating or making decisions? Y  Walking or climbing stairs? N  Dressing or bathing? N  Doing errands, shopping? Y  Preparing Food and eating ? Y  Using the Toilet? N  In the past six months, have you accidently leaked urine? N  Do you have problems with loss of bowel control? N  Managing your Medications? N  Managing your Finances? Y  Housekeeping or managing your Housekeeping? N  Some recent data might be hidden    Patient Care Team: Doree Albee, MD as PCP - General (Internal Medicine)    Assessment:   This is a routine wellness examination for Brynnlee.  Exercise Activities and Dietary recommendations Current Exercise Habits: Home exercise routine, Type of exercise: walking, Frequency (Times/Week): 7(in summer), Intensity: Mild, Exercise limited by: None identified  Goals   None     Fall Risk Fall Risk  01/28/2019 10/26/2018  Falls in the past year? 0 0  Number falls in past yr: 0 0  Injury with Fall? 0 0  Follow up Education provided;Falls prevention discussed  Falls evaluation completed   Is the patient's home free of loose throw rugs in walkways, pet beds, electrical cords, etc?   yes      Grab bars in the bathroom? yes      Handrails on the stairs?   yes      Adequate lighting?   yes  Timed Get Up and Go performed: Not performed visit was conducted remotely  Depression Screen PHQ 2/9 Scores 01/28/2019 10/26/2018  PHQ - 2 Score 0 0  Exception Documentation - Medical reason     Cognitive Function     6CIT Screen 01/28/2019  What Year? 0 points  What month? 0 points  What time? 0 points  Count back from 20 4 points  Months in reverse 2 points  Repeat phrase 10 points  Total Score 16    Immunization History  Administered Date(s) Administered  . Influenza-Unspecified 10/07/2018  . Pneumococcal Conjugate-13 10/17/2014  . Pneumococcal Polysaccharide-23 10/26/2017    Qualifies for Shingles Vaccine?  Yes  Screening Tests Health Maintenance  Topic Date Due  . TETANUS/TDAP  06/26/1950  . INFLUENZA VACCINE  Completed  . DEXA SCAN  Completed  . PNA vac Low Risk Adult  Completed    Cancer Screenings: Lung: Low Dose CT Chest recommended if Age 64-80 years, 30 pack-year currently smoking OR have quit w/in 15years. Patient does not qualify. Breast:  Up to date on Mammogram? Yes   Up to date of Bone Density/Dexa? Yes Colorectal: Not applicable  Additional Screenings: Hepatitis C Screening: Not applicable     Plan:   Patient is due for COVID-19 vaccination, tetanus, and shingles vaccine.  I encouraged her to discuss shingles vaccination with her pharmacist.  I told her that if she experiences any kind of penetrating wound that she would need to get the tetanus vaccine administered.  She will get the COVID-19 vaccine when it is available.  She is up-to-date with screenings recommended for a female of her age.  She tells me she had her mammogram completed on January 11 of 2021 and it was negative.  She  will be due for depression  screening again next year.  She will be due for fall screening again next year.  She will be due for bone density screening again in November of this year.  We discussed her most form and she does not want to make any changes to this at this time.  I have personally reviewed and noted the following in the patient's chart:   . Medical and social history . Use of alcohol, tobacco or illicit drugs  . Current medications and supplements . Functional ability and status . Nutritional status . Physical activity . Advanced directives . List of other physicians . Hospitalizations, surgeries, and ER visits in previous 12 months . Vitals . Screenings to include cognitive, depression, and falls . Referrals and appointments  In addition, I have reviewed and discussed with patient certain preventive protocols, quality metrics, and best practice recommendations. A written personalized care plan for preventive services as well as general preventive health recommendations were provided to patient.    She will follow-up as scheduled in 3 months for office visit.  Ailene Ards, NP  01/28/2019

## 2019-01-28 NOTE — Patient Instructions (Signed)
Thank you for choosing [facility] as your medical provider! If you have any questions or concerns regarding your health care, please do not hesitate to call our office.  Immunizations: 1.  COVID-19: Please get this administered when it is available 2.  Flu: You will be due for this next flu season 3.  Pneumonia: You have completed these vaccinations 4.  Tetanus: He is have this vaccine administered if you experience any kind of penetrating wound 5.  Shingles: If you are interested in having the shingles vaccine administered discuss this with your pharmacy  Screenings: 1.  Depression: You will be due for depression screening again next year 2.  Fall screening: You will be due for fall screening next year 3.  Osteoporosis: You will be due for bone density scanning again in November of this year  Advanced care planning: We discussed your MOST form today.  Please follow-up as scheduled in 3 months. We look forward to seeing you again soon!   At [facility] we value your feedback. You may receive a survey about your visit today. Please share your experience as we strive to create trusting relationships with our patients to provide genuine, compassionate, quality care.  We appreciate your understanding and patience as we review any laboratory studies, imaging, and other diagnostic tests that are ordered as we care for you. We do our best to address any and all results in a timely manner. If you do not hear about test results within 1 week, please do not hesitate to contact us. If we referred you to a specialist during your visit or ordered imaging testing, contact the office if you have not been contacted to be scheduled within 1 weeks.  We also encourage the use of MyChart, which contains your medical information for your review as well. If you are not enrolled in this feature, an access code is on this after visit summary for your convenience. Thank you for allowing [place] to be  involved in your care.

## 2019-02-15 DIAGNOSIS — L570 Actinic keratosis: Secondary | ICD-10-CM | POA: Diagnosis not present

## 2019-02-15 DIAGNOSIS — L821 Other seborrheic keratosis: Secondary | ICD-10-CM | POA: Diagnosis not present

## 2019-02-15 DIAGNOSIS — Z8589 Personal history of malignant neoplasm of other organs and systems: Secondary | ICD-10-CM | POA: Diagnosis not present

## 2019-02-28 ENCOUNTER — Other Ambulatory Visit (INDEPENDENT_AMBULATORY_CARE_PROVIDER_SITE_OTHER): Payer: Self-pay | Admitting: Internal Medicine

## 2019-03-23 DIAGNOSIS — G4733 Obstructive sleep apnea (adult) (pediatric): Secondary | ICD-10-CM | POA: Diagnosis not present

## 2019-03-30 ENCOUNTER — Other Ambulatory Visit (INDEPENDENT_AMBULATORY_CARE_PROVIDER_SITE_OTHER): Payer: Self-pay | Admitting: Internal Medicine

## 2019-04-12 ENCOUNTER — Ambulatory Visit (INDEPENDENT_AMBULATORY_CARE_PROVIDER_SITE_OTHER): Payer: Medicare Other | Admitting: Internal Medicine

## 2019-04-12 ENCOUNTER — Other Ambulatory Visit: Payer: Self-pay

## 2019-04-12 ENCOUNTER — Encounter (INDEPENDENT_AMBULATORY_CARE_PROVIDER_SITE_OTHER): Payer: Self-pay | Admitting: Internal Medicine

## 2019-04-12 VITALS — BP 90/58 | HR 74 | Temp 97.6°F | Ht 62.0 in | Wt 127.2 lb

## 2019-04-12 DIAGNOSIS — I10 Essential (primary) hypertension: Secondary | ICD-10-CM | POA: Diagnosis not present

## 2019-04-12 DIAGNOSIS — E782 Mixed hyperlipidemia: Secondary | ICD-10-CM | POA: Diagnosis not present

## 2019-04-12 DIAGNOSIS — F0390 Unspecified dementia without behavioral disturbance: Secondary | ICD-10-CM | POA: Diagnosis not present

## 2019-04-12 NOTE — Progress Notes (Signed)
Metrics: Intervention Frequency ACO  Documented Smoking Status Yearly  Screened one or more times in 24 months  Cessation Counseling or  Active cessation medication Past 24 months  Past 24 months   Guideline developer: UpToDate (See UpToDate for funding source) Date Released: 2014       Wellness Office Visit  Subjective:  Patient ID: Jackie Phelps, female    DOB: 1931-01-20  Age: 86 y.o. MRN: [id number]  CC: This lady comes in for follow-up of dementia, hypertension, hyperlipidemia. HPI  She is doing well and appears to be her pleasant self.  She does not have any complaints.  She continues to take antihypertensive medication, statin therapy and desiccated thyroid for symptoms of thyroid deficiency. Past Medical History:  Diagnosis Date  . Anxiety   . Complication of anesthesia    Has increased BP after surgery.  . Constipation   . HLD (hyperlipidemia) 10/26/2018  . Hypercholesteremia   . Hypertension   . Impaired memory    due to severe sleep apnea,on CPAP now;stopped breathing 53.9 times per hour previously on study.  . Sleep apnea   . Viral warts 10/26/2018  . Vitamin D deficiency disease 10/26/2018      Family History  Problem Relation Age of Onset  . Hypertension Mother   . Healthy Daughter   . Colon cancer Neg Hx     Social History   Social History Narrative   Married 60 years.  Lives with husband.  Non-smoker.   Social History   Tobacco Use  . Smoking status: Former Smoker    Packs/day: 0.25    Years: 4.00    Pack years: 1.00    Types: Cigarettes    Quit date: 02/02/1966    Years since quitting: 53.2  . Smokeless tobacco: Never Used  Substance Use Topics  . Alcohol use: Yes    Alcohol/week: 2.0 standard drinks    Types: 2 Glasses of wine per week    Comment: Drinks wine    Current Meds  Medication Sig  . amLODipine (NORVASC) 5 MG tablet Take 1 tablet by mouth daily.  . carvedilol (COREG) 6.25 MG tablet TAKE TWO TABLETS BY MOUTH TWICE DAILY   . donepezil (ARICEPT) 10 MG tablet Take 10 mg by mouth daily.  Marland Kitchen escitalopram (LEXAPRO) 5 MG tablet TAKE 1 TABLET BY MOUTH EVERY EVENING  . hydrochlorothiazide (HYDRODIURIL) 12.5 MG tablet TAKE 1 TABLET BY MOUTH EVERY DAY  . memantine (NAMENDA) 10 MG tablet Take 10 mg by mouth 2 (two) times daily.   . multivitamin-lutein (OCUVITE-LUTEIN) CAPS capsule Take 1 capsule by mouth daily.  . NP THYROID 60 MG tablet TAKE 1 TABLET BY MOUTH EVERY DAY  . SB LOW DOSE ASA EC 81 MG EC tablet Take 81 mg by mouth daily.  . simvastatin (ZOCOR) 20 MG tablet TAKE 1 TABLET BY MOUTH EVERY DAY       Objective:   Today's Vitals: BP (!) 90/58 (BP Location: Left Arm, Patient Position: Sitting, Cuff Size: Normal)   Pulse 74   Temp 97.6 F (36.4 C) (Temporal)   Ht 5\' 2"  (1.575 m)   Wt 127 lb 3.2 oz (57.7 kg)   SpO2 96%   BMI 23.27 kg/m  Vitals with BMI 04/12/2019 01/28/2019 01/12/2019  Height 5\' 2"  - 5\' 5"   Weight 127 lbs 3 oz - 126 lbs  BMI [id number] - [id number]  Systolic 90 (No Data) 0000000  Diastolic 58 (No Data) 67  Pulse 74 - 68  Physical Exam    She looks systemically well.  Blood pressure is excellent.  She is cheerful.   Assessment   1. Essential hypertension   2. Mixed hyperlipidemia   3. Dementia without behavioral disturbance, unspecified dementia type ([place])       Tests ordered Orders Placed This Encounter  Procedures  . COMPLETE METABOLIC PANEL WITH GFR     Plan: 1. We will check electrolytes today. 2. She will continue with antihypertensive therapy and this seems to be controlling her blood pressure very well.  We may consider discontinuing medication at some point. 3. She will continue with statin therapy which she is tolerating well. 4. Further recommendations will depend on blood results and I will have her follow-up with Sarah in about 3 months time.   No orders of the defined types were placed in this encounter.   Doree Albee, MD

## 2019-04-13 LAB — COMPLETE METABOLIC PANEL WITH GFR
AG Ratio: 2.4 (calc) (ref 1.0–2.5)
ALT: 11 U/L (ref 6–29)
AST: 15 U/L (ref 10–35)
Albumin: 4.1 g/dL (ref 3.6–5.1)
Alkaline phosphatase (APISO): 50 U/L (ref 37–153)
BUN/Creatinine Ratio: 20 (calc) (ref 6–22)
BUN: 19 mg/dL (ref 7–25)
CO2: 29 mmol/L (ref 20–32)
Calcium: 9.6 mg/dL (ref 8.6–10.4)
Chloride: 104 mmol/L (ref 98–110)
Creat: 0.96 mg/dL — ABNORMAL HIGH (ref 0.60–0.88)
GFR, Est African American: 63 mL/min/{1.73_m2} (ref >=60)
GFR, Est Non African American: 54 mL/min/{1.73_m2} — ABNORMAL LOW (ref >=60)
Globulin: 1.7 g/dL (calc) — ABNORMAL LOW (ref 1.9–3.7)
Glucose, Bld: 185 mg/dL — ABNORMAL HIGH (ref 65–99)
Potassium: 3.8 mmol/L (ref 3.5–5.3)
Sodium: 141 mmol/L (ref 135–146)
Total Bilirubin: 0.7 mg/dL (ref 0.2–1.2)
Total Protein: 5.8 g/dL — ABNORMAL LOW (ref 6.1–8.1)

## 2019-04-18 DIAGNOSIS — G4733 Obstructive sleep apnea (adult) (pediatric): Secondary | ICD-10-CM | POA: Diagnosis not present

## 2019-04-18 DIAGNOSIS — I10 Essential (primary) hypertension: Secondary | ICD-10-CM | POA: Diagnosis not present

## 2019-04-18 DIAGNOSIS — Z79899 Other long term (current) drug therapy: Secondary | ICD-10-CM | POA: Diagnosis not present

## 2019-04-19 ENCOUNTER — Other Ambulatory Visit (INDEPENDENT_AMBULATORY_CARE_PROVIDER_SITE_OTHER): Payer: Self-pay | Admitting: Internal Medicine

## 2019-04-21 ENCOUNTER — Telehealth: Payer: Self-pay | Admitting: Urology

## 2019-04-21 NOTE — Telephone Encounter (Signed)
Pt's daughter called and asked if she needed her appt she was following up every 6 months. If not we will go ahead and cancel.

## 2019-04-22 NOTE — Telephone Encounter (Signed)
Daughter notified of appt being 1 year f/u cysto. Pt will keep scheduled appt.

## 2019-05-25 ENCOUNTER — Ambulatory Visit (INDEPENDENT_AMBULATORY_CARE_PROVIDER_SITE_OTHER): Payer: Medicare Other | Admitting: Urology

## 2019-05-25 ENCOUNTER — Other Ambulatory Visit: Payer: Self-pay

## 2019-05-25 ENCOUNTER — Encounter: Payer: Self-pay | Admitting: Urology

## 2019-05-25 VITALS — BP 162/76 | HR 57 | Temp 93.9°F | Ht 64.0 in | Wt 118.0 lb

## 2019-05-25 DIAGNOSIS — C679 Malignant neoplasm of bladder, unspecified: Secondary | ICD-10-CM | POA: Diagnosis not present

## 2019-05-25 LAB — POCT URINALYSIS DIPSTICK
Bilirubin, UA: NEGATIVE
Glucose, UA: NEGATIVE
Ketones, UA: NEGATIVE
Leukocytes, UA: NEGATIVE
Nitrite, UA: NEGATIVE
Protein, UA: NEGATIVE
Spec Grav, UA: 1.01 (ref 1.010–1.025)
Urobilinogen, UA: NEGATIVE E.U./dL — AB
pH, UA: 5 (ref 5.0–8.0)

## 2019-05-25 MED ORDER — CIPROFLOXACIN HCL 500 MG PO TABS
500.0000 mg | ORAL_TABLET | Freq: Once | ORAL | Status: AC
  Administered 2019-05-25: 500 mg via ORAL

## 2019-05-25 NOTE — Progress Notes (Signed)
   05/25/19  CC: bladder cancer  HPI: Jackie Phelps is an 86yo here for followup for bladder cancer.  Here records from [place] are as follows: <HTML><META HTTP-EQUIV="content-type" CONTENT="text/html;charset=utf-8"><B>08/20/2016: Pathology showed TaG1 at the bladder neck. <BR><BR>11/26/2016: No hematuria or dysuria. <BR><BR>12/03/2016: Pt finished here antibiotics for UTI. No more hematuria <BR><BR>06/10/2017: NO hematuria or dysuria <BR><BR>06/02/2018: No hematuria or dysuria since last visit. UA today shows 2+ blood.</B>  Blood pressure (!) 162/76, pulse (!) 57, temperature (!) 93.9 F (34.4 C), height 5\' 4"  (1.626 m), weight 118 lb (53.5 kg). NED. A&Ox3.   No respiratory distress   Abd soft, NT, ND Normal external genitalia with patent urethral meatus  Cystoscopy Procedure Note  Patient identification was confirmed, informed consent was obtained, and patient was prepped using Betadine solution.  Lidocaine jelly was administered per urethral meatus.    Procedure: - Flexible cystoscope introduced, without any difficulty.   - Thorough search of the bladder revealed:    normal urethral meatus    normal urothelium    no stones    no ulcers     no tumors    no urethral polyps    no trabeculation  - Ureteral orifices were normal in position and appearance.  Post-Procedure: - Patient tolerated the procedure well  Assessment/ Plan: Hx of Bladder Cancer  RTC 1 year for cystoscopy    No follow-ups on file.  [name], MD

## 2019-05-25 NOTE — Addendum Note (Signed)
Addended by: Valentina Lucks on: 05/25/2019 04:32 PM   Modules accepted: Orders

## 2019-05-25 NOTE — Progress Notes (Signed)

## 2019-05-25 NOTE — Patient Instructions (Signed)
Bladder Cancer  Bladder cancer is an abnormal growth of tissue in the bladder. The bladder is the balloon-like sac in the pelvis. It collects and stores urine that comes from the kidneys through the ureters. The bladder wall is made of layers. If cancer spreads into these layers and through the wall of the bladder, it becomes more difficult to treat. What are the causes? The cause of this condition is not known. What increases the risk? The following factors may make you more likely to develop this condition:  Smoking.  Workplace risks (occupational exposures), such as rubber, leather, textile, dyes, chemicals, and paint.  Being white.  Your age. Most people with bladder cancer are over the age of 55.  Being female.  Having chronic bladder inflammation.  Having a personal history of bladder cancer.  Having a family history of bladder cancer (heredity).  Having had chemotherapy or radiation therapy to the pelvis.  Having been exposed to arsenic. What are the signs or symptoms? Initial symptoms of this condition include:  Blood in the urine.  Painful urination.  Frequent bladder or urine infections.  Increase in urgency and frequency of urination. Advanced symptoms of this condition include:  Not being able to urinate.  Low back pain on one side.  Loss of appetite.  Weight loss.  Fatigue.  Swelling in the feet.  Bone pain. How is this diagnosed? This condition is diagnosed based on your medical history, a physical exam, urine tests, lab tests, imaging tests, and your symptoms. You may also have other tests or procedures done, such as:  A narrow tube being inserted into your bladder through your urethra (cystoscopy) in order to view the lining of your bladder for tumors.  A biopsy to sample the tumor to see if cancer is present. If cancer is present, it will then be staged to determine its severity and extent. Staging is an assessment of:  The size of the  tumor.  Whether the cancer has spread.  Where the cancer has spread. It is important to know how deeply into the bladder wall cancer has grown and whether cancer has spread to any other parts of your body. Staging may require blood tests or imaging tests, such as a CT scan, MRI, bone scan, or chest X-ray. How is this treated? Based on the stage of cancer, one treatment or a combination of treatments may be recommended. The most common forms of treatment are:  Surgery to remove the cancer. Procedures that may be done include transurethral resection and cystectomy.  Radiation therapy. This is high-energy X-rays or other particles. This is often used in combination with chemotherapy.  Chemotherapy. During this treatment, medicines are used to kill cancer cells.  Immunotherapy. This uses medicines to help your own immune system destroy cancer cells. Follow these instructions at home:  Take over-the-counter and prescription medicines only as told by your health care provider.  Maintain a healthy diet. Some of your treatments might affect your appetite.  Consider joining a support group. This may help you learn to cope with the stress of having bladder cancer.  Tell your cancer care team if you develop side effects. They may be able to recommend ways to relieve them.  Keep all follow-up visits as told by your health care provider. This is important. Where to find more information  American Cancer Society: www.cancer.org  National Cancer Institute (NCI): www.cancer.gov Contact a health care provider if:  You have symptoms of a urinary tract infection. These include: ?   Fever. ? Chills. ? Weakness. ? Muscle aches. ? Abdominal pain. ? Frequent and intense urge to urinate. ? Burning feeling in the bladder or urethra during urination. Get help right away if:  There is blood in your urine.  You cannot urinate.  You have severe pain or other symptoms that do not go  away. Summary  Bladder cancer is an abnormal growth of tissue in the bladder.  This condition is diagnosed based on your medical history, a physical exam, urine tests, lab tests, imaging tests, and your symptoms.  Based on the stage of cancer, surgery, chemotherapy, or a combination of treatments may be recommended.  Consider joining a support group. This may help you learn to cope with the stress of having bladder cancer. This information is not intended to replace advice given to you by your health care provider. Make sure you discuss any questions you have with your health care provider. Document Revised: 12/05/2016 Document Reviewed: 11/27/2015 Elsevier Patient Education  2020 [employer] Inc.  

## 2019-06-20 ENCOUNTER — Other Ambulatory Visit (INDEPENDENT_AMBULATORY_CARE_PROVIDER_SITE_OTHER): Payer: Self-pay | Admitting: Nurse Practitioner

## 2019-06-20 ENCOUNTER — Other Ambulatory Visit (INDEPENDENT_AMBULATORY_CARE_PROVIDER_SITE_OTHER): Payer: Self-pay | Admitting: Internal Medicine

## 2019-07-18 ENCOUNTER — Encounter (INDEPENDENT_AMBULATORY_CARE_PROVIDER_SITE_OTHER): Payer: Self-pay | Admitting: Internal Medicine

## 2019-07-18 ENCOUNTER — Other Ambulatory Visit: Payer: Self-pay

## 2019-07-18 ENCOUNTER — Encounter (INDEPENDENT_AMBULATORY_CARE_PROVIDER_SITE_OTHER): Payer: Self-pay

## 2019-07-18 ENCOUNTER — Ambulatory Visit (INDEPENDENT_AMBULATORY_CARE_PROVIDER_SITE_OTHER): Payer: Medicare Other | Admitting: Internal Medicine

## 2019-07-18 VITALS — BP 120/60 | HR 85 | Temp 95.7°F | Ht 62.0 in | Wt 123.0 lb

## 2019-07-18 DIAGNOSIS — L03119 Cellulitis of unspecified part of limb: Secondary | ICD-10-CM

## 2019-07-18 DIAGNOSIS — L02519 Cutaneous abscess of unspecified hand: Secondary | ICD-10-CM | POA: Diagnosis not present

## 2019-07-18 MED ORDER — DEXAMETHASONE SODIUM PHOSPHATE 4 MG/ML IJ SOLN
4.0000 mg | Freq: Once | INTRAMUSCULAR | Status: AC
  Administered 2019-07-18: 4 mg via INTRAMUSCULAR

## 2019-07-18 MED ORDER — LEVOFLOXACIN 500 MG PO TABS: 500.0000 mg | ORAL_TABLET | Freq: Every day | ORAL | 0 refills | Status: DC

## 2019-07-18 NOTE — Addendum Note (Signed)
Addended by: Saintclair Halsted A on: 07/18/2019 02:02 PM   Modules accepted: Orders

## 2019-07-18 NOTE — Progress Notes (Signed)
Metrics: Intervention Frequency ACO  Documented Smoking Status Yearly  Screened one or more times in 24 months  Cessation Counseling or  Active cessation medication Past 24 months  Past 24 months   Guideline developer: UpToDate (See UpToDate for funding source) Date Released: 2014       Wellness Office Visit  Subjective:  Patient ID: Jackie Phelps, female    DOB: [birth year]  Age: 86 y.o. MRN: [id number]  CC: This is an acute visit with chief complaint of possible insect bite on the right hand. HPI  Unfortunately the patient cannot remember what happened but she thinks she may have been bitten by an insect on the dorsal aspect of the right hand sometime yesterday.  The daughter, who is present with her, is worried about a bee sting.  There was no swelling of the tongue or difficulty breathing but the dorsal aspect of the right hand appears to have swollen more and is red appearing. Past Medical History:  Diagnosis Date  . Anxiety   . Complication of anesthesia    Has increased BP after surgery.  . Constipation   . HLD (hyperlipidemia) 10/26/2018  . Hypercholesteremia   . Hypertension   . Impaired memory    due to severe sleep apnea,on CPAP now;stopped breathing 53.9 times per hour previously on study.  . Sleep apnea   . Viral warts 10/26/2018  . Vitamin D deficiency disease 10/26/2018   Past Surgical History:  Procedure Laterality Date  . CATARACT EXTRACTION, BILATERAL    . COLONOSCOPY    . COLONOSCOPY  03/05/2011   Procedure: COLONOSCOPY;  Surgeon: Rogene Houston, MD;  [facility];  Service: Endoscopy;  Laterality: N/A;  1200  . CYSTOSCOPY W/ RETROGRADES Bilateral 08/11/2016   Procedure: CYSTOSCOPY WITH BILATERAL RETROGRADE PYELOGRAM;  Surgeon: Cleon Gustin, MD;  Location: [facility];  Service: Urology;  Laterality: Bilateral;  . HIP ARTHROPLASTY Left 01/08/2014   Procedure: ARTHROPLASTY BIPOLAR HIP;  Surgeon: Marianna Payment, MD;  Location: [place];  Service:  Orthopedics;  Laterality: Left;  . TRANSURETHRAL RESECTION OF BLADDER TUMOR N/A 08/11/2016   Procedure: TRANSURETHRAL RESECTION OF BLADDER TUMOR (TURBT);  Surgeon: Cleon Gustin, MD;  Location[facility];  Service: Urology;  Laterality: N/A;  . TRIGGER FINGER RELEASE Bilateral    ring and middle fingers.     Family History  Problem Relation Age of Onset  . Hypertension Mother   . Healthy Daughter   . Colon cancer Neg Hx     Social History   Social History Narrative   Married 60 years.  Lives with husband.  Non-smoker.   Social History   Tobacco Use  . Smoking status: Former Smoker    Packs/day: 0.25    Years: 4.00    Pack years: 1.00    Types: Cigarettes    Quit date: 02/02/1966    Years since quitting: 53.4  . Smokeless tobacco: Never Used  Substance Use Topics  . Alcohol use: Yes    Alcohol/week: 2.0 standard drinks    Types: 2 Glasses of wine per week    Comment: Drinks wine    Current Meds  Medication Sig  . amLODipine (NORVASC) 5 MG tablet Take 1 tablet by mouth daily.  . carvedilol (COREG) 6.25 MG tablet TAKE TWO TABLETS BY MOUTH TWICE DAILY  . donepezil (ARICEPT) 10 MG tablet Take 10 mg by mouth daily.  Marland Kitchen escitalopram (LEXAPRO) 5 MG tablet TAKE 1 TABLET BY MOUTH EVERY EVENING  .  hydrochlorothiazide (HYDRODIURIL) 12.5 MG tablet TAKE 1 TABLET BY MOUTH EVERY DAY  . memantine (NAMENDA) 10 MG tablet Take 10 mg by mouth 2 (two) times daily.   . multivitamin-lutein (OCUVITE-LUTEIN) CAPS capsule Take 1 capsule by mouth daily.  . SB LOW DOSE ASA EC 81 MG EC tablet Take 81 mg by mouth daily.  . simvastatin (ZOCOR) 20 MG tablet TAKE 1 TABLET BY MOUTH EVERY DAY  . thyroid (NP THYROID) 60 MG tablet NP Thyroid 60 mg tablet  TAKE 1 TABLET BY MOUTH EVERY DAY     Depression screen [facility] 2/9 04/12/2019 01/28/2019 10/26/2018  Decreased Interest 0 0 0  Down, Depressed, Hopeless 0 0 0  PHQ - 2 Score 0 0 0     Objective:   Today's Vitals: BP 120/60 (BP Location: Left Arm,  Patient Position: Sitting, Cuff Size: Normal)   Pulse 85   Temp (!) 95.7 F (35.4 C) (Temporal)   Ht 5\' 2"  (1.575 m)   Wt 123 lb (55.8 kg)   SpO2 95%   BMI 22.50 kg/m  Vitals with BMI 07/18/2019 05/25/2019 04/12/2019  Height 5\' 2"  5\' 4"  5\' 2"   Weight 123 lbs 118 lbs 127 lbs 3 oz  BMI 22.49 51.76 16.07  Systolic 371 062 90  Diastolic 60 76 58  Pulse 85 57 74     Physical Exam   She looks systemically well.  There are no respiratory difficulties.  There is no lip or tongue swelling.  She does have a point of entry on the dorsal aspect of the right hand in the area of the wrist measuring approximately 1 cm and I suspect this is the port of entry or where the bite was.  Surrounding this area in a generalized fashion is some erythema.  It is slightly warm to the touch.    Assessment   1. Cellulitis and abscess of hand       Tests ordered No orders of the defined types were placed in this encounter.    Plan: 1. Although the findings seen on the dorsal aspect of the right hand may be just inflammatory in nature, with the point of entry and the wound, I worry about cellulitis so I will treat her with Levaquin for the time being.  Also, she has been given dexamethasone 4 mg intramuscular in case there is an allergic component to this. 2. If she does not improve, she will let me know.   Meds ordered this encounter  Medications  . levofloxacin (LEVAQUIN) 500 MG tablet    Sig: Take 1 tablet (500 mg total) by mouth daily.    Dispense:  7 tablet    Refill:  0    Avagail Whittlesey Luther Parody, MD

## 2019-07-19 ENCOUNTER — Other Ambulatory Visit (INDEPENDENT_AMBULATORY_CARE_PROVIDER_SITE_OTHER): Payer: Self-pay | Admitting: Internal Medicine

## 2019-08-17 ENCOUNTER — Ambulatory Visit (INDEPENDENT_AMBULATORY_CARE_PROVIDER_SITE_OTHER): Payer: Medicare Other | Admitting: Internal Medicine

## 2019-08-17 ENCOUNTER — Encounter (INDEPENDENT_AMBULATORY_CARE_PROVIDER_SITE_OTHER): Payer: Self-pay | Admitting: Internal Medicine

## 2019-08-17 ENCOUNTER — Other Ambulatory Visit: Payer: Self-pay

## 2019-08-17 VITALS — BP 120/65 | HR 75 | Temp 96.9°F | Ht 62.0 in | Wt 123.4 lb

## 2019-08-17 DIAGNOSIS — E559 Vitamin D deficiency, unspecified: Secondary | ICD-10-CM

## 2019-08-17 DIAGNOSIS — I10 Essential (primary) hypertension: Secondary | ICD-10-CM | POA: Diagnosis not present

## 2019-08-17 DIAGNOSIS — E782 Mixed hyperlipidemia: Secondary | ICD-10-CM

## 2019-08-17 NOTE — Progress Notes (Signed)
Metrics: Intervention Frequency ACO  Documented Smoking Status Yearly  Screened one or more times in 24 months  Cessation Counseling or  Active cessation medication Past 24 months  Past 24 months   Guideline developer: UpToDate (See UpToDate for funding source) Date Released: 2014       Wellness Office Visit  Subjective:  Patient ID: Jackie Phelps, female    DOB: [birth year]  Age: 86 y.o. MRN: [id number]  CC: This lady comes in for follow-up of hypertension, hyperlipidemia, vitamin D deficiency. HPI  She has underlying dementia which seems to be getting worse according to family members. Otherwise she is doing well.  She continues on amlodipine, carvedilol for hypertension. She is taking NP thyroid, off label, for symptoms of thyroid deficiency and doing well with this. She continues on statin therapy for hyperlipidemia.  She is tolerating this well.  She denies any chest pain, dyspnea or palpitations. Past Medical History:  Diagnosis Date  . Anxiety   . Complication of anesthesia    Has increased BP after surgery.  . Constipation   . HLD (hyperlipidemia) 10/26/2018  . Hypercholesteremia   . Hypertension   . Impaired memory    due to severe sleep apnea,on CPAP now;stopped breathing 53.9 times per hour previously on study.  . Sleep apnea   . Viral warts 10/26/2018  . Vitamin D deficiency disease 10/26/2018   Past Surgical History:  Procedure Laterality Date  . CATARACT EXTRACTION, BILATERAL    . COLONOSCOPY    . COLONOSCOPY  03/05/2011   Procedure: COLONOSCOPY;  Surgeon: Rogene Houston, MD;  [facility];  Service: Endoscopy;  Laterality: N/A;  1200  . CYSTOSCOPY W/ RETROGRADES Bilateral 08/11/2016   Procedure: CYSTOSCOPY WITH BILATERAL RETROGRADE PYELOGRAM;  Surgeon: Cleon Gustin, MD;  [facility]: [facility];  Service: Urology;  Laterality: Bilateral;  . HIP ARTHROPLASTY Left 01/08/2014   Procedure: ARTHROPLASTY BIPOLAR HIP;  Surgeon: Marianna Payment, MD;   Location: [place];  Service: Orthopedics;  Laterality: Left;  . TRANSURETHRAL RESECTION OF BLADDER TUMOR N/A 08/11/2016   Procedure: TRANSURETHRAL RESECTION OF BLADDER TUMOR (TURBT);  Surgeon: Cleon Gustin, MD;  [facility];  Service: Urology;  Laterality: N/A;  . TRIGGER FINGER RELEASE Bilateral    ring and middle fingers.     Family History  Problem Relation Age of Onset  . Hypertension Mother   . Healthy Daughter   . Colon cancer Neg Hx     Social History   Social History Narrative   Married 60 years.  Lives with husband.  Non-smoker.   Social History   Tobacco Use  . Smoking status: Former Smoker    Packs/day: 0.25    Years: 4.00    Pack years: 1.00    Types: Cigarettes    Quit date: 02/02/1966    Years since quitting: 53.5  . Smokeless tobacco: Never Used  Substance Use Topics  . Alcohol use: Yes    Alcohol/week: 2.0 standard drinks    Types: 2 Glasses of wine per week    Comment: Drinks wine    Current Meds  Medication Sig  . amLODipine (NORVASC) 5 MG tablet Take 1 tablet by mouth daily.  . carvedilol (COREG) 6.25 MG tablet TAKE TWO TABLETS BY MOUTH TWICE DAILY  . donepezil (ARICEPT) 10 MG tablet Take 10 mg by mouth daily.  Marland Kitchen escitalopram (LEXAPRO) 5 MG tablet TAKE 1 TABLET BY MOUTH EVERY EVENING  . levofloxacin (LEVAQUIN) 500 MG tablet Take 1 tablet (  500 mg total) by mouth daily.  . memantine (NAMENDA) 10 MG tablet Take 10 mg by mouth 2 (two) times daily.   . multivitamin-lutein (OCUVITE-LUTEIN) CAPS capsule Take 1 capsule by mouth daily.  . NP THYROID 60 MG tablet TAKE 1 TABLET BY MOUTH EVERY DAY  . SB LOW DOSE ASA EC 81 MG EC tablet Take 81 mg by mouth daily.  . simvastatin (ZOCOR) 20 MG tablet TAKE 1 TABLET BY MOUTH EVERY DAY  . thyroid (NP THYROID) 60 MG tablet NP Thyroid 60 mg tablet  TAKE 1 TABLET BY MOUTH EVERY DAY       Depression screen [facility] 2/9 04/12/2019 01/28/2019 10/26/2018  Decreased Interest 0 0 0  Down, Depressed, Hopeless 0 0 0    PHQ - 2 Score 0 0 0     Objective:   Today's Vitals: BP 120/65 (BP Location: Left Arm, Patient Position: Sitting, Cuff Size: Normal)   Pulse 75   Temp (!) 96.9 F (36.1 C) (Temporal)   Ht 5\' 2"  (1.575 m)   Wt 123 lb 6.4 oz (56 kg)   SpO2 95%   BMI 22.57 kg/m  Vitals with BMI 08/17/2019 07/18/2019 05/25/2019  Height 5\' 2"  5\' 2"  5\' 4"   Weight 123 lbs 6 oz 123 lbs 118 lbs  BMI 22.56 81.85 90.93  Systolic 112 162 446  Diastolic 65 60 76  Pulse 75 85 57     Physical Exam  She looks systemically well.  Blood pressure is excellent.  No new physical findings.  I applied cryotherapy to a couple of warts that she had in her arm and leg.     Assessment   1. Essential hypertension   2. Mixed hyperlipidemia   3. Vitamin D deficiency disease       Tests ordered Orders Placed This Encounter  Procedures  . COMPLETE METABOLIC PANEL WITH GFR     Plan: 1. She will continue with antihypertensive therapy as before and this is controlling her blood pressure.  We will check renal function today. 2. She will continue with statin therapy for hyperlipidemia and she is tolerating this well. 3. She will continue with vitamin D3 supplementation for vitamin D deficiency. 4. Follow-up in 4 months.   No orders of the defined types were placed in this encounter.   Doree Albee, MD

## 2019-08-18 LAB — COMPLETE METABOLIC PANEL WITH GFR
AG Ratio: 1.8 (calc) (ref 1.0–2.5)
ALT: 8 U/L (ref 6–29)
AST: 14 U/L (ref 10–35)
Albumin: 4.2 g/dL (ref 3.6–5.1)
Alkaline phosphatase (APISO): 57 U/L (ref 37–153)
BUN/Creatinine Ratio: 22 (calc) (ref 6–22)
BUN: 22 mg/dL (ref 7–25)
CO2: 27 mmol/L (ref 20–32)
Calcium: 9.8 mg/dL (ref 8.6–10.4)
Chloride: 105 mmol/L (ref 98–110)
Creat: 0.99 mg/dL — ABNORMAL HIGH (ref 0.60–0.88)
GFR, Est African American: 60 mL/min/{1.73_m2} (ref >=60)
GFR, Est Non African American: 52 mL/min/{1.73_m2} — ABNORMAL LOW (ref >=60)
Globulin: 2.3 g/dL (calc) (ref 1.9–3.7)
Glucose, Bld: 75 mg/dL (ref 65–99)
Potassium: 4.1 mmol/L (ref 3.5–5.3)
Sodium: 142 mmol/L (ref 135–146)
Total Bilirubin: 0.6 mg/dL (ref 0.2–1.2)
Total Protein: 6.5 g/dL (ref 6.1–8.1)

## 2019-10-12 ENCOUNTER — Ambulatory Visit (INDEPENDENT_AMBULATORY_CARE_PROVIDER_SITE_OTHER): Payer: Medicare Other

## 2019-10-12 ENCOUNTER — Other Ambulatory Visit: Payer: Self-pay

## 2019-10-12 DIAGNOSIS — Z79899 Other long term (current) drug therapy: Secondary | ICD-10-CM | POA: Diagnosis not present

## 2019-10-12 DIAGNOSIS — I10 Essential (primary) hypertension: Secondary | ICD-10-CM | POA: Diagnosis not present

## 2019-10-12 DIAGNOSIS — G4733 Obstructive sleep apnea (adult) (pediatric): Secondary | ICD-10-CM | POA: Diagnosis not present

## 2019-10-12 DIAGNOSIS — Z23 Encounter for immunization: Secondary | ICD-10-CM

## 2019-10-12 NOTE — Progress Notes (Signed)
Pt given high dose for the flu injection.To the rt arm; pt tolerated well;no complaints.

## 2019-10-16 ENCOUNTER — Other Ambulatory Visit (INDEPENDENT_AMBULATORY_CARE_PROVIDER_SITE_OTHER): Payer: Self-pay | Admitting: Internal Medicine

## 2019-11-16 ENCOUNTER — Other Ambulatory Visit (INDEPENDENT_AMBULATORY_CARE_PROVIDER_SITE_OTHER): Payer: Self-pay | Admitting: Internal Medicine

## 2019-12-15 ENCOUNTER — Other Ambulatory Visit (INDEPENDENT_AMBULATORY_CARE_PROVIDER_SITE_OTHER): Payer: Self-pay | Admitting: Internal Medicine

## 2019-12-19 ENCOUNTER — Encounter (INDEPENDENT_AMBULATORY_CARE_PROVIDER_SITE_OTHER): Payer: Self-pay | Admitting: Internal Medicine

## 2019-12-19 ENCOUNTER — Other Ambulatory Visit: Payer: Self-pay

## 2019-12-19 ENCOUNTER — Ambulatory Visit (INDEPENDENT_AMBULATORY_CARE_PROVIDER_SITE_OTHER): Payer: Medicare Other | Admitting: Internal Medicine

## 2019-12-19 VITALS — BP 110/62 | HR 78 | Temp 98.2°F | Ht 62.0 in | Wt 122.6 lb

## 2019-12-19 DIAGNOSIS — E782 Mixed hyperlipidemia: Secondary | ICD-10-CM

## 2019-12-19 DIAGNOSIS — F0390 Unspecified dementia without behavioral disturbance: Secondary | ICD-10-CM

## 2019-12-19 DIAGNOSIS — I10 Essential (primary) hypertension: Secondary | ICD-10-CM

## 2019-12-19 NOTE — Progress Notes (Signed)
Metrics: Intervention Frequency ACO  Documented Smoking Status Yearly  Screened one or more times in 24 months  Cessation Counseling or  Active cessation medication Past 24 months  Past 24 months   Guideline developer: UpToDate (See UpToDate for funding source) Date Released: 2014       Wellness Office Visit  Subjective:  Patient ID: Jackie Phelps, female    DOB: 1931/10/22  Age: 86 y.o. MRN: [id number]  CC: This lady comes in for follow-up of her dementia, hypertension and hyperlipidemia. HPI She continues on Aricept and Namenda for her dementia.  This seems to be keeping her stable. She continues on desiccated thyroid, off label, for her symptoms of thyroid hypofunction. She continues on amlodipine and carvedilol for her hypertension. She continues on simvastatin for hyperlipidemia. . Past Medical History:  Diagnosis Date  . Anxiety   . Complication of anesthesia    Has increased BP after surgery.  . Constipation   . HLD (hyperlipidemia) 10/26/2018  . Hypercholesteremia   . Hypertension   . Impaired memory    due to severe sleep apnea,on CPAP now;stopped breathing 53.9 times per hour previously on study.  . Sleep apnea   . Viral warts 10/26/2018  . Vitamin D deficiency disease 10/26/2018   Past Surgical History:  Procedure Laterality Date  . CATARACT EXTRACTION, BILATERAL    . COLONOSCOPY    . COLONOSCOPY  03/05/2011   Procedure: COLONOSCOPY;  Surgeon: Rogene Houston, MD;  [facility];  Service: Endoscopy;  Laterality: N/A;  1200  . CYSTOSCOPY W/ RETROGRADES Bilateral 08/11/2016   Procedure: CYSTOSCOPY WITH BILATERAL RETROGRADE PYELOGRAM;  Surgeon: Cleon Gustin, MD;  [facility];  Service: Urology;  Laterality: Bilateral;  . HIP ARTHROPLASTY Left 01/08/2014   Procedure: ARTHROPLASTY BIPOLAR HIP;  Surgeon: Marianna Payment, MD;  Location: [place];  Service: Orthopedics;  Laterality: Left;  . TRANSURETHRAL RESECTION OF BLADDER TUMOR N/A 08/11/2016    Procedure: TRANSURETHRAL RESECTION OF BLADDER TUMOR (TURBT);  Surgeon: Cleon Gustin, MD;  [facility];  Service: Urology;  Laterality: N/A;  . TRIGGER FINGER RELEASE Bilateral    ring and middle fingers.     Family History  Problem Relation Age of Onset  . Hypertension Mother   . Healthy Daughter   . Colon cancer Neg Hx     Social History   Social History Narrative   Married 60 years.  Lives with husband.  Non-smoker.   Social History   Tobacco Use  . Smoking status: Former Smoker    Packs/day: 0.25    Years: 4.00    Pack years: 1.00    Types: Cigarettes    Quit date: 02/02/1966    Years since quitting: 53.9  . Smokeless tobacco: Never Used  Substance Use Topics  . Alcohol use: Yes    Alcohol/week: 2.0 standard drinks    Types: 2 Glasses of wine per week    Comment: Drinks wine    Current Meds  Medication Sig  . amLODipine (NORVASC) 5 MG tablet Take 1 tablet by mouth daily.  . carvedilol (COREG) 6.25 MG tablet TAKE TWO TABLETS BY MOUTH TWICE DAILY  . donepezil (ARICEPT) 10 MG tablet Take 10 mg by mouth daily.  Marland Kitchen escitalopram (LEXAPRO) 5 MG tablet TAKE 1 TABLET BY MOUTH EVERY EVENING  . hydrochlorothiazide (HYDRODIURIL) 12.5 MG tablet TAKE 1 TABLET BY MOUTH EVERY DAY  . levofloxacin (LEVAQUIN) 500 MG tablet Take 1 tablet (500 mg total) by mouth daily.  Marland Kitchen  memantine (NAMENDA) 10 MG tablet Take 10 mg by mouth 2 (two) times daily.   . multivitamin-lutein (OCUVITE-LUTEIN) CAPS capsule Take 1 capsule by mouth daily.  . SB LOW DOSE ASA EC 81 MG EC tablet Take 81 mg by mouth daily.  . simvastatin (ZOCOR) 20 MG tablet TAKE 1 TABLET BY MOUTH EVERY DAY  . thyroid (ARMOUR) 60 MG tablet NP Thyroid 60 mg tablet  TAKE 1 TABLET BY MOUTH EVERY DAY      Depression screen [facility] 2/9 12/19/2019 04/12/2019 01/28/2019 10/26/2018  Decreased Interest 0 0 0 0  Down, Depressed, Hopeless 0 0 0 0  PHQ - 2 Score 0 0 0 0  Altered sleeping 0 - - -  Tired, decreased energy 0 - - -   Change in appetite 0 - - -  Feeling bad or failure about yourself  0 - - -  Trouble concentrating 0 - - -  Moving slowly or fidgety/restless 0 - - -  Suicidal thoughts 0 - - -  PHQ-9 Score 0 - - -  Difficult doing work/chores Not difficult at all - - -     Objective:   Today's Vitals: BP 110/62   Pulse 78   Temp 98.2 F (36.8 C) (Temporal)   Ht 5\' 2"  (1.575 m)   Wt 122 lb 9.6 oz (55.6 kg)   SpO2 98%   BMI 22.42 kg/m  Vitals with BMI 12/19/2019 08/17/2019 07/18/2019  Height 5\' 2"  5\' 2"  5\' 2"   Weight 122 lbs 10 oz 123 lbs 6 oz 123 lbs  BMI 22.42 62.83 15.17  Systolic 616 073 710  Diastolic 62 65 60  Pulse 78 75 85     Physical Exam   She looks systemically well.  Cheerful self as usual.  Blood pressure is excellent.    Assessment   1. Dementia without behavioral disturbance, unspecified dementia type ([place])   2. Essential hypertension   3. Mixed hyperlipidemia       Tests ordered Orders Placed This Encounter  Procedures  . COMPLETE METABOLIC PANEL WITH GFR  . Lipid panel     Plan: 1. Continue with Namenda and Aricept for dementia.  This is stable. 2. Continue with amlodipine and carvedilol for hypertension.  Her blood pressure is under good control. 3. Continue on statin therapy for hyperlipidemia.  We will check a lipid panel. 4. Follow-up in 6 months.  I have recommended COVID-19 booster dose now.   No orders of the defined types were placed in this encounter.   Doree Albee, MD

## 2019-12-20 DIAGNOSIS — L821 Other seborrheic keratosis: Secondary | ICD-10-CM | POA: Diagnosis not present

## 2019-12-20 DIAGNOSIS — Z8589 Personal history of malignant neoplasm of other organs and systems: Secondary | ICD-10-CM | POA: Diagnosis not present

## 2019-12-20 DIAGNOSIS — L570 Actinic keratosis: Secondary | ICD-10-CM | POA: Diagnosis not present

## 2019-12-20 LAB — LIPID PANEL
Cholesterol: 191 mg/dL (ref <200)
HDL: 53 mg/dL (ref >=50)
LDL Cholesterol (Calc): 104 mg/dL (calc) — ABNORMAL HIGH
Non-HDL Cholesterol (Calc): 138 mg/dL (calc) — ABNORMAL HIGH (ref <130)
Total CHOL/HDL Ratio: 3.6 (calc) (ref <5.0)
Triglycerides: 224 mg/dL — ABNORMAL HIGH (ref <150)

## 2019-12-20 LAB — COMPLETE METABOLIC PANEL WITH GFR
AG Ratio: 1.8 (calc) (ref 1.0–2.5)
ALT: 7 U/L (ref 6–29)
AST: 14 U/L (ref 10–35)
Albumin: 4.1 g/dL (ref 3.6–5.1)
Alkaline phosphatase (APISO): 55 U/L (ref 37–153)
BUN/Creatinine Ratio: 19 (calc) (ref 6–22)
BUN: 19 mg/dL (ref 7–25)
CO2: 28 mmol/L (ref 20–32)
Calcium: 9.7 mg/dL (ref 8.6–10.4)
Chloride: 104 mmol/L (ref 98–110)
Creat: 0.99 mg/dL — ABNORMAL HIGH (ref 0.60–0.88)
GFR, Est African American: 60 mL/min/{1.73_m2} (ref >=60)
GFR, Est Non African American: 52 mL/min/{1.73_m2} — ABNORMAL LOW (ref >=60)
Globulin: 2.3 g/dL (calc) (ref 1.9–3.7)
Glucose, Bld: 122 mg/dL — ABNORMAL HIGH (ref 65–99)
Potassium: 4 mmol/L (ref 3.5–5.3)
Sodium: 142 mmol/L (ref 135–146)
Total Bilirubin: 0.6 mg/dL (ref 0.2–1.2)
Total Protein: 6.4 g/dL (ref 6.1–8.1)

## 2020-01-27 ENCOUNTER — Other Ambulatory Visit: Payer: Self-pay

## 2020-01-27 ENCOUNTER — Inpatient Hospital Stay (HOSPITAL_COMMUNITY): Payer: Medicare Other

## 2020-01-27 ENCOUNTER — Inpatient Hospital Stay (HOSPITAL_COMMUNITY)
Admission: EM | Admit: 2020-01-27 | Discharge: 2020-01-30 | DRG: 470 | Disposition: A | Discharge status: Home / Self Care | Payer: Medicare Other | Attending: Family Medicine | Admitting: Family Medicine

## 2020-01-27 ENCOUNTER — Encounter (HOSPITAL_COMMUNITY): Payer: Self-pay | Admitting: Emergency Medicine

## 2020-01-27 ENCOUNTER — Emergency Department (HOSPITAL_COMMUNITY): Payer: Medicare Other

## 2020-01-27 DIAGNOSIS — E039 Hypothyroidism, unspecified: Secondary | ICD-10-CM | POA: Diagnosis present

## 2020-01-27 DIAGNOSIS — Z96642 Presence of left artificial hip joint: Secondary | ICD-10-CM | POA: Diagnosis present

## 2020-01-27 DIAGNOSIS — Z87891 Personal history of nicotine dependence: Secondary | ICD-10-CM | POA: Diagnosis not present

## 2020-01-27 DIAGNOSIS — F411 Generalized anxiety disorder: Secondary | ICD-10-CM | POA: Diagnosis present

## 2020-01-27 DIAGNOSIS — I10 Essential (primary) hypertension: Secondary | ICD-10-CM | POA: Diagnosis not present

## 2020-01-27 DIAGNOSIS — Z20822 Contact with and (suspected) exposure to covid-19: Secondary | ICD-10-CM | POA: Diagnosis not present

## 2020-01-27 DIAGNOSIS — Z9989 Dependence on other enabling machines and devices: Secondary | ICD-10-CM | POA: Diagnosis not present

## 2020-01-27 DIAGNOSIS — Z79890 Hormone replacement therapy (postmenopausal): Secondary | ICD-10-CM | POA: Diagnosis not present

## 2020-01-27 DIAGNOSIS — S72011A Unspecified intracapsular fracture of right femur, initial encounter for closed fracture: Secondary | ICD-10-CM | POA: Diagnosis present

## 2020-01-27 DIAGNOSIS — D72828 Other elevated white blood cell count: Secondary | ICD-10-CM | POA: Diagnosis not present

## 2020-01-27 DIAGNOSIS — W19XXXA Unspecified fall, initial encounter: Secondary | ICD-10-CM | POA: Diagnosis not present

## 2020-01-27 DIAGNOSIS — Z8249 Family history of ischemic heart disease and other diseases of the circulatory system: Secondary | ICD-10-CM | POA: Diagnosis not present

## 2020-01-27 DIAGNOSIS — G4733 Obstructive sleep apnea (adult) (pediatric): Secondary | ICD-10-CM | POA: Diagnosis present

## 2020-01-27 DIAGNOSIS — Z419 Encounter for procedure for purposes other than remedying health state, unspecified: Secondary | ICD-10-CM

## 2020-01-27 DIAGNOSIS — F32A Depression, unspecified: Secondary | ICD-10-CM | POA: Diagnosis not present

## 2020-01-27 DIAGNOSIS — E559 Vitamin D deficiency, unspecified: Secondary | ICD-10-CM | POA: Diagnosis not present

## 2020-01-27 DIAGNOSIS — S72001A Fracture of unspecified part of neck of right femur, initial encounter for closed fracture: Secondary | ICD-10-CM

## 2020-01-27 DIAGNOSIS — G309 Alzheimer's disease, unspecified: Secondary | ICD-10-CM | POA: Diagnosis present

## 2020-01-27 DIAGNOSIS — K5909 Other constipation: Secondary | ICD-10-CM | POA: Diagnosis present

## 2020-01-27 DIAGNOSIS — Z79899 Other long term (current) drug therapy: Secondary | ICD-10-CM

## 2020-01-27 DIAGNOSIS — W109XXA Fall (on) (from) unspecified stairs and steps, initial encounter: Secondary | ICD-10-CM | POA: Diagnosis present

## 2020-01-27 DIAGNOSIS — Z888 Allergy status to other drugs, medicaments and biological substances status: Secondary | ICD-10-CM

## 2020-01-27 DIAGNOSIS — Z880 Allergy status to penicillin: Secondary | ICD-10-CM

## 2020-01-27 DIAGNOSIS — R6889 Other general symptoms and signs: Secondary | ICD-10-CM | POA: Diagnosis not present

## 2020-01-27 DIAGNOSIS — Z01818 Encounter for other preprocedural examination: Secondary | ICD-10-CM

## 2020-01-27 DIAGNOSIS — E785 Hyperlipidemia, unspecified: Secondary | ICD-10-CM | POA: Diagnosis not present

## 2020-01-27 DIAGNOSIS — S72041A Displaced fracture of base of neck of right femur, initial encounter for closed fracture: Secondary | ICD-10-CM | POA: Diagnosis not present

## 2020-01-27 DIAGNOSIS — Z96643 Presence of artificial hip joint, bilateral: Secondary | ICD-10-CM | POA: Diagnosis not present

## 2020-01-27 DIAGNOSIS — Z743 Need for continuous supervision: Secondary | ICD-10-CM | POA: Diagnosis not present

## 2020-01-27 DIAGNOSIS — E876 Hypokalemia: Secondary | ICD-10-CM | POA: Diagnosis present

## 2020-01-27 DIAGNOSIS — M25551 Pain in right hip: Secondary | ICD-10-CM | POA: Diagnosis not present

## 2020-01-27 DIAGNOSIS — G4730 Sleep apnea, unspecified: Secondary | ICD-10-CM | POA: Diagnosis not present

## 2020-01-27 DIAGNOSIS — S728X1A Other fracture of right femur, initial encounter for closed fracture: Secondary | ICD-10-CM | POA: Diagnosis not present

## 2020-01-27 DIAGNOSIS — Z471 Aftercare following joint replacement surgery: Secondary | ICD-10-CM | POA: Diagnosis not present

## 2020-01-27 LAB — CBC WITH DIFFERENTIAL/PLATELET
Abs Immature Granulocytes: 0.02 10*3/uL (ref 0.00–0.07)
Basophils Absolute: 0.1 10*3/uL (ref 0.0–0.1)
Basophils Relative: 1 %
Eosinophils Absolute: 0.3 10*3/uL (ref 0.0–0.5)
Eosinophils Relative: 4 %
HCT: 38.8 % (ref 36.0–46.0)
Hemoglobin: 12.9 g/dL (ref 12.0–15.0)
Immature Granulocytes: 0 %
Lymphocytes Relative: 8 %
Lymphs Abs: 0.8 10*3/uL (ref 0.7–4.0)
MCH: 29.7 pg (ref 26.0–34.0)
MCHC: 33.2 g/dL (ref 30.0–36.0)
MCV: 89.4 fL (ref 80.0–100.0)
Monocytes Absolute: 0.6 10*3/uL (ref 0.1–1.0)
Monocytes Relative: 7 %
Neutro Abs: 7.2 10*3/uL (ref 1.7–7.7)
Neutrophils Relative %: 80 %
Platelets: 141 10*3/uL — ABNORMAL LOW (ref 150–400)
RBC: 4.34 MIL/uL (ref 3.87–5.11)
RDW: 12.2 % (ref 11.5–15.5)
WBC: 9 10*3/uL (ref 4.0–10.5)
nRBC: 0 % (ref 0.0–0.2)

## 2020-01-27 LAB — COMPREHENSIVE METABOLIC PANEL
ALT: 14 U/L (ref 0–44)
AST: 19 U/L (ref 15–41)
Albumin: 3.7 g/dL (ref 3.5–5.0)
Alkaline Phosphatase: 55 U/L (ref 38–126)
Anion gap: 11 (ref 5–15)
BUN: 19 mg/dL (ref 8–23)
CO2: 25 mmol/L (ref 22–32)
Calcium: 8.8 mg/dL — ABNORMAL LOW (ref 8.9–10.3)
Chloride: 100 mmol/L (ref 98–111)
Creatinine, Ser: 0.83 mg/dL (ref 0.44–1.00)
GFR, Estimated: >60 mL/min (ref >60)
Glucose, Bld: 106 mg/dL — ABNORMAL HIGH (ref 70–99)
Potassium: 3.1 mmol/L — ABNORMAL LOW (ref 3.5–5.1)
Sodium: 136 mmol/L (ref 135–145)
Total Bilirubin: 1 mg/dL (ref 0.3–1.2)
Total Protein: 6.3 g/dL — ABNORMAL LOW (ref 6.5–8.1)

## 2020-01-27 LAB — TYPE AND SCREEN
ABO/RH(D): A POS
Antibody Screen: NEGATIVE

## 2020-01-27 LAB — PROTIME-INR
INR: 1 (ref 0.8–1.2)
Prothrombin Time: 12.7 seconds (ref 11.4–15.2)

## 2020-01-27 LAB — VITAMIN D 25 HYDROXY (VIT D DEFICIENCY, FRACTURES): Vit D, 25-Hydroxy: 116.81 ng/mL — ABNORMAL HIGH (ref 30–100)

## 2020-01-27 LAB — SARS CORONAVIRUS 2 BY RT PCR (HOSPITAL ORDER, PERFORMED IN ~~LOC~~ HOSPITAL LAB): SARS Coronavirus 2: NEGATIVE

## 2020-01-27 MED ORDER — HYDROCODONE-ACETAMINOPHEN 5-325 MG PO TABS
2.0000 | ORAL_TABLET | Freq: Once | ORAL | Status: AC
  Administered 2020-01-27: 2 via ORAL
  Filled 2020-01-27: qty 2

## 2020-01-27 MED ORDER — METHOCARBAMOL 1000 MG/10ML IJ SOLN
500.0000 mg | Freq: Four times a day (QID) | INTRAVENOUS | Status: DC | PRN
  Filled 2020-01-27: qty 5

## 2020-01-27 MED ORDER — HYDROCODONE-ACETAMINOPHEN 5-325 MG PO TABS
1.0000 | ORAL_TABLET | Freq: Four times a day (QID) | ORAL | Status: DC | PRN
  Administered 2020-01-28 – 2020-01-29 (×2): 1 via ORAL
  Filled 2020-01-27 (×2): qty 1

## 2020-01-27 MED ORDER — ESCITALOPRAM OXALATE 10 MG PO TABS
5.0000 mg | ORAL_TABLET | Freq: Every day | ORAL | Status: DC
  Administered 2020-01-27 – 2020-01-30 (×3): 5 mg via ORAL
  Filled 2020-01-27 (×3): qty 1

## 2020-01-27 MED ORDER — METHOCARBAMOL 500 MG PO TABS
500.0000 mg | ORAL_TABLET | Freq: Four times a day (QID) | ORAL | Status: DC | PRN
  Administered 2020-01-27 – 2020-01-29 (×3): 500 mg via ORAL
  Filled 2020-01-27 (×3): qty 1

## 2020-01-27 MED ORDER — POLYETHYLENE GLYCOL 3350 17 G PO PACK: 17.0000 g | PACK | Freq: Every day | ORAL | Status: DC | PRN

## 2020-01-27 MED ORDER — MORPHINE SULFATE (PF) 2 MG/ML IV SOLN: 0.5000 mg | INTRAVENOUS | Status: DC | PRN

## 2020-01-27 MED ORDER — SIMVASTATIN 20 MG PO TABS
20.0000 mg | ORAL_TABLET | Freq: Every day | ORAL | Status: DC
  Administered 2020-01-28 – 2020-01-30 (×3): 20 mg via ORAL
  Filled 2020-01-27 (×3): qty 1

## 2020-01-27 MED ORDER — CARVEDILOL 12.5 MG PO TABS
12.5000 mg | ORAL_TABLET | Freq: Two times a day (BID) | ORAL | Status: DC
  Administered 2020-01-27 – 2020-01-30 (×7): 12.5 mg via ORAL
  Filled 2020-01-27 (×7): qty 1

## 2020-01-27 MED ORDER — POTASSIUM CHLORIDE CRYS ER 20 MEQ PO TBCR
40.0000 meq | EXTENDED_RELEASE_TABLET | ORAL | Status: AC
  Administered 2020-01-27 (×2): 40 meq via ORAL
  Filled 2020-01-27 (×2): qty 2

## 2020-01-27 MED ORDER — HEPARIN SODIUM (PORCINE) 5000 UNIT/ML IJ SOLN
5000.0000 [IU] | Freq: Three times a day (TID) | INTRAMUSCULAR | Status: DC
  Administered 2020-01-27 (×2): 5000 [IU] via SUBCUTANEOUS
  Filled 2020-01-27 (×2): qty 1

## 2020-01-27 MED ORDER — ASPIRIN EC 81 MG PO TBEC
81.0000 mg | DELAYED_RELEASE_TABLET | Freq: Every day | ORAL | Status: DC
  Administered 2020-01-27 – 2020-01-30 (×3): 81 mg via ORAL
  Filled 2020-01-27 (×3): qty 1

## 2020-01-27 MED ORDER — SODIUM CHLORIDE 0.9 % IV BOLUS
500.0000 mL | Freq: Once | INTRAVENOUS | Status: AC
  Administered 2020-01-27: 500 mL via INTRAVENOUS

## 2020-01-27 MED ORDER — DONEPEZIL HCL 10 MG PO TABS
10.0000 mg | ORAL_TABLET | Freq: Every day | ORAL | Status: DC
  Administered 2020-01-29 – 2020-01-30 (×2): 10 mg via ORAL
  Filled 2020-01-27 (×2): qty 1

## 2020-01-27 MED ORDER — OCUVITE-LUTEIN PO CAPS
1.0000 | ORAL_CAPSULE | Freq: Every day | ORAL | Status: DC
  Filled 2020-01-27: qty 1

## 2020-01-27 MED ORDER — THYROID 60 MG PO TABS
60.0000 mg | ORAL_TABLET | Freq: Every day | ORAL | Status: DC
  Administered 2020-01-29 – 2020-01-30 (×2): 60 mg via ORAL
  Filled 2020-01-27 (×6): qty 1

## 2020-01-27 MED ORDER — MORPHINE SULFATE (PF) 4 MG/ML IV SOLN
4.0000 mg | Freq: Once | INTRAVENOUS | Status: AC
  Administered 2020-01-27: 4 mg via INTRAVENOUS
  Filled 2020-01-27: qty 1

## 2020-01-27 MED ORDER — MUPIROCIN 2 % EX OINT
1.0000 "application " | TOPICAL_OINTMENT | Freq: Two times a day (BID) | CUTANEOUS | Status: DC
  Filled 2020-01-27: qty 22

## 2020-01-27 MED ORDER — ENSURE ENLIVE PO LIQD
237.0000 mL | Freq: Two times a day (BID) | ORAL | Status: DC
  Administered 2020-01-28 – 2020-01-30 (×2): 237 mL via ORAL
  Filled 2020-01-27 (×7): qty 237

## 2020-01-27 MED ORDER — MEMANTINE HCL 10 MG PO TABS
10.0000 mg | ORAL_TABLET | Freq: Two times a day (BID) | ORAL | Status: DC
  Administered 2020-01-27 – 2020-01-30 (×5): 10 mg via ORAL
  Filled 2020-01-27 (×5): qty 1

## 2020-01-27 NOTE — H&P (Addendum)
History and Physical    Jackie Phelps XBM:[id number] DOB: 1931-11-04 DOA: 01/27/2020  PCP: Doree Albee, MD   Patient coming from: home   I have personally briefly reviewed patient's old medical records in [place]  Chief Complaint: Right hip pain.  HPI: Jackie Phelps is a 87 y.o. female with medical history significant of impaired memory/dementia (mild), anxiety/depression, hypertension, hyperlipidemia, sleep apnea vitamin D deficiency; presented to the emergency department complaining of right hip pain.  Patient reports about 2 days ago experiencing a mechanical fall on the back stairs of her house.  She reports no loss of consciousness, no chest pain, no dizziness, no nausea, no vomiting, no dysuria, no hematuria, no melena, no hematochezia, no shortness of breath, no fever or chills, no sick contacts.  Patient ended up tripping and falling landing on her right side.  She reports ongoing worsening pain with ambulation and weightbearing that triggered visit to the emergency department for further evaluation and management.  Of note, patient is vaccinated against COVID with Moderna; COVID test pending for admission.  ED Course: Right hip x-ray demonstrating right femoral neck fracture; blood work within normal limits except for mild hypokalemia.  Hemodynamically stable.  Pain medication provided and orthopedic service consulted; recommendations for surgical repair at [facility] provided.  [facility] has been contacted to facilitate admission.  Review of Systems: As per HPI otherwise all other systems reviewed and are negative.   Past Medical History:  Diagnosis Date  . Anxiety   . Complication of anesthesia    Has increased BP after surgery.  . Constipation   . HLD (hyperlipidemia) 10/26/2018  . Hypercholesteremia   . Hypertension   . Impaired memory    due to severe sleep apnea,on CPAP now;stopped breathing 53.9 times per hour previously on study.  . Sleep apnea    . Viral warts 10/26/2018  . Vitamin D deficiency disease 10/26/2018    Past Surgical History:  Procedure Laterality Date  . CATARACT EXTRACTION, BILATERAL    . COLONOSCOPY    . COLONOSCOPY  03/05/2011   Procedure: COLONOSCOPY;  Surgeon: Rogene Houston, MD;  [facility];  Service: Endoscopy;  Laterality: N/A;  1200  . CYSTOSCOPY W/ RETROGRADES Bilateral 08/11/2016   Procedure: CYSTOSCOPY WITH BILATERAL RETROGRADE PYELOGRAM;  Surgeon: Cleon Gustin, MD;  Location: [facility];  Service: Urology;  Laterality: Bilateral;  . HIP ARTHROPLASTY Left 01/08/2014   Procedure: ARTHROPLASTY BIPOLAR HIP;  Surgeon: Marianna Payment, MD;  Location: [place];  Service: Orthopedics;  Laterality: Left;  . TRANSURETHRAL RESECTION OF BLADDER TUMOR N/A 08/11/2016   Procedure: TRANSURETHRAL RESECTION OF BLADDER TUMOR (TURBT);  Surgeon: Cleon Gustin, MD;  Location: [facility];  Service: Urology;  Laterality: N/A;  . TRIGGER FINGER RELEASE Bilateral    ring and middle fingers.    Social History  reports that she quit smoking about 54 years ago. Her smoking use included cigarettes. She has a 1.00 pack-year smoking history. She has never used smokeless tobacco. She reports current alcohol use of about 2.0 standard drinks of alcohol per week. She reports that she does not use drugs.  Allergies  Allergen Reactions  . Penicillins Rash and Hives    Has patient had a PCN reaction causing immediate rash, facial/tongue/throat swelling, SOB or lightheadedness with hypotension: unknown Has patient had a PCN reaction causing severe rash involving mucus membranes or skin necrosis: unknown Has patient had a PCN reaction that required hospitalization: unknown Has patient  had a PCN reaction occurring within the last 10 years: unknown If all of the above answers are "NO", then may proceed with Cephalosporin use. Has patient had a PCN reaction causing immediate rash, facial/tongue/throat swelling, SOB or  lightheadedness with hypotension: unknown Has patient had a PCN reaction causing severe rash involving mucus membranes or skin necrosis: unknown Has patient had a PCN reaction that required hospitalization: unknown Has patient had a PCN reaction occurring within the last 10 years: unknown If all of the above answers are "NO", then may proceed with Cephalosporin use.  Has patient had a PCN reaction causing immediate rash, facial/tongue/throat swelling, SOB or lightheadedness with hypotension: unknown Has patient had a PCN reaction causing severe rash involving mucus membranes or skin necrosis: unknown Has patient had a PCN reaction that required hospitalization: unknown Has patient had a PCN reaction occurring within the last 10 years: unknown If all of the above answers are "NO", then may proceed with Cephalosporin use.  Marland Kitchen Hydrochlorothiazide Other (See Comments)    Dehydration    Family History  Problem Relation Age of Onset  . Hypertension Mother   . Healthy Daughter   . Colon cancer Neg Hx     Prior to Admission medications   Medication Sig Start Date End Date Taking? Authorizing Provider  carvedilol (COREG) 6.25 MG tablet TAKE TWO TABLETS BY MOUTH TWICE DAILY Patient taking differently: Take 12.5 mg by mouth 2 (two) times daily with a meal. 12/15/19  Yes Gosrani, Nimish C, MD  donepezil (ARICEPT) 10 MG tablet Take 10 mg by mouth daily. 06/18/16  Yes [provider]  escitalopram (LEXAPRO) 5 MG tablet TAKE 1 TABLET BY MOUTH EVERY EVENING Patient taking differently: Take 5 mg by mouth daily. 10/17/19  Yes Gosrani, Nimish C, MD  hydrochlorothiazide (HYDRODIURIL) 12.5 MG tablet TAKE 1 TABLET BY MOUTH EVERY DAY Patient taking differently: Take 12.5 mg by mouth daily. 10/17/19 01/15/20 Yes Gosrani, Nimish C, MD  memantine (NAMENDA) 10 MG tablet Take 10 mg by mouth 2 (two) times daily.  06/18/16  Yes [provider]  multivitamin-lutein (OCUVITE-LUTEIN) CAPS capsule Take 1  capsule by mouth daily. 07/18/16  Yes [provider]  NP THYROID 60 MG tablet TAKE 1 TABLET BY MOUTH EVERY DAY Patient taking differently: Take 60 mg by mouth daily. 11/16/19 12/16/19 Yes Gosrani, Nimish C, MD  SB LOW DOSE ASA EC 81 MG EC tablet Take 81 mg by mouth daily. 05/23/16  Yes [provider]  simvastatin (ZOCOR) 20 MG tablet TAKE 1 TABLET BY MOUTH EVERY DAY Patient taking differently: Take 20 mg by mouth daily at 12 noon. 12/15/19  Yes Doree Albee, MD    Physical Exam: Vitals:   01/27/20 1545 01/27/20 1600 01/27/20 1630 01/27/20 1700  BP: (!) 163/67 (!) 154/67 (!) 149/61 (!) 147/65  Pulse: 66 67 65 64  Resp: 13 17 19 16   Temp:      SpO2: 98% 98% 99% 99%  Weight:      Height:        Constitutional: In no major distress; patient reporting right hip pain.  No chest pain, no nausea, no vomiting, no shortness of breath. Vitals:   01/27/20 1545 01/27/20 1600 01/27/20 1630 01/27/20 1700  BP: (!) 163/67 (!) 154/67 (!) 149/61 (!) 147/65  Pulse: 66 67 65 64  Resp: 13 17 19 16   Temp:      SpO2: 98% 98% 99% 99%  Weight:      Height:  Eyes: PERRL, lids and conjunctivae normal; no icterus, no nystagmus. ENMT: Mucous membranes are moist. Posterior pharynx clear of any exudate or lesions. Neck: normal, supple, no masses, no thyromegaly.  No JVD. Respiratory: clear to auscultation bilaterally, no wheezing, no crackles. Normal respiratory effort. No accessory muscle use.  Good oxygen saturation on room air. Cardiovascular: Regular rate and rhythm, no murmurs / rubs / gallops. No extremity edema. 2+ pedal pulses. No carotid bruits.  Abdomen: no tenderness, no masses palpated. No hepatosplenomegaly. Bowel sounds positive.  Musculoskeletal: no clubbing / cyanosis. No joint deformity appreciated; decreased range of motion of the right lower extremity secondary to hip pain. Skin: no rashes, no petechiae. Neurologic: CN 2-12 grossly intact. Sensation intact, DTR  normal. Strength 5/5 in all 4.  Psychiatric: Normal judgment and insight. Alert and oriented x 3. Normal mood.    Labs on Admission: I have personally reviewed following labs and imaging studies  CBC: Recent Labs  Lab 01/27/20 1355  WBC 9.0  NEUTROABS 7.2  HGB 12.9  HCT 38.8  MCV 89.4  PLT 141*    Basic Metabolic Panel: Recent Labs  Lab 01/27/20 1357  NA 136  K 3.1*  CL 100  CO2 25  GLUCOSE 106*  BUN 19  CREATININE 0.83  CALCIUM 8.8*    GFR: Estimated Creatinine Clearance: 38.5 mL/min (by C-G formula based on SCr of 0.83 mg/dL).  Liver Function Tests: Recent Labs  Lab 01/27/20 1357  AST 19  ALT 14  ALKPHOS 55  BILITOT 1.0  PROT 6.3*  ALBUMIN 3.7    Urine analysis:    Component Value Date/Time   COLORURINE YELLOW 12/29/2016 1227   APPEARANCEUR CLOUDY (A) 12/29/2016 1227   LABSPEC 1.016 12/29/2016 1227   PHURINE 6.0 12/29/2016 1227   GLUCOSEU NEGATIVE 12/29/2016 1227   HGBUR LARGE (A) 12/29/2016 1227   BILIRUBINUR neg 05/25/2019 1500   KETONESUR NEGATIVE 12/29/2016 1227   PROTEINUR Negative 05/25/2019 1500   PROTEINUR 100 (A) 12/29/2016 1227   UROBILINOGEN negative (A) 05/25/2019 1500   UROBILINOGEN 0.2 01/08/2014 1506   NITRITE neg 05/25/2019 1500   NITRITE NEGATIVE 12/29/2016 1227   LEUKOCYTESUR Negative 05/25/2019 1500    Radiological Exams on Admission: Chest Portable 1 View  Result Date: 01/27/2020 CLINICAL DATA:  Preoperative assessment EXAM: PORTABLE CHEST 1 VIEW COMPARISON:  January 18, 2014. FINDINGS: Lungs are clear. Heart size and pulmonary vascularity are normal. No adenopathy. There is aortic atherosclerosis. No bone lesions. IMPRESSION: Lungs clear.  Cardiac silhouette normal. Aortic Atherosclerosis (ICD10-I70.0). Electronically Signed   By: Lowella Grip III M.D.   On: 01/27/2020 14:33   DG Hip Unilat With Pelvis 2-3 Views Right  Result Date: 01/27/2020 CLINICAL DATA:  Fall.  Right hip pain. EXAM: DG HIP (WITH OR WITHOUT  PELVIS) 2-3V RIGHT COMPARISON:  06/09/2016 CT.  01/26/2014 radiographs of the pelvis. FINDINGS: Deformity of the right femoral head/neck junction, most consistent with impacted subcapital fracture. No dislocation. Left hip arthroplasty. IMPRESSION: Right femoral neck fracture, likely subcapital. Electronically Signed   By: Abigail Miyamoto M.D.   On: 01/27/2020 12:56    EKG: Independently reviewed.  Sinus rhythm, no acute ischemic changes.  Normal QT.  Assessment/Plan 1-right leg femur fracture: Closed -Patient's condition presents after mechanical fall -Will check vitamin D levels -As needed analgesics and muscle relaxants will be provided -Case discussed with orthopedic service who recommended the need for surgical repair. -Patient will be transferred to [facility] for this to be accomplished. -Moderate risk for  intervention based on age and past medical history/comorbidities; No need for further preoperative evaluation. -Patient will be kept n.p.o. after midnight.  2-hypertension -Holding HCTZ to minimize the chances of electrolytes/volume status derangement -Heart healthy diet has been ordered -Continue the use of carvedilol  3-hyperlipidemia -Continue statins  4-history of hypothyroidism -Continue Armour 60mg  daily  5-history of impaired memory/dementia: Mild -Continue Aricept and Namenda -Constant reorientation and supportive care  6-depression/anxiety -No suicidal ideation, no hallucinations, normal mood currently -Continue Lexapro.  7-hypokalemia -most likely in the setting of HCTZ usage -Will replete and check magnesium level.  8-hx of sleep apnea -will use CPAP QHS  DVT prophylaxis: Heparin Code Status:   Full code Family Communication:  No family at bedside. Husband updated over the phone. Disposition Plan:   Patient is from:  Home  Anticipated DC to:  To be determined; but might end up needing short-term rehabilitation prior to discharge  home.  Anticipated DC date:  To be determined.  Anticipated DC barriers: Need for surgical repair of right neck femur fracture.  Consults called:  Orthopedic service (Dr. Stann Mainland). Admission status:  Inpatient status; MedSurg, length of stay more than 2 midnights.  Severity of Illness: Moderate severity; patient with right femoral neck fracture that will require surgical repair by orthopedic service.  Patient is a moderate risk for intervention secondary to age and comorbidities.  No chest pain or SOB and no need for further preoperative evaluation.  Case discussed with Dr. Stann Mainland (orthopedic service) has recommended to keep n.p.o. after midnight and to transfer to [facility] for surgical repair on 01/28/2020.    Barton Dubois MD [facility]  How to contact the [facility] Attending or Consulting provider [place] or covering provider during after hours [place], for this patient?   1. Check the care team in [facility] and look for a) attending/consulting [facility] provider listed and b) the [facility] team listed 2. Log into [email or web address] and use [place]'s universal password to access. If you do not have the password, please contact the hospital operator. 3. Locate the [facility] provider you are looking for under [facility] and page to a number that you can be directly reached. 4. If you still have difficulty reaching the provider, please page the [facility] (Director on Call) for the Hospitalists listed on amion for assistance.  01/27/2020, 5:32 PM

## 2020-01-27 NOTE — Plan of Care (Signed)

## 2020-01-27 NOTE — Progress Notes (Signed)
Patient discussed with the emergency department provider at [facility]. She has a right femoral neck fracture of the hip. This will need operative management with a total hip arthroplasty. She will need to be transferred to [facility] for a 7:30 AM surgery tomorrow on January 22. She can have a diet up until midnight tonight.

## 2020-01-27 NOTE — Progress Notes (Signed)
Initial Nutrition Assessment  DOCUMENTATION CODES:   Not applicable  INTERVENTION:  Ensure Enlive po BID, each supplement provides 350 kcal and 20 grams of protein   NUTRITION DIAGNOSIS:   Increased nutrient needs related to post-op healing,hip fracture as evidenced by estimated needs.   GOAL:   Patient will meet greater than or equal to 90% of their needs    MONITOR:   Labs,I & O's,Diet advancement,Skin,Supplement acceptance,PO intake,Weight trends  REASON FOR ASSESSMENT:   Consult Assessment of nutrition requirement/status (hip fx)  ASSESSMENT:  87 year old female with history significant of HTN, HLD, anxiety, OSA on CPAP, constipation, vitamin D deficiency admitted for right hip fracture.  RD working remotely.  Pt is currently in ED, unable to obtain nutrition history at this time. Per notes, pt is pending transfer to [facility] for operative management. Plans for total hip arthroplasty on 1/22.   Pt will be NPO at midnight, currently on Heart Healthy diet. Will order Ensure BID to aid with meeting increased needs to support post-op healing.  Weight history reviewed, stable over the last 3 years.  Medications reviewed   Labs: K 3.1 (L)  NUTRITION - FOCUSED PHYSICAL EXAM:  Unable to complete at this time  Diet Order:   Diet Order            Diet NPO time specified  Diet effective midnight           Diet Heart Room service appropriate? Yes; Fluid consistency: Thin  Diet effective now                 EDUCATION NEEDS:   No education needs have been identified at this time  Skin:  Skin Assessment: Reviewed RN Assessment  Last BM:  unknown  Height:   Ht Readings from Last 1 Encounters:  01/27/20 5\' 2"  (1.575 m)    Weight:   Wt Readings from Last 1 Encounters:  01/27/20 55.3 kg    BMI:  Body mass index is 22.31 kg/m.  Estimated Nutritional Needs:   Kcal:  1600-1800  Protein:  75-85  Fluid:  >1.3 L   Lajuan Lines, RD, LDN Clinical  Nutrition After Hours/Weekend Pager # in [place]

## 2020-01-27 NOTE — ED Provider Notes (Signed)
[facility] EMERGENCY DEPARTMENT Provider Note   CSN: [id number] Arrival date & time: 01/27/20  1044     History Chief Complaint  Patient presents with  . Fall    Jackie Phelps is a 87 y.o. female.  HPI   87 year old female presents the emergency department with right hip pain.  Patient states about 2 days ago she had a mechanical fall.  She was walking and slipped, falling down onto her right buttocks.  Did not hit her head, there is no loss of consciousness.  Denies any syncope.  She initially did not have any immediate pain.  But over the last day she developed soreness in her right hip/groin.  She presents today for evaluation since the pain is ongoing.  She has been ambulatory.  Denies any numbness of the lower extremity.  She has no neck or back pain.  She has otherwise been in her usual state of health.  Past Medical History:  Diagnosis Date  . Anxiety   . Complication of anesthesia    Has increased BP after surgery.  . Constipation   . HLD (hyperlipidemia) 10/26/2018  . Hypercholesteremia   . Hypertension   . Impaired memory    due to severe sleep apnea,on CPAP now;stopped breathing 53.9 times per hour previously on study.  . Sleep apnea   . Viral warts 10/26/2018  . Vitamin D deficiency disease 10/26/2018    Patient Active Problem List   Diagnosis Date Noted  . Malignant neoplasm of urinary bladder ([place]) 05/25/2019  . Vitamin D deficiency disease 10/26/2018  . Viral warts 10/26/2018  . HLD (hyperlipidemia) 10/26/2018  . Iron deficiency anemia 01/22/2014  . Anxiety state 01/16/2014  . Cough 01/16/2014  . Left displaced femoral neck fracture ([place]) 01/08/2014  . Chronic constipation 02/03/2011  . Hypertension 02/03/2011    Past Surgical History:  Procedure Laterality Date  . CATARACT EXTRACTION, BILATERAL    . COLONOSCOPY    . COLONOSCOPY  03/05/2011   Procedure: COLONOSCOPY;  Surgeon: Rogene Houston, MD;  [facility];  Service: Endoscopy;   Laterality: N/A;  1200  . CYSTOSCOPY W/ RETROGRADES Bilateral 08/11/2016   Procedure: CYSTOSCOPY WITH BILATERAL RETROGRADE PYELOGRAM;  Surgeon: Cleon Gustin, MD;  Location: [facility];  Service: Urology;  Laterality: Bilateral;  . HIP ARTHROPLASTY Left 01/08/2014   Procedure: ARTHROPLASTY BIPOLAR HIP;  Surgeon: Marianna Payment, MD;  Location: [place];  Service: Orthopedics;  Laterality: Left;  . TRANSURETHRAL RESECTION OF BLADDER TUMOR N/A 08/11/2016   Procedure: TRANSURETHRAL RESECTION OF BLADDER TUMOR (TURBT);  Surgeon: Cleon Gustin, MD;  Location: [facility];  Service: Urology;  Laterality: N/A;  . TRIGGER FINGER RELEASE Bilateral    ring and middle fingers.     OB History   No obstetric history on file.     Family History  Problem Relation Age of Onset  . Hypertension Mother   . Healthy Daughter   . Colon cancer Neg Hx     Social History   Tobacco Use  . Smoking status: Former Smoker    Packs/day: 0.25    Years: 4.00    Pack years: 1.00    Types: Cigarettes    Quit date: 02/02/1966    Years since quitting: 54.0  . Smokeless tobacco: Never Used  Vaping Use  . Vaping Use: Never used  Substance Use Topics  . Alcohol use: Yes    Alcohol/week: 2.0 standard drinks    Types: 2 Glasses of wine per  week    Comment: Drinks wine  . Drug use: No    Home Medications Prior to Admission medications   Medication Sig Start Date End Date Taking? Authorizing Provider  amLODipine (NORVASC) 5 MG tablet Take 1 tablet by mouth daily. 08/28/15   [provider]  carvedilol (COREG) 6.25 MG tablet TAKE TWO TABLETS BY MOUTH TWICE DAILY 12/15/19   Anastasio Champion, Nimish C, MD  donepezil (ARICEPT) 10 MG tablet Take 10 mg by mouth daily. 06/18/16   [provider]  escitalopram (LEXAPRO) 5 MG tablet TAKE 1 TABLET BY MOUTH EVERY EVENING 10/17/19   Gosrani, Nimish C, MD  hydrochlorothiazide (HYDRODIURIL) 12.5 MG tablet TAKE 1 TABLET BY MOUTH EVERY DAY 10/17/19 01/15/20  Hurshel Party  C, MD  levofloxacin (LEVAQUIN) 500 MG tablet Take 1 tablet (500 mg total) by mouth daily. 07/18/19   Doree Albee, MD  memantine (NAMENDA) 10 MG tablet Take 10 mg by mouth 2 (two) times daily.  06/18/16   [provider]  multivitamin-lutein (OCUVITE-LUTEIN) CAPS capsule Take 1 capsule by mouth daily. 07/18/16   [provider]  NP THYROID 60 MG tablet TAKE 1 TABLET BY MOUTH EVERY DAY 11/16/19 12/16/19  Hurshel Party C, MD  SB LOW DOSE ASA EC 81 MG EC tablet Take 81 mg by mouth daily. 05/23/16   [provider]  simvastatin (ZOCOR) 20 MG tablet TAKE 1 TABLET BY MOUTH EVERY DAY 12/15/19   Doree Albee, MD  thyroid (ARMOUR) 60 MG tablet NP Thyroid 60 mg tablet  TAKE 1 TABLET BY MOUTH EVERY DAY    [provider]    Allergies    Penicillins and Hydrochlorothiazide  Review of Systems   Review of Systems  Constitutional: Negative for chills and fever.  HENT: Negative for congestion.   Respiratory: Negative for shortness of breath.   Cardiovascular: Negative for chest pain.  Gastrointestinal: Negative for abdominal pain, diarrhea and vomiting.  Genitourinary: Negative for dysuria.  Musculoskeletal: Negative for back pain and neck pain.       + Right hip pain  Skin: Negative for rash.  Neurological: Negative for headaches.    Physical Exam Updated Vital Signs BP (!) 173/91   Pulse 74   Temp 98.6 F (37 C)   Resp 18   Ht 5\' 2"  (1.575 m)   Wt 55.3 kg   SpO2 90%   BMI 22.31 kg/m   Physical Exam Vitals and nursing note reviewed.  Constitutional:      Appearance: Normal appearance.  HENT:     Head: Normocephalic.     Mouth/Throat:     Mouth: Mucous membranes are moist.  Cardiovascular:     Rate and Rhythm: Normal rate.  Pulmonary:     Effort: Pulmonary effort is normal. No respiratory distress.  Abdominal:     Palpations: Abdomen is soft.     Tenderness: There is no abdominal tenderness.  Musculoskeletal:     Comments: Tenderness  to palpation of the right hip with no obvious deformity.  Legs are equal length, she is neurovascularly intact.  Skin:    General: Skin is warm.  Neurological:     Mental Status: She is alert and oriented to person, place, and time. Mental status is at baseline.  Psychiatric:        Mood and Affect: Mood normal.     ED Results / Procedures / Treatments   Labs (all labs ordered are listed, but only abnormal results are displayed) Labs Reviewed -  No data to display  EKG None  Radiology No results found.  Procedures Procedures (including critical care time)  Medications Ordered in ED Medications - No data to display  ED Course  I have reviewed the triage vital signs and the nursing notes.  Pertinent labs & imaging results that were available during my care of the patient were reviewed by me and considered in my medical decision making (see chart for details).    MDM Rules/Calculators/A&P                          Patient found to have a right hip fracture.  Pain is being treated.  Orthopedics is aware, patient will be transferred for admission and surgical intervention.  Family is at bedside and understands the plan.  Patients evaluation and results requires admission for further treatment and care. Patient agrees with admission plan, offers no new complaints and is stable/unchanged at time of admit. Final Clinical Impression(s) / ED Diagnoses Final diagnoses:  None    Rx / DC Orders ED Discharge Orders    None       Lorelle Gibbs, DO 01/31/20 1634

## 2020-01-27 NOTE — ED Triage Notes (Signed)
Pt fell down her back steps two days ago. C/o of pain to her right hip with movement. Pedal pulse present. No rotation or deformity noted

## 2020-01-27 NOTE — Anesthesia Preprocedure Evaluation (Addendum)
Anesthesia Evaluation  Patient identified by MRN, date of birth, ID band Patient awake    Reviewed: Allergy & Precautions, NPO status , Patient's Chart, lab work & pertinent test results  History of Anesthesia Complications Negative for: history of anesthetic complications  Airway Mallampati: II  TM Distance: >3 FB Neck ROM: Full    Dental  (+) Dental Advisory Given   Pulmonary sleep apnea (noncompliant) , former smoker,    Pulmonary exam normal        Cardiovascular hypertension, Pt. on medications and Pt. on home beta blockers Normal cardiovascular exam     Neuro/Psych PSYCHIATRIC DISORDERS Anxiety Dementia negative neurological ROS     GI/Hepatic negative GI ROS, Neg liver ROS,   Endo/Other  negative endocrine ROS  Renal/GU negative Renal ROS     Musculoskeletal negative musculoskeletal ROS (+)   Abdominal   Peds  Hematology  Plt 141k    Anesthesia Other Findings Covid test negative   Reproductive/Obstetrics                          Anesthesia Physical Anesthesia Plan  ASA: III  Anesthesia Plan: General   Post-op Pain Management:    Induction: Intravenous  PONV Risk Score and Plan: 3 and Treatment may vary due to age or medical condition, Ondansetron and Propofol infusion  Airway Management Planned: LMA  Additional Equipment: None  Intra-op Plan:   Post-operative Plan: Extubation in OR  Informed Consent: I have reviewed the patients History and Physical, chart, labs and discussed the procedure including the risks, benefits and alternatives for the proposed anesthesia with the patient or authorized representative who has indicated his/her understanding and acceptance.     Dental advisory given and Consent reviewed with POA  Plan Discussed with: CRNA and Anesthesiologist  Anesthesia Plan Comments: (Consent obtained from daughter, Demetress Tift, via telephone )        Anesthesia Quick Evaluation

## 2020-01-27 NOTE — ED Notes (Signed)
Patient transported to X-ray 

## 2020-01-28 ENCOUNTER — Encounter (HOSPITAL_COMMUNITY)
Admission: EM | Disposition: A | Discharge status: Home / Self Care | Payer: Self-pay | Source: Home / Self Care | Attending: Family Medicine

## 2020-01-28 ENCOUNTER — Inpatient Hospital Stay (HOSPITAL_COMMUNITY): Payer: Medicare Other | Admitting: Anesthesiology

## 2020-01-28 ENCOUNTER — Encounter (HOSPITAL_COMMUNITY): Payer: Self-pay | Admitting: Internal Medicine

## 2020-01-28 ENCOUNTER — Inpatient Hospital Stay (HOSPITAL_COMMUNITY): Payer: Medicare Other

## 2020-01-28 HISTORY — PX: TOTAL HIP ARTHROPLASTY: SHX124

## 2020-01-28 LAB — CBC
HCT: 32.2 % — ABNORMAL LOW (ref 36.0–46.0)
Hemoglobin: 10.4 g/dL — ABNORMAL LOW (ref 12.0–15.0)
MCH: 29.4 pg (ref 26.0–34.0)
MCHC: 32.3 g/dL (ref 30.0–36.0)
MCV: 91 fL (ref 80.0–100.0)
Platelets: 101 10*3/uL — ABNORMAL LOW (ref 150–400)
RBC: 3.54 MIL/uL — ABNORMAL LOW (ref 3.87–5.11)
RDW: 12.2 % (ref 11.5–15.5)
WBC: 10.9 10*3/uL — ABNORMAL HIGH (ref 4.0–10.5)
nRBC: 0 % (ref 0.0–0.2)

## 2020-01-28 LAB — TYPE AND SCREEN
ABO/RH(D): A POS
Antibody Screen: NEGATIVE

## 2020-01-28 LAB — SURGICAL PCR SCREEN
MRSA, PCR: NEGATIVE
Staphylococcus aureus: NEGATIVE

## 2020-01-28 LAB — CREATININE, SERUM
Creatinine, Ser: 0.8 mg/dL (ref 0.44–1.00)
GFR, Estimated: >60 mL/min (ref >60)

## 2020-01-28 SURGERY — ARTHROPLASTY, HIP, TOTAL, ANTERIOR APPROACH
Anesthesia: General | Site: Hip | Laterality: Right

## 2020-01-28 MED ORDER — ONDANSETRON HCL 4 MG/2ML IJ SOLN
INTRAMUSCULAR | Status: DC | PRN
  Administered 2020-01-28: 4 mg via INTRAVENOUS

## 2020-01-28 MED ORDER — ENOXAPARIN SODIUM 40 MG/0.4ML ~~LOC~~ SOLN
40.0000 mg | SUBCUTANEOUS | Status: DC
  Administered 2020-01-29 – 2020-01-30 (×2): 40 mg via SUBCUTANEOUS
  Filled 2020-01-28 (×2): qty 0.4

## 2020-01-28 MED ORDER — SODIUM CHLORIDE (PF) 0.9 % IJ SOLN
INTRAMUSCULAR | Status: DC | PRN
  Administered 2020-01-28: 30 mL via INTRAVENOUS

## 2020-01-28 MED ORDER — TRANEXAMIC ACID-NACL 1000-0.7 MG/100ML-% IV SOLN
1000.0000 mg | INTRAVENOUS | Status: AC
  Administered 2020-01-28: 1000 mg via INTRAVENOUS

## 2020-01-28 MED ORDER — CHLORHEXIDINE GLUCONATE 0.12 % MT SOLN
OROMUCOSAL | Status: AC
  Administered 2020-01-28: 15 mL via OROMUCOSAL
  Filled 2020-01-28: qty 15

## 2020-01-28 MED ORDER — FENTANYL CITRATE (PF) 250 MCG/5ML IJ SOLN
INTRAMUSCULAR | Status: AC
  Filled 2020-01-28: qty 5

## 2020-01-28 MED ORDER — PHENOL 1.4 % MT LIQD: 1.0000 | OROMUCOSAL | Status: DC | PRN

## 2020-01-28 MED ORDER — TRANEXAMIC ACID-NACL 1000-0.7 MG/100ML-% IV SOLN
INTRAVENOUS | Status: AC
  Filled 2020-01-28: qty 100

## 2020-01-28 MED ORDER — CEFAZOLIN SODIUM-DEXTROSE 2-4 GM/100ML-% IV SOLN
2.0000 g | INTRAVENOUS | Status: AC
  Administered 2020-01-28: 2 g via INTRAVENOUS
  Filled 2020-01-28: qty 100

## 2020-01-28 MED ORDER — PHENYLEPHRINE HCL-NACL 10-0.9 MG/250ML-% IV SOLN
INTRAVENOUS | Status: DC | PRN
  Administered 2020-01-28: 25 ug/min via INTRAVENOUS

## 2020-01-28 MED ORDER — LIDOCAINE 2% (20 MG/ML) 5 ML SYRINGE
INTRAMUSCULAR | Status: AC
  Filled 2020-01-28: qty 5

## 2020-01-28 MED ORDER — PROPOFOL 10 MG/ML IV BOLUS
INTRAVENOUS | Status: AC
  Filled 2020-01-28: qty 20

## 2020-01-28 MED ORDER — ONDANSETRON HCL 4 MG/2ML IJ SOLN: 4.0000 mg | Freq: Four times a day (QID) | INTRAMUSCULAR | Status: DC | PRN

## 2020-01-28 MED ORDER — POVIDONE-IODINE 10 % EX SWAB: 2.0000 "application " | Freq: Once | CUTANEOUS | Status: DC

## 2020-01-28 MED ORDER — CHLORHEXIDINE GLUCONATE 0.12 % MT SOLN: 15.0000 mL | Freq: Once | OROMUCOSAL | Status: AC

## 2020-01-28 MED ORDER — METOCLOPRAMIDE HCL 5 MG PO TABS
5.0000 mg | ORAL_TABLET | Freq: Three times a day (TID) | ORAL | Status: DC | PRN
Start: 2020-01-28 — End: 2020-01-30

## 2020-01-28 MED ORDER — DOCUSATE SODIUM 100 MG PO CAPS
100.0000 mg | ORAL_CAPSULE | Freq: Two times a day (BID) | ORAL | Status: DC
  Administered 2020-01-28 – 2020-01-30 (×5): 100 mg via ORAL
  Filled 2020-01-28 (×5): qty 1

## 2020-01-28 MED ORDER — OXYCODONE HCL 5 MG PO TABS: 5.0000 mg | ORAL_TABLET | Freq: Once | ORAL | Status: DC | PRN

## 2020-01-28 MED ORDER — CHLORHEXIDINE GLUCONATE 4 % EX LIQD: 60.0000 mL | Freq: Once | CUTANEOUS | Status: DC

## 2020-01-28 MED ORDER — BUPIVACAINE HCL 0.5 % IJ SOLN
INTRAMUSCULAR | Status: AC
  Filled 2020-01-28: qty 1

## 2020-01-28 MED ORDER — 0.9 % SODIUM CHLORIDE (POUR BTL) OPTIME
TOPICAL | Status: DC | PRN
  Administered 2020-01-28: 1000 mL

## 2020-01-28 MED ORDER — FENTANYL CITRATE (PF) 250 MCG/5ML IJ SOLN
INTRAMUSCULAR | Status: DC | PRN
  Administered 2020-01-28 (×2): 50 ug via INTRAVENOUS

## 2020-01-28 MED ORDER — FENTANYL CITRATE (PF) 100 MCG/2ML IJ SOLN
25.0000 ug | INTRAMUSCULAR | Status: DC | PRN
Start: 2020-01-28 — End: 2020-01-28

## 2020-01-28 MED ORDER — ONDANSETRON HCL 4 MG/2ML IJ SOLN: 4.0000 mg | Freq: Once | INTRAMUSCULAR | Status: DC | PRN

## 2020-01-28 MED ORDER — DEXAMETHASONE SODIUM PHOSPHATE 10 MG/ML IJ SOLN
INTRAMUSCULAR | Status: AC
  Filled 2020-01-28: qty 1

## 2020-01-28 MED ORDER — LACTATED RINGERS IV SOLN: INTRAVENOUS | Status: DC

## 2020-01-28 MED ORDER — LIDOCAINE 2% (20 MG/ML) 5 ML SYRINGE
INTRAMUSCULAR | Status: DC | PRN
  Administered 2020-01-28: 40 mg via INTRAVENOUS

## 2020-01-28 MED ORDER — SODIUM CHLORIDE 0.9 % IR SOLN
Status: DC | PRN
  Administered 2020-01-28: 3000 mL
  Administered 2020-01-28: 500 mL

## 2020-01-28 MED ORDER — MENTHOL 3 MG MT LOZG: 1.0000 | LOZENGE | OROMUCOSAL | Status: DC | PRN

## 2020-01-28 MED ORDER — CEFAZOLIN SODIUM-DEXTROSE 2-4 GM/100ML-% IV SOLN
2.0000 g | Freq: Four times a day (QID) | INTRAVENOUS | Status: AC
Start: 2020-01-28 — End: 2020-01-28
  Administered 2020-01-28 (×2): 2 g via INTRAVENOUS
  Filled 2020-01-28 (×2): qty 100

## 2020-01-28 MED ORDER — PROPOFOL 500 MG/50ML IV EMUL
INTRAVENOUS | Status: DC | PRN
  Administered 2020-01-28: 20 ug/kg/min via INTRAVENOUS

## 2020-01-28 MED ORDER — EPINEPHRINE PF 1 MG/ML IJ SOLN
INTRAMUSCULAR | Status: AC
  Filled 2020-01-28: qty 1

## 2020-01-28 MED ORDER — PROPOFOL 10 MG/ML IV BOLUS
INTRAVENOUS | Status: DC | PRN
  Administered 2020-01-28: 80 mg via INTRAVENOUS

## 2020-01-28 MED ORDER — OXYCODONE HCL 5 MG/5ML PO SOLN
5.0000 mg | Freq: Once | ORAL | Status: DC | PRN
Start: 2020-01-28 — End: 2020-01-28

## 2020-01-28 MED ORDER — KETOROLAC TROMETHAMINE 30 MG/ML IJ SOLN
INTRAMUSCULAR | Status: DC | PRN
  Administered 2020-01-28: 30 mg via INTRAVENOUS

## 2020-01-28 MED ORDER — EPHEDRINE 5 MG/ML INJ
INTRAVENOUS | Status: AC
  Filled 2020-01-28: qty 10

## 2020-01-28 MED ORDER — EPHEDRINE SULFATE-NACL 50-0.9 MG/10ML-% IV SOSY
PREFILLED_SYRINGE | INTRAVENOUS | Status: DC | PRN
  Administered 2020-01-28: 10 mg via INTRAVENOUS

## 2020-01-28 MED ORDER — BUPIVACAINE-EPINEPHRINE 0.5% -1:200000 IJ SOLN
30.0000 mL | Freq: Once | INTRAMUSCULAR | Status: DC
  Filled 2020-01-28: qty 30

## 2020-01-28 MED ORDER — METOCLOPRAMIDE HCL 5 MG/ML IJ SOLN: 5.0000 mg | Freq: Three times a day (TID) | INTRAMUSCULAR | Status: DC | PRN

## 2020-01-28 MED ORDER — ONDANSETRON HCL 4 MG/2ML IJ SOLN
INTRAMUSCULAR | Status: AC
  Filled 2020-01-28: qty 2

## 2020-01-28 MED ORDER — ALBUMIN HUMAN 5 % IV SOLN: INTRAVENOUS | Status: DC | PRN

## 2020-01-28 MED ORDER — ONDANSETRON HCL 4 MG PO TABS: 4.0000 mg | ORAL_TABLET | Freq: Four times a day (QID) | ORAL | Status: DC | PRN

## 2020-01-28 MED ORDER — KETOROLAC TROMETHAMINE 30 MG/ML IJ SOLN
INTRAMUSCULAR | Status: AC
  Filled 2020-01-28: qty 1

## 2020-01-28 MED ORDER — PROSIGHT PO TABS
1.0000 | ORAL_TABLET | Freq: Every day | ORAL | Status: DC
  Administered 2020-01-29 – 2020-01-30 (×2): 1 via ORAL
  Filled 2020-01-28 (×2): qty 1

## 2020-01-28 MED ORDER — BUPIVACAINE-EPINEPHRINE (PF) 0.5% -1:200000 IJ SOLN
INTRAMUSCULAR | Status: DC | PRN
  Administered 2020-01-28: 30 mL via PERINEURAL

## 2020-01-28 SURGICAL SUPPLY — 62 items
ALCOHOL 70% 16 OZ (MISCELLANEOUS) ×2 IMPLANT
CHLORAPREP W/TINT 26 (MISCELLANEOUS) ×2 IMPLANT
COVER SURGICAL LIGHT HANDLE (MISCELLANEOUS) ×2 IMPLANT
COVER WAND RF STERILE (DRAPES) ×2 IMPLANT
CUP SECTOR GRIPTON 50MM (Cup) ×2 IMPLANT
DERMABOND ADHESIVE PROPEN (GAUZE/BANDAGES/DRESSINGS) ×1
DERMABOND ADVANCED (GAUZE/BANDAGES/DRESSINGS) ×2
DERMABOND ADVANCED .7 DNX12 (GAUZE/BANDAGES/DRESSINGS) ×2 IMPLANT
DERMABOND ADVANCED .7 DNX6 (GAUZE/BANDAGES/DRESSINGS) ×1 IMPLANT
DRAPE C-ARM 42X72 X-RAY (DRAPES) ×2 IMPLANT
DRAPE STERI IOBAN 125X83 (DRAPES) ×2 IMPLANT
DRAPE U-SHAPE 47X51 STRL (DRAPES) ×6 IMPLANT
DRSG AQUACEL AG ADV 3.5X10 (GAUZE/BANDAGES/DRESSINGS) ×2 IMPLANT
ELECT BLADE 4.0 EZ CLEAN MEGAD (MISCELLANEOUS) ×2
ELECT PENCIL ROCKER SW 15FT (MISCELLANEOUS) ×2 IMPLANT
ELECT REM PT RETURN 9FT ADLT (ELECTROSURGICAL) ×2
ELECTRODE BLDE 4.0 EZ CLN MEGD (MISCELLANEOUS) ×1 IMPLANT
ELECTRODE REM PT RTRN 9FT ADLT (ELECTROSURGICAL) ×1 IMPLANT
GLOVE BIO SURGEON STRL SZ8.5 (GLOVE) ×4 IMPLANT
GLOVE BIOGEL M 7.0 STRL (GLOVE) ×2 IMPLANT
GLOVE BIOGEL PI IND STRL 7.5 (GLOVE) ×1 IMPLANT
GLOVE BIOGEL PI IND STRL 8.5 (GLOVE) ×1 IMPLANT
GLOVE BIOGEL PI INDICATOR 7.5 (GLOVE) ×1
GLOVE BIOGEL PI INDICATOR 8.5 (GLOVE) ×1
GOWN STRL REUS W/ TWL LRG LVL3 (GOWN DISPOSABLE) ×2 IMPLANT
GOWN STRL REUS W/ TWL XL LVL3 (GOWN DISPOSABLE) ×1 IMPLANT
GOWN STRL REUS W/TWL 2XL LVL3 (GOWN DISPOSABLE) ×2 IMPLANT
GOWN STRL REUS W/TWL LRG LVL3 (GOWN DISPOSABLE) ×2
GOWN STRL REUS W/TWL XL LVL3 (GOWN DISPOSABLE) ×1
HANDPIECE INTERPULSE COAX TIP (DISPOSABLE) ×1
HEAD FEM STD 32X+1 STRL (Hips) ×2 IMPLANT
HOOD PEEL AWAY FACE SHEILD DIS (HOOD) ×4 IMPLANT
JET LAVAGE IRRISEPT WOUND (IRRIGATION / IRRIGATOR) ×2
KIT BASIN OR (CUSTOM PROCEDURE TRAY) ×2 IMPLANT
KIT TURNOVER KIT B (KITS) ×2 IMPLANT
LAVAGE JET IRRISEPT WOUND (IRRIGATION / IRRIGATOR) ×1 IMPLANT
LINER ACETABULAR 32X50 (Liner) ×2 IMPLANT
MANIFOLD NEPTUNE II (INSTRUMENTS) ×2 IMPLANT
MARKER SKIN DUAL TIP RULER LAB (MISCELLANEOUS) ×4 IMPLANT
NEEDLE SPNL 18GX3.5 QUINCKE PK (NEEDLE) ×2 IMPLANT
NS IRRIG 1000ML POUR BTL (IV SOLUTION) ×2 IMPLANT
PACK TOTAL JOINT (CUSTOM PROCEDURE TRAY) ×2 IMPLANT
PACK UNIVERSAL I (CUSTOM PROCEDURE TRAY) ×2 IMPLANT
PAD ARMBOARD 7.5X6 YLW CONV (MISCELLANEOUS) ×4 IMPLANT
SAW OSC TIP CART 19.5X105X1.3 (SAW) ×2 IMPLANT
SEALER BIPOLAR AQUA 6.0 (INSTRUMENTS) ×2 IMPLANT
SET HNDPC FAN SPRY TIP SCT (DISPOSABLE) ×1 IMPLANT
SOL PREP POV-IOD 4OZ 10% (MISCELLANEOUS) ×2 IMPLANT
STEM TRI LOC BPS GRIPTON SZ 5 (Hips) ×1 IMPLANT
SUT ETHIBOND NAB CT1 #1 30IN (SUTURE) ×4 IMPLANT
SUT MNCRL AB 3-0 PS2 18 (SUTURE) ×2 IMPLANT
SUT MON AB 2-0 CT1 36 (SUTURE) ×2 IMPLANT
SUT VIC AB 1 CT1 27 (SUTURE) ×1
SUT VIC AB 1 CT1 27XBRD ANBCTR (SUTURE) ×1 IMPLANT
SUT VIC AB 2-0 CT1 27 (SUTURE) ×1
SUT VIC AB 2-0 CT1 TAPERPNT 27 (SUTURE) ×1 IMPLANT
SUT VLOC 180 0 24IN GS25 (SUTURE) ×2 IMPLANT
SYR 50ML LL SCALE MARK (SYRINGE) ×2 IMPLANT
TOWEL GREEN STERILE (TOWEL DISPOSABLE) ×2 IMPLANT
TOWEL GREEN STERILE FF (TOWEL DISPOSABLE) ×2 IMPLANT
TRI LOC BPS W GRIPTON SZ 5 (Hips) ×2 IMPLANT
WATER STERILE IRR 1000ML POUR (IV SOLUTION) ×6 IMPLANT

## 2020-01-28 NOTE — Anesthesia Procedure Notes (Signed)
Procedure Name: LMA Insertion Date/Time: 01/28/2020 8:35 AM Performed by: Kyung Rudd, CRNA Pre-anesthesia Checklist: Patient identified, Emergency Drugs available, Suction available and Patient being monitored Patient Re-evaluated:Patient Re-evaluated prior to induction Oxygen Delivery Method: Circle system utilized Preoxygenation: Pre-oxygenation with 100% oxygen Induction Type: IV induction LMA: LMA inserted LMA Size: 4.0 Number of attempts: 1 Placement Confirmation: positive ETCO2 and breath sounds checked- equal and bilateral Tube secured with: Tape Dental Injury: Teeth and Oropharynx as per pre-operative assessment

## 2020-01-28 NOTE — Op Note (Signed)
OPERATIVE REPORT  SURGEON: Rod Can, MD   ASSISTANT: Staff.  PREOPERATIVE DIAGNOSIS: Displaced Right femoral neck fracture.   POSTOPERATIVE DIAGNOSIS: Displaced Right femoral neck fracture.   PROCEDURE: Right total hip arthroplasty, anterior approach.   IMPLANTS: DePuy Tri Lock stem, size 5, hi offset. DePuy Pinnacle Cup, size 50 mm. DePuy Altrx liner, size 32 by 50 mm, neutral. DePuy metal head ball, size 32 + 1 mm.  ANESTHESIA:  General  ANTIBIOTICS: 2g ancef.  ESTIMATED BLOOD LOSS:-250 mL    DRAINS: None.  COMPLICATIONS: None   CONDITION: PACU - hemodynamically stable.   BRIEF CLINICAL NOTE: Jackie Phelps is a 87 y.o. female with a displaced Right femoral neck fracture. The patient was admitted to the hospitalist service and underwent perioperative risk stratification and medical optimization. The risks, benefits, and alternatives to total hip arthroplasty were explained, and the patient elected to proceed.  PROCEDURE IN DETAIL: The patient was taken to the operating room and general anesthesia was induced on the hospital bed.  The patient was then positioned on the Hana table.  All bony prominences were well padded.  The hip was prepped and draped in the normal sterile surgical fashion.  A time-out was called verifying side and site of surgery. Antibiotics were given within 60 minutes of beginning the procedure.  The direct anterior approach to the hip was performed through the Hueter interval.  Lateral femoral circumflex vessels were treated with the Auqumantys. The anterior capsule was exposed and an inverted T capsulotomy was made.  Fracture hematoma was encountered and evacuated. The patient was found to have a comminuted Right subcapital femoral neck fracture.  I freshened the femoral neck cut with a saw.  I removed the femoral neck fragment.  A corkscrew was placed into the head and the head was removed.  This was passed to the back table and was measured.    Acetabular exposure was achieved, and the pulvinar and labrum were excised. Sequential reaming of the acetabulum was then performed up to a size 49 mm reamer. A 50 mm cup was then opened and impacted into place at approximately 40 degrees of abduction and 20 degrees of anteversion. The final polyethylene liner was impacted into place.    I then gained femoral exposure taking care to protect the abductors and greater trochanter.  This was performed using standard external rotation, extension, and adduction.  The capsule was peeled off the inner aspect of the greater trochanter, taking care to preserve the short external rotators. A cookie cutter was used to enter the femoral canal, and then the femoral canal finder was used to confirm location.  I then sequentially broached up to a size 5.  Calcar planer was used on the femoral neck remnant.  I paced a hi neck and a trial head ball. The hip was reduced.  Leg lengths were checked fluoroscopically.  The hip was dislocated and trial components were removed.  I placed the real stem followed by the real head ball.  The hip was reduced.  Fluoroscopy was used to confirm component position and leg lengths.  At 90 degrees of external rotation and extension, the hip was stable to an anterior directed force.   The wound was copiously irrigated with Irrisept solution and normal saline using pule lavage.  Marcaine solution was injected into the periarticular soft tissue.  The wound was closed in layers using #1 Vicryl and V-Loc for the fascia, 2-0 Vicryl for the subcutaneous fat, 2-0 Monocryl for the deep  dermal layer, and staples plus glue for the skin.  Once the glue was fully dried, an Aquacell Ag dressing was applied.  The patient was then awakened from anesthesia and transported to the recovery room in stable condition.  Sponge, needle, and instrument counts were correct at the end of the case x2.  The patient tolerated the procedure well and there were no known  complications.

## 2020-01-28 NOTE — Consult Note (Signed)
ORTHOPAEDIC CONSULTATION  REQUESTING PHYSICIAN: Flora Lipps, MD  PCP:  Doree Albee, MD  Chief Complaint: Right hip pain  HPI: Jackie Phelps is a 87 y.o. female with medical history significant of impaired memory/dementia (mild), anxiety/depression, hypertension, hyperlipidemia, sleep apnea vitamin D deficiency who presented to the emergency department at [facility] after a mechanical fall at her house. She had right hip pain and inability to weight-bear. X-rays revealed a displaced right femoral neck fracture. She was admitted to the hospitalist service for perioperative risk ratification and medical optimization. Orthopedic consultation was placed for management of the hip fracture. She denies other injuries.  Past Medical History:  Diagnosis Date  . Anxiety   . Complication of anesthesia    Has increased BP after surgery.  . Constipation   . HLD (hyperlipidemia) 10/26/2018  . Hypercholesteremia   . Hypertension   . Impaired memory    due to severe sleep apnea,on CPAP now;stopped breathing 53.9 times per hour previously on study.  . Sleep apnea   . Viral warts 10/26/2018  . Vitamin D deficiency disease 10/26/2018   Past Surgical History:  Procedure Laterality Date  . CATARACT EXTRACTION, BILATERAL    . COLONOSCOPY    . COLONOSCOPY  03/05/2011   Procedure: COLONOSCOPY;  Surgeon: Rogene Houston, MD;  Location: [facility];  Service: Endoscopy;  Laterality: N/A;  1200  . CYSTOSCOPY W/ RETROGRADES Bilateral 08/11/2016   Procedure: CYSTOSCOPY WITH BILATERAL RETROGRADE PYELOGRAM;  Surgeon: Cleon Gustin, MD;  Location: [facility];  Service: Urology;  Laterality: Bilateral;  . HIP ARTHROPLASTY Left 01/08/2014   Procedure: ARTHROPLASTY BIPOLAR HIP;  Surgeon: Marianna Payment, MD;  Location: [place];  Service: Orthopedics;  Laterality: Left;  . TRANSURETHRAL RESECTION OF BLADDER TUMOR N/A 08/11/2016   Procedure: TRANSURETHRAL RESECTION OF BLADDER TUMOR (TURBT);   Surgeon: Cleon Gustin, MD;  Location: [facility];  Service: Urology;  Laterality: N/A;  . TRIGGER FINGER RELEASE Bilateral    ring and middle fingers.   Social History   Socioeconomic History  . Marital status: Married    Spouse name: Not on file  . Number of children: Not on file  . Years of education: Not on file  . Highest education level: Not on file  Occupational History  . Not on file  Tobacco Use  . Smoking status: Former Smoker    Packs/day: 0.25    Years: 4.00    Pack years: 1.00    Types: Cigarettes    Quit date: 02/02/1966    Years since quitting: 54.0  . Smokeless tobacco: Never Used  Vaping Use  . Vaping Use: Never used  Substance and Sexual Activity  . Alcohol use: Yes    Alcohol/week: 2.0 standard drinks    Types: 2 Glasses of wine per week    Comment: Drinks wine  . Drug use: No  . Sexual activity: Not Currently    Birth control/protection: Post-menopausal  Other Topics Concern  . Not on file  Social History Narrative   Married 60 years.  Lives with husband.  Non-smoker.   Social Determinants of Health   Financial Resource Strain: Not on file  Food Insecurity: Not on file  Transportation Needs: Not on file  Physical Activity: Not on file  Stress: Not on file  Social Connections: Not on file   Family History  Problem Relation Age of Onset  . Hypertension Mother   . Healthy Daughter   . Colon cancer Neg  Hx    Allergies  Allergen Reactions  . Penicillins Rash and Hives    Has patient had a PCN reaction causing immediate rash, facial/tongue/throat swelling, SOB or lightheadedness with hypotension: unknown Has patient had a PCN reaction causing severe rash involving mucus membranes or skin necrosis: unknown Has patient had a PCN reaction that required hospitalization: unknown Has patient had a PCN reaction occurring within the last 10 years: unknown If all of the above answers are "NO", then may proceed with Cephalosporin use. Has patient had  a PCN reaction causing immediate rash, facial/tongue/throat swelling, SOB or lightheadedness with hypotension: unknown Has patient had a PCN reaction causing severe rash involving mucus membranes or skin necrosis: unknown Has patient had a PCN reaction that required hospitalization: unknown Has patient had a PCN reaction occurring within the last 10 years: unknown If all of the above answers are "NO", then may proceed with Cephalosporin use.  Has patient had a PCN reaction causing immediate rash, facial/tongue/throat swelling, SOB or lightheadedness with hypotension: unknown Has patient had a PCN reaction causing severe rash involving mucus membranes or skin necrosis: unknown Has patient had a PCN reaction that required hospitalization: unknown Has patient had a PCN reaction occurring within the last 10 years: unknown If all of the above answers are "NO", then may proceed with Cephalosporin use.  Marland Kitchen Hydrochlorothiazide Other (See Comments)    Dehydration   Prior to Admission medications   Medication Sig Start Date End Date Taking? Authorizing Provider  carvedilol (COREG) 6.25 MG tablet TAKE TWO TABLETS BY MOUTH TWICE DAILY Patient taking differently: Take 12.5 mg by mouth 2 (two) times daily with a meal. 12/15/19  Yes Gosrani, Nimish C, MD  donepezil (ARICEPT) 10 MG tablet Take 10 mg by mouth daily. 06/18/16  Yes [provider]  escitalopram (LEXAPRO) 5 MG tablet TAKE 1 TABLET BY MOUTH EVERY EVENING Patient taking differently: Take 5 mg by mouth daily. 10/17/19  Yes Gosrani, Nimish C, MD  hydrochlorothiazide (HYDRODIURIL) 12.5 MG tablet TAKE 1 TABLET BY MOUTH EVERY DAY Patient taking differently: Take 12.5 mg by mouth daily. 10/17/19 01/15/20 Yes Gosrani, Nimish C, MD  memantine (NAMENDA) 10 MG tablet Take 10 mg by mouth 2 (two) times daily.  06/18/16  Yes [provider]  multivitamin-lutein (OCUVITE-LUTEIN) CAPS capsule Take 1 capsule by mouth daily. 07/18/16  Yes [provider]  NP THYROID 60 MG tablet TAKE 1 TABLET BY MOUTH EVERY DAY Patient taking differently: Take 60 mg by mouth daily. 11/16/19 12/16/19 Yes Gosrani, Nimish C, MD  SB LOW DOSE ASA EC 81 MG EC tablet Take 81 mg by mouth daily. 05/23/16  Yes [provider]  simvastatin (ZOCOR) 20 MG tablet TAKE 1 TABLET BY MOUTH EVERY DAY Patient taking differently: Take 20 mg by mouth daily at 12 noon. 12/15/19  Yes Doree Albee, MD   Chest Portable 1 View  Result Date: 01/27/2020 CLINICAL DATA:  Preoperative assessment EXAM: PORTABLE CHEST 1 VIEW COMPARISON:  January 18, 2014. FINDINGS: Lungs are clear. Heart size and pulmonary vascularity are normal. No adenopathy. There is aortic atherosclerosis. No bone lesions. IMPRESSION: Lungs clear.  Cardiac silhouette normal. Aortic Atherosclerosis (ICD10-I70.0). Electronically Signed   By: Lowella Grip III M.D.   On: 01/27/2020 14:33   DG Hip Unilat With Pelvis 2-3 Views Right  Result Date: 01/27/2020 CLINICAL DATA:  Fall.  Right hip pain. EXAM: DG HIP (WITH OR WITHOUT PELVIS) 2-3V RIGHT COMPARISON:  06/09/2016 CT.  01/26/2014 radiographs of the  pelvis. FINDINGS: Deformity of the right femoral head/neck junction, most consistent with impacted subcapital fracture. No dislocation. Left hip arthroplasty. IMPRESSION: Right femoral neck fracture, likely subcapital. Electronically Signed   By: Abigail Miyamoto M.D.   On: 01/27/2020 12:56    Positive ROS: All other systems have been reviewed and were otherwise negative with the exception of those mentioned in the HPI and as above.  Physical Exam: General: Alert, no acute distress Cardiovascular: No pedal edema Respiratory: No cyanosis, no use of accessory musculature GI: No organomegaly, abdomen is soft and non-tender Skin: No lesions in the area of chief complaint Neurologic: Sensation intact distally Psychiatric: Pleasantly confused Lymphatic: No axillary or cervical  lymphadenopathy  MUSCULOSKELETAL: Examination of the right hip reveals no skin wounds or lesions. She is shortened and externally rotated. She has pain with logrolling of the hip. She has positive motor function dorsiflexion, plantarflexion, and great toe extension. She has palpable pedal pulses. Sensation is intact.  Assessment: Displaced right femoral neck fracture  Plan: I discussed the findings with the patient and her daughter, who is the POA. She has an unstable right hip fracture. To allow for pain control and immediate mobilization out of bed, I recommend right total hip arthroplasty. We discussed the risk, benefits, and alternatives. Please see statement of risk. Plan for surgery today. All questions were solicited and answered.    Bertram Savin, MD [phone]    01/28/2020 8:17 AM

## 2020-01-28 NOTE — Transfer of Care (Signed)
Immediate Anesthesia Transfer of Care Note  Patient: Jackie Phelps  Procedure(s) Performed: TOTAL HIP ARTHROPLASTY ANTERIOR APPROACH (Right Hip)  Patient Location: PACU  Anesthesia Type:General  Level of Consciousness: awake, alert  and oriented  Airway & Oxygen Therapy: Patient Spontanous Breathing and Patient connected to nasal cannula oxygen  Post-op Assessment: Report given to RN, Post -op Vital signs reviewed and stable and Patient moving all extremities X 4  Post vital signs: Reviewed and stable  Last Vitals:  Vitals Value Taken Time  BP 156/69 01/28/20 1016  Temp 36.3 C 01/28/20 1015  Pulse 65 01/28/20 1021  Resp 13 01/28/20 1021  SpO2 97 % 01/28/20 1021  Vitals shown include unvalidated device data.  Last Pain:  Vitals:   01/28/20 1015  TempSrc:   PainSc: 0-No pain      Patients Stated Pain Goal: 1 (24/23/53 6144)  Complications: No complications documented.

## 2020-01-28 NOTE — Plan of Care (Signed)
  Problem: Clinical Measurements: Goal: Diagnostic test results will improve Outcome: Progressing Goal: Respiratory complications will improve Outcome: Progressing Goal: Cardiovascular complication will be avoided Outcome: Progressing   

## 2020-01-28 NOTE — Progress Notes (Signed)
PT Cancellation Note  Patient Details Name: Jackie Phelps MRN: [id number] DOB: [birth year]   Cancelled Treatment:    Reason Eval/Treat Not Completed: Patient at procedure or test/unavailable Patient off unit for R THA this AM. PT will re-attempt eval following surgery.   Dionis Autry A. Gilford Rile PT, DPT Acute Rehabilitation Services Pager [phone] Office [phone]    Alda Lea 01/28/2020, 7:48 AM

## 2020-01-28 NOTE — Evaluation (Signed)
Occupational Therapy Evaluation Patient Details Name: Jackie Phelps MRN: [id number] DOB: Dec 08, 1931 Today's Date: 01/28/2020    History of Present Illness 87 y.o. female with medical history significant of impaired memory/dementia (mild), anxiety/depression, hypertension, hyperlipidemia, sleep apnea vitamin D deficiency; presented to the emergency department complaining of right hip pain.  Underwent Right total hip arthroplasty, anterior approach 1/22.   Clinical Impression   Patient admitted for the procedure above after a fall at home.  She missed a step on a back deck.  Baseline dementia noted with decreased awareness of deficits, and memory deficits.  Daughter stays with the parents full time, and will be there 24/7 to help keep her parents in their home as long as possible.  Of note, the husband recently returned from [facility] after a fall and fracture of his own.  He will be starting CA treatments in a few weeks, so she is concerned regarding  Her mother being home alone at times. [place] OT will initially be recommended, but she may need SNF if the daugther cannot arrange assist at home when she assisting her father with CA treatments.  The daughter will be transporting him to [facility] on those days.  OT will follow in the acute setting.      Follow Up Recommendations  Home health OT    Equipment Recommendations  3 in 1 bedside commode    Recommendations for Other Services       Precautions / Restrictions Precautions Precautions: Anterior Hip Precaution Comments: baseline dementia Restrictions Weight Bearing Restrictions: Yes      Mobility Bed Mobility Overal bed mobility: Needs Assistance Bed Mobility: Supine to Sit     Supine to sit: Min assist          Transfers Overall transfer level: Needs assistance   Transfers: Sit to/from Stand;Stand Pivot Transfers Sit to Stand: Min assist Stand pivot transfers: Min assist            Balance Overall balance assessment:  Needs assistance Sitting-balance support: No upper extremity supported;Feet supported Sitting balance-Leahy Scale: Good     Standing balance support: Bilateral upper extremity supported Standing balance-Leahy Scale: Poor Standing balance comment: need RW                           ADL either performed or assessed with clinical judgement   ADL Overall ADL's : Needs assistance/impaired     Grooming: Wash/dry hands;Wash/dry face;Supervision/safety;Sitting   Upper Body Bathing: Supervision/ safety;Sitting   Lower Body Bathing: Moderate assistance;Sit to/from stand   Upper Body Dressing : Supervision/safety;Sitting   Lower Body Dressing: Moderate assistance;Sit to/from stand   Toilet Transfer: Minimal assistance;RW           Functional mobility during ADLs: Minimal assistance;Rolling walker;Cueing for safety;Cueing for sequencing       Vision Patient Visual Report: No change from baseline       Perception     Praxis      Pertinent Vitals/Pain Pain Assessment: Faces Faces Pain Scale: Hurts little more Pain Location: R hip Pain Descriptors / Indicators: Tender Pain Intervention(s): Monitored during session     Hand Dominance Right   Extremity/Trunk Assessment Upper Extremity Assessment Upper Extremity Assessment: Overall WFL for tasks assessed   Lower Extremity Assessment Lower Extremity Assessment: Defer to PT evaluation   Cervical / Trunk Assessment Cervical / Trunk Assessment: Normal   Communication Communication Communication: No difficulties   Cognition Arousal/Alertness: Awake/alert Behavior During Therapy: Restless  Overall Cognitive Status: History of cognitive impairments - at baseline                                 General Comments: forgetfulness and decreased awareness of deficits.  cues for safety.   General Comments       Exercises     Shoulder Instructions      Home Living Family/patient expects to be  discharged to:: Private residence Living Arrangements: Spouse/significant other;Children Available Help at Discharge: Family;Available 24 hours/day Type of Home: House Home Access: Stairs to enter [employer] of Steps: 2   Home Layout: Multi-level;Bed/bath upstairs Alternate Level Stairs-Number of Steps: 20 Alternate Level Stairs-Rails: Left Bathroom Shower/Tub: Tub/shower unit   Bathroom Toilet: Handicapped height     Home Equipment: Tub bench   Additional Comments: All DME is spouses, patient does not have any DME      Prior Functioning/Environment Level of Independence: Independent                 OT Problem List: Decreased activity tolerance;Impaired balance (sitting and/or standing);Decreased knowledge of use of DME or AE;Decreased safety awareness;Pain      OT Treatment/Interventions: Self-care/ADL training;Therapeutic exercise;Therapeutic activities;Balance training;Patient/family education    OT Goals(Current goals can be found in the care plan section) Acute Rehab OT Goals Patient Stated Goal: return home OT Goal Formulation: With patient Time For Goal Achievement: 02/11/20 Potential to Achieve Goals: Good  OT Frequency: Min 2X/week   Barriers to D/C:    none noted       Co-evaluation PT/OT/SLP Co-Evaluation/Treatment: Yes Reason for Co-Treatment: For patient/therapist safety   OT goals addressed during session: ADL's and self-care      AM-PAC OT "6 Clicks" Daily Activity     Outcome Measure Help from another person eating meals?: None Help from another person taking care of personal grooming?: A Little Help from another person toileting, which includes using toliet, bedpan, or urinal?: A Little Help from another person bathing (including washing, rinsing, drying)?: A Little Help from another person to put on and taking off regular upper body clothing?: None Help from another person to put on and taking off regular lower body clothing?:  A Little 6 Click Score: 20   End of Session Equipment Utilized During Treatment: Gait belt;Rolling walker;Oxygen Nurse Communication: Mobility status  Activity Tolerance: Patient tolerated treatment well Patient left: in chair;with call bell/phone within reach;with nursing/sitter in room  OT Visit Diagnosis: Unsteadiness on feet (R26.81);Pain Pain - Right/Left: Right Pain - part of body: Leg                Time: 1356-1440 OT Time Calculation (min): 44 min Charges:  OT General Charges $OT Visit: 1 Visit OT Evaluation $OT Eval Moderate Complexity: 1 Mod OT Treatments $Self Care/Home Management : 8-22 mins  01/28/2020  Rich, OTR/L  Acute Rehabilitation Services  Office:  [phone]   Metta Clines 01/28/2020, 2:48 PM

## 2020-01-28 NOTE — Progress Notes (Signed)
PROGRESS NOTE  Jackie HUIE BZJ:[id number] DOB: [birth year] DOA: 01/27/2020 PCP: Doree Albee, MD  HPI/Recap of past 24 hours: 87 y.o. female with medical history significant of impaired memory/dementia (mild), anxiety/depression, hypertension, hyperlipidemia, sleep apnea vitamin D deficiency; presented to the emergency department complaining of right hip pain and inability to bear weight x-ray was done and it showed displaced right femoral neck fracture . She underwent Right total hip arthroplasty, anterior approach 1/22.  Subjective: January 28, 2020: Patient seen and examined at bedside she has just returned from surgery. She fell and had a fracture of the right hip  Assessment/Plan: Active Problems:   Other fracture of right femur, initial encounter for closed fracture ([place]) #1 displaced femoral neck plan fracture. Status post right total hip arthroplasty.  2. Hypertension uncontrolled we will resume home medication for blood pressure 3. History of her Alzheimer's will continue Aricept and Namenda  4. Depression and anxiety continue Lexapro  5. History of obstructive sleep apnea. Continue CPAP per home  6. Mild reactive leukocytosis we will continue to monitor  7. Mild hypokalemia it has been replaced will recheck level  Code Status: Full  Severity of Illness: The appropriate patient status for this patient is INPATIENT. Inpatient status is judged to be reasonable and necessary in order to provide the required intensity of service to ensure the patient's safety. The patient's presenting symptoms, physical exam findings, and initial radiographic and laboratory data in the context of their chronic comorbidities is felt to place them at high risk for further clinical deterioration. Furthermore, it is not anticipated that the patient will be medically stable for discharge from the hospital within 2 midnights of admission. The following factors support the patient status of  inpatient.   " The patient's presenting symptoms include displaced right hip fracture. " The worrisome physical exam findings include inability to bear weight. " The initial radiographic and laboratory data are worrisome because of displaced right hip fracture. " The chronic co-morbidities include mild dementia.   * I certify that at the point of admission it is my clinical judgment that the patient will require inpatient hospital care spanning beyond 2 midnights from the point of admission due to high intensity of service, high risk for further deterioration and high frequency of surveillance required.*    Family Communication: Self  Disposition Plan: For rehab   Consultants:  Orthopedic  Procedures:  Right total hip arthroplasty  Antimicrobials:  None  DVT prophylaxis: Heparin   Objective: Vitals:   01/28/20 1104 01/28/20 1158 01/28/20 1319 01/28/20 1724  BP: (!) 172/67 (!) 151/64 (!) 168/71 (!) 148/54  Pulse: 64 60 68 72  Resp: 17 17 17 17   Temp: (!) 97.4 F (36.3 C) 97.7 F (36.5 C) 98.7 F (37.1 C) 98.4 F (36.9 C)  TempSrc: Oral Oral Oral Oral  SpO2: 99% 100% 100% (!) 89%  Weight:      Height:        Intake/Output Summary (Last 24 hours) at 01/28/2020 1734 Last data filed at 01/28/2020 1600 Gross per 24 hour  Intake 1450 ml  Output 250 ml  Net 1200 ml   Filed Weights   01/27/20 1051  Weight: 55.3 kg   Body mass index is 22.31 kg/m.  Exam:  . General: 87 y.o. year-old female well developed well nourished in no acute distress.  Alert and oriented x3. Pleasant. Not in acute distress . Cardiovascular: Regular rate and rhythm with no rubs or gallops.  No thyromegaly or  JVD noted.   Marland Kitchen Respiratory: Clear to auscultation with no wheezes or rales. Good inspiratory effort. . Abdomen: Soft nontender nondistended with normal bowel sounds x4 quadrants. . Musculoskeletal: No lower extremity edema. 2/4 pulses in all 4 extremities. . Skin: No ulcerative  lesions noted or rashes, . Psychiatry: Mood is appropriate for condition and setting    Data Reviewed: CBC: Recent Labs  Lab 01/27/20 1355 01/28/20 1309  WBC 9.0 10.9*  NEUTROABS 7.2  --   HGB 12.9 10.4*  HCT 38.8 32.2*  MCV 89.4 91.0  PLT 141* 643*   Basic Metabolic Panel: Recent Labs  Lab 01/27/20 1357 01/28/20 1309  NA 136  --   K 3.1*  --   CL 100  --   CO2 25  --   GLUCOSE 106*  --   BUN 19  --   CREATININE 0.83 0.80  CALCIUM 8.8*  --    GFR: Estimated Creatinine Clearance: 39.9 mL/min (by C-G formula based on SCr of 0.8 mg/dL). Liver Function Tests: Recent Labs  Lab 01/27/20 1357  AST 19  ALT 14  ALKPHOS 55  BILITOT 1.0  PROT 6.3*  ALBUMIN 3.7   No results for input(s): LIPASE, AMYLASE in the last 168 hours. No results for input(s): AMMONIA in the last 168 hours. Coagulation Profile: Recent Labs  Lab 01/27/20 1355  INR 1.0   Cardiac Enzymes: No results for input(s): CKTOTAL, CKMB, CKMBINDEX, TROPONINI in the last 168 hours. BNP (last 3 results) No results for input(s): PROBNP in the last 8760 hours. HbA1C: No results for input(s): HGBA1C in the last 72 hours. CBG: No results for input(s): GLUCAP in the last 168 hours. Lipid Profile: No results for input(s): CHOL, HDL, LDLCALC, TRIG, CHOLHDL, LDLDIRECT in the last 72 hours. Thyroid Function Tests: No results for input(s): TSH, T4TOTAL, FREET4, T3FREE, THYROIDAB in the last 72 hours. Anemia Panel: No results for input(s): VITAMINB12, FOLATE, FERRITIN, TIBC, IRON, RETICCTPCT in the last 72 hours. Urine analysis:    Component Value Date/Time   COLORURINE YELLOW 12/29/2016 1227   APPEARANCEUR CLOUDY (A) 12/29/2016 1227   LABSPEC 1.016 12/29/2016 1227   PHURINE 6.0 12/29/2016 1227   GLUCOSEU NEGATIVE 12/29/2016 1227   HGBUR LARGE (A) 12/29/2016 1227   BILIRUBINUR neg 05/25/2019 1500   KETONESUR NEGATIVE 12/29/2016 1227   PROTEINUR Negative 05/25/2019 1500   PROTEINUR 100 (A) 12/29/2016  1227   UROBILINOGEN negative (A) 05/25/2019 1500   UROBILINOGEN 0.2 01/08/2014 1506   NITRITE neg 05/25/2019 1500   NITRITE NEGATIVE 12/29/2016 1227   LEUKOCYTESUR Negative 05/25/2019 1500   Sepsis Labs: @LABRCNTIP (procalcitonin:4,lacticidven:4)  ) Recent Results (from the past 240 hour(s))  SARS Coronavirus 2 by RT PCR (hospital order, performed in [place] hospital lab) Nasopharyngeal Nasopharyngeal Swab     Status: None   Collection Time: 01/27/20  1:37 PM   Specimen: Nasopharyngeal Swab  Result Value Ref Range Status   SARS Coronavirus 2 NEGATIVE NEGATIVE Final    Comment: (NOTE) SARS-CoV-2 target nucleic acids are NOT DETECTED.  The SARS-CoV-2 RNA is generally detectable in upper and lower respiratory specimens during the acute phase of infection. The lowest concentration of SARS-CoV-2 viral copies this assay can detect is 250 copies / mL. A negative result does not preclude SARS-CoV-2 infection and should not be used as the sole basis for treatment or other patient management decisions.  A negative result may occur with improper specimen collection / handling, submission of specimen other than nasopharyngeal swab, presence of viral  mutation(s) within the areas targeted by this assay, and inadequate number of viral copies (<250 copies / mL). A negative result must be combined with clinical observations, patient history, and epidemiological information.  Fact Sheet for Patients:   StrictlyIdeas.no  Fact Sheet for Healthcare Providers: BankingDealers.co.za  This test is not yet approved or  cleared by the [place] FDA and has been authorized for detection and/or diagnosis of SARS-CoV-2 by FDA under an Emergency Use Authorization (EUA).  This EUA will remain in effect (meaning this test can be used) for the duration of the COVID-19 declaration under Section 564(b)(1) of the Act, 21 U.S.C. section 360bbb-3(b)(1), unless  the authorization is terminated or revoked sooner.  Performed at [facility], [address], [place], [place] [address]   Surgical PCR screen     Status: None   Collection Time: 01/27/20 11:36 PM   Specimen: Nasal Mucosa; Nasal Swab  Result Value Ref Range Status   MRSA, PCR NEGATIVE NEGATIVE Final   Staphylococcus aureus NEGATIVE NEGATIVE Final    Comment: (NOTE) The Xpert SA Assay (FDA approved for NASAL specimens in patients 1 years of age and older), is one component of a comprehensive surveillance program. It is not intended to diagnose infection nor to guide or monitor treatment. Performed at [facility], [place] [address] [place], [place] [address]       Studies: DG Arthro Hip Right  Result Date: 01/28/2020 CLINICAL DATA:  Right hip arthroplasty EXAM: DG C-ARM 1-60 MIN; ARTHROGRAM OF THE RIGHT HIP FLUOROSCOPY TIME:  Fluoroscopy Time:  10 seconds Radiation Exposure Index (if provided by the fluoroscopic device): 1.15 mGy COMPARISON:  Right hip radiographs dated 01/27/2020 FINDINGS: Intraoperative fluoroscopic images during right hip arthroplasty. Surgical hardware is in satisfactory position. Prior left hip arthroplasty. IMPRESSION: Intraoperative fluoroscopic images during right hip arthroplasty. Electronically Signed   By: Julian Hy M.D.   On: 01/28/2020 10:29   Pelvis Portable  Result Date: 01/28/2020 CLINICAL DATA:  Postop displaced right femoral neck fracture EXAM: PORTABLE PELVIS 1-2 VIEWS COMPARISON:  01/27/2020 FINDINGS: Right hip arthroplasty, in satisfactory position. Overlying skin staples. Prior left hip arthroplasty. Visualized bony pelvis appears intact. Suspected uterine fibroid overlying the left pelvis. IMPRESSION: Right hip arthroplasty, in satisfactory position. Electronically Signed   By: Julian Hy M.D.   On: 01/28/2020 11:39   DG C-Arm 1-60 Min  Result Date: 01/28/2020 CLINICAL DATA:  Right hip arthroplasty EXAM: DG C-ARM 1-60 MIN;  ARTHROGRAM OF THE RIGHT HIP FLUOROSCOPY TIME:  Fluoroscopy Time:  10 seconds Radiation Exposure Index (if provided by the fluoroscopic device): 1.15 mGy COMPARISON:  Right hip radiographs dated 01/27/2020 FINDINGS: Intraoperative fluoroscopic images during right hip arthroplasty. Surgical hardware is in satisfactory position. Prior left hip arthroplasty. IMPRESSION: Intraoperative fluoroscopic images during right hip arthroplasty. Electronically Signed   By: Julian Hy M.D.   On: 01/28/2020 10:29    Scheduled Meds: . aspirin EC  81 mg Oral Daily  . carvedilol  12.5 mg Oral BID WC  . docusate sodium  100 mg Oral BID  . donepezil  10 mg Oral Daily  . [START ON 01/29/2020] enoxaparin (LOVENOX) injection  40 mg Subcutaneous Q24H  . escitalopram  5 mg Oral Daily  . feeding supplement  237 mL Oral BID BM  . memantine  10 mg Oral BID  . multivitamin  1 tablet Oral Daily  . mupirocin ointment  1 application Nasal BID  . simvastatin  20 mg Oral Q1200  . thyroid  60 mg Oral Daily    Continuous Infusions: .  ceFAZolin (ANCEF) IV 2 g (01/28/20 1529)  . methocarbamol (ROBAXIN) IV       LOS: 1 day     Cristal Deer, MD [facility]  To reach me or the doctor on call, go to: [email or web address] Password [facility]  01/28/2020, 5:34 PM

## 2020-01-28 NOTE — Anesthesia Postprocedure Evaluation (Signed)
Anesthesia Post Note  Patient: Jackie Phelps  Procedure(s) Performed: TOTAL HIP ARTHROPLASTY ANTERIOR APPROACH (Right Hip)     Patient location during evaluation: PACU Anesthesia Type: General Level of consciousness: awake and alert Pain management: pain level controlled Vital Signs Assessment: post-procedure vital signs reviewed and stable Respiratory status: spontaneous breathing, nonlabored ventilation and respiratory function stable Cardiovascular status: blood pressure returned to baseline and stable Postop Assessment: no apparent nausea or vomiting Anesthetic complications: no   No complications documented.  Last Vitals:  Vitals:   01/28/20 1104 01/28/20 1158  BP: (!) 172/67 (!) 151/64  Pulse: 64 60  Resp: 17 17  Temp: (!) 36.3 C 36.5 C  SpO2: 99% 100%    Last Pain:  Vitals:   01/28/20 1158  TempSrc: Oral  PainSc:                  Audry Pili

## 2020-01-28 NOTE — Progress Notes (Signed)
Spoke with pt about order for CPAP she is refusing at this time and states she does not wear at home.

## 2020-01-28 NOTE — Evaluation (Signed)
Physical Therapy Evaluation Patient Details Name: Jackie Phelps MRN: [id number] DOB: Apr 20, 1931 Today's Date: 01/28/2020   History of Present Illness  87 y.o. female with medical history significant of impaired memory/dementia (mild), anxiety/depression, hypertension, hyperlipidemia, sleep apnea vitamin D deficiency; presented to the emergency department complaining of right hip pain.  Underwent Right total hip arthroplasty, anterior approach 1/22.  Clinical Impression  PTA, patient lives with husband and is independent. Daughter is staying with parents full time and will be there to provide 24/7 supervision/assistance. Husband recently returned home from SNF after fall and fx of his own. Patient is overall minA currently with mobility with RW. Patient has baseline dementia with decreased awareness of deficits and memory deficits. Patient presents with generalized weakness, impaired balance, and decreased activity tolerance. Patient will benefit from skilled PT services during acute stay to address listed deficits. Recommend HHPT following discharge at this time, however may need SNF if daughter cannot arrange assist at home when assisting her father with CA treatments that he will be starting in a few weeks.     Follow Up Recommendations Home health PT;Supervision/Assistance - 24 hour    Equipment Recommendations  Rolling Charlita Brian with 5" wheels;3in1 (PT)    Recommendations for Other Services       Precautions / Restrictions Precautions Precautions: Anterior Hip;Fall Precaution Comments: baseline dementia Restrictions Weight Bearing Restrictions: Yes RLE Weight Bearing: Weight bearing as tolerated      Mobility  Bed Mobility Overal bed mobility: Needs Assistance Bed Mobility: Supine to Sit     Supine to sit: Min assist     General bed mobility comments: assist for bringing R LE to EOB    Transfers Overall transfer level: Needs assistance Equipment used: Rolling Atom Solivan (2  wheeled) Transfers: Sit to/from [facility] Sit to Stand: Min assist Stand pivot transfers: Min assist       General transfer comment: minA for boost up and RW management for stand pivot  Ambulation/Gait             General Gait Details: limited by oxygen line and fluid bag not on IV pole  Stairs            Wheelchair Mobility    Modified Rankin (Stroke Patients Only)       Balance Overall balance assessment: Needs assistance Sitting-balance support: No upper extremity supported;Feet supported Sitting balance-Leahy Scale: Good     Standing balance support: Bilateral upper extremity supported Standing balance-Leahy Scale: Poor Standing balance comment: reliant on UE support                             Pertinent Vitals/Pain Pain Assessment: Faces Faces Pain Scale: Hurts little more Pain Location: R hip Pain Descriptors / Indicators: Tender Pain Intervention(s): Monitored during session    Home Living Family/patient expects to be discharged to:: Private residence Living Arrangements: Spouse/significant other;Children Available Help at Discharge: Family;Available 24 hours/day Type of Home: House Home Access: Stairs to enter   [employer] of Steps: 2 Home Layout: Multi-level;Bed/bath upstairs Home Equipment: Tub bench Additional Comments: All DME is spouses, patient does not have any DME    Prior Function Level of Independence: Independent               Hand Dominance   Dominant Hand: Right    Extremity/Trunk Assessment   Upper Extremity Assessment Upper Extremity Assessment: Defer to OT evaluation    Lower Extremity Assessment Lower Extremity  Assessment: Generalized weakness    Cervical / Trunk Assessment Cervical / Trunk Assessment: Normal  Communication   Communication: No difficulties  Cognition Arousal/Alertness: Awake/alert Behavior During Therapy: Restless Overall Cognitive Status:  History of cognitive impairments - at baseline                                 General Comments: forgetfulness and decreased awareness of deficits.  cues for safety.      General Comments General comments (skin integrity, edema, etc.): On arrival, patient on 2L O2 [place] with spO2 >96%. Removed O2 with spO2 drop to 86%, instructed patient on pursed lip breathing, however would not go above 88%. Placed patient back on O2 with sats maintaining >95% throughout mobility.    Exercises     Assessment/Plan    PT Assessment Patient needs continued PT services  PT Problem List Decreased strength;Decreased range of motion;Decreased activity tolerance;Decreased balance;Decreased mobility;Decreased cognition;Decreased knowledge of use of DME;Decreased safety awareness       PT Treatment Interventions DME instruction;Stair training;Gait training;Functional mobility training;Therapeutic activities;Therapeutic exercise;Balance training;Patient/family education    PT Goals (Current goals can be found in the Care Plan section)  Acute Rehab PT Goals Patient Stated Goal: return home PT Goal Formulation: With patient/family Time For Goal Achievement: 02/11/20 Potential to Achieve Goals: Good    Frequency 7X/week   Barriers to discharge        Co-evaluation PT/OT/SLP Co-Evaluation/Treatment: Yes Reason for Co-Treatment: For patient/therapist safety PT goals addressed during session: Mobility/safety with mobility;Proper use of DME OT goals addressed during session: ADL's and self-care       AM-PAC PT "6 Clicks" Mobility  Outcome Measure Help needed turning from your back to your side while in a flat bed without using bedrails?: A Little Help needed moving from lying on your back to sitting on the side of a flat bed without using bedrails?: A Little Help needed moving to and from a bed to a chair (including a wheelchair)?: A Little Help needed standing up from a chair using your  arms (e.g., wheelchair or bedside chair)?: A Little Help needed to walk in hospital room?: A Little Help needed climbing 3-5 steps with a railing? : A Little 6 Click Score: 18    End of Session Equipment Utilized During Treatment: Gait belt;Oxygen Activity Tolerance: Patient tolerated treatment well Patient left: in chair;with call bell/phone within reach;with family/visitor present Nurse Communication: Mobility status PT Visit Diagnosis: Unsteadiness on feet (R26.81);Muscle weakness (generalized) (M62.81);History of falling (Z91.81);Other abnormalities of gait and mobility (R26.89)    Time: 0174-9449 PT Time Calculation (min) (ACUTE ONLY): 25 min   Charges:   PT Evaluation $PT Eval Low Complexity: 1 Low          Magdelyn Roebuck A. Gilford Rile PT, DPT Acute Rehabilitation Services Pager [phone] Office [phone]   Alda Lea 01/28/2020, 3:02 PM

## 2020-01-28 NOTE — Discharge Instructions (Signed)
°Dr. Henderson Frampton °Joint Replacement Specialist °[facility] °[address] °[place], [place] [address] °[phone] ° ° °TOTAL HIP REPLACEMENT POSTOPERATIVE DIRECTIONS ° ° ° °Hip Rehabilitation, Guidelines Following Surgery  ° °WEIGHT BEARING °Weight bearing as tolerated with assist device (walker, cane, etc) as directed, use it as long as suggested by your surgeon or therapist, typically at least 4-6 weeks. ° °The results of a hip operation are greatly improved after range of motion and muscle strengthening exercises. Follow all safety measures which are given to protect your hip. If any of these exercises cause increased pain or swelling in your joint, decrease the amount until you are comfortable again. Then slowly increase the exercises. Call your caregiver if you have problems or questions.  ° °HOME CARE INSTRUCTIONS  °Most of the following instructions are designed to prevent the dislocation of your new hip.  °Remove items at home which could result in a fall. This includes throw rugs or furniture in walking pathways.  °Continue medications as instructed at time of discharge. °· You may have some home medications which will be placed on hold until you complete the course of blood thinner medication. °· You may start showering once you are discharged home. Do not remove your dressing. °Do not put on socks or shoes without following the instructions of your caregivers.   °Sit on chairs with arms. Use the chair arms to help push yourself up when arising.  °Arrange for the use of a toilet seat elevator so you are not sitting low.  °· Walk with walker as instructed.  °You may resume a sexual relationship in one month or when given the OK by your caregiver.  °Use walker as long as suggested by your caregivers.  °You may put full weight on your legs and walk as much as is comfortable. °Avoid periods of inactivity such as sitting longer than an hour when not asleep. This helps prevent  blood clots.  °You may return to work once you are cleared by your surgeon.  °Do not drive a car for 6 weeks or until released by your surgeon.  °Do not drive while taking narcotics.  °Wear elastic stockings for two weeks following surgery during the day but you may remove then at night.  °Make sure you keep all of your appointments after your operation with all of your doctors and caregivers. You should call the office at the above phone number and make an appointment for approximately two weeks after the date of your surgery. °Please pick up a stool softener and laxative for home use as long as you are requiring pain medications. °· ICE to the affected hip every three hours for 30 minutes at a time and then as needed for pain and swelling. Continue to use ice on the hip for pain and swelling from surgery. You may notice swelling that will progress down to the foot and ankle.  This is normal after surgery.  Elevate the leg when you are not up walking on it.   °It is important for you to complete the blood thinner medication as prescribed by your doctor. °· Continue to use the breathing machine which will help keep your temperature down.  It is common for your temperature to cycle up and down following surgery, especially at night when you are not up moving around and exerting yourself.  The breathing machine keeps your lungs expanded and your temperature down. ° °RANGE OF MOTION AND STRENGTHENING EXERCISES  °These exercises are   designed to help you keep full movement of your hip joint. Follow your caregiver's or physical therapist's instructions. Perform all exercises about fifteen times, three times per day or as directed. Exercise both hips, even if you have had only one joint replacement. These exercises can be done on a training (exercise) mat, on the floor, on a table or on a bed. Use whatever works the best and is most comfortable for you. Use music or television while you are exercising so that the exercises  are a pleasant break in your day. This will make your life better with the exercises acting as a break in routine you can look forward to.  °Lying on your back, slowly slide your foot toward your buttocks, raising your knee up off the floor. Then slowly slide your foot back down until your leg is straight again.  °Lying on your back spread your legs as far apart as you can without causing discomfort.  °Lying on your side, raise your upper leg and foot straight up from the floor as far as is comfortable. Slowly lower the leg and repeat.  °Lying on your back, tighten up the muscle in the front of your thigh (quadriceps muscles). You can do this by keeping your leg straight and trying to raise your heel off the floor. This helps strengthen the largest muscle supporting your knee.  °Lying on your back, tighten up the muscles of your buttocks both with the legs straight and with the knee bent at a comfortable angle while keeping your heel on the floor.  ° °SKILLED REHAB INSTRUCTIONS: °If the patient is transferred to a skilled rehab facility following release from the hospital, a list of the current medications will be sent to the facility for the patient to continue.  When discharged from the skilled rehab facility, please have the facility set up the patient's Home Health Physical Therapy prior to being released. Also, the skilled facility will be responsible for providing the patient with their medications at time of release from the facility to include their pain medication and their blood thinner medication. If the patient is still at the rehab facility at time of the two week follow up appointment, the skilled rehab facility will also need to assist the patient in arranging follow up appointment in our office and any transportation needs. ° °MAKE SURE YOU:  °Understand these instructions.  °Will watch your condition.  °Will get help right away if you are not doing well or get worse. ° °Pick up stool softner and  laxative for home use following surgery while on pain medications. °Do not remove your dressing. °The dressing is waterproof--it is OK to take showers. °Continue to use ice for pain and swelling after surgery. °Do not use any lotions or creams on the incision until instructed by your surgeon. °Total Hip Protocol. ° ° °

## 2020-01-29 ENCOUNTER — Encounter (INDEPENDENT_AMBULATORY_CARE_PROVIDER_SITE_OTHER): Payer: Self-pay | Admitting: Internal Medicine

## 2020-01-29 LAB — CBC
HCT: 26.4 % — ABNORMAL LOW (ref 36.0–46.0)
Hemoglobin: 9 g/dL — ABNORMAL LOW (ref 12.0–15.0)
MCH: 30.1 pg (ref 26.0–34.0)
MCHC: 34.1 g/dL (ref 30.0–36.0)
MCV: 88.3 fL (ref 80.0–100.0)
Platelets: 92 10*3/uL — ABNORMAL LOW (ref 150–400)
RBC: 2.99 MIL/uL — ABNORMAL LOW (ref 3.87–5.11)
RDW: 12.1 % (ref 11.5–15.5)
WBC: 7 10*3/uL (ref 4.0–10.5)
nRBC: 0 % (ref 0.0–0.2)

## 2020-01-29 LAB — BASIC METABOLIC PANEL
Anion gap: 9 (ref 5–15)
BUN: 22 mg/dL (ref 8–23)
CO2: 24 mmol/L (ref 22–32)
Calcium: 8 mg/dL — ABNORMAL LOW (ref 8.9–10.3)
Chloride: 102 mmol/L (ref 98–111)
Creatinine, Ser: 0.99 mg/dL (ref 0.44–1.00)
GFR, Estimated: 56 mL/min — ABNORMAL LOW (ref >60)
Glucose, Bld: 109 mg/dL — ABNORMAL HIGH (ref 70–99)
Potassium: 3.8 mmol/L (ref 3.5–5.1)
Sodium: 135 mmol/L (ref 135–145)

## 2020-01-29 NOTE — Plan of Care (Signed)

## 2020-01-29 NOTE — Progress Notes (Signed)
   Subjective: 1 Day Post-Op Procedure(s) (LRB): TOTAL HIP ARTHROPLASTY ANTERIOR APPROACH (Right) Patient reports pain as mild.   Patient seen in rounds for Dr. Lyla Glassing. Patient is well, and has had no acute complaints or problems other than soreness in the right hip. Denies chest pain or SOB. No issues overnight. Voiding without difficulty. We will continue therapy today.   Objective: Vital signs in last 24 hours: Temp:  [97.3 F (36.3 C)-99.1 F (37.3 C)] 98.2 F (36.8 C) (01/23 0816) Pulse Rate:  [60-80] 80 (01/23 0816) Resp:  [14-18] 16 (01/23 0816) BP: (141-172)/(54-77) 150/64 (01/23 0816) SpO2:  [89 %-100 %] 98 % (01/23 0816)  Intake/Output from previous day:  Intake/Output Summary (Last 24 hours) at 01/29/2020 0908 Last data filed at 01/28/2020 1600 Gross per 24 hour  Intake 950 ml  Output 250 ml  Net 700 ml     Intake/Output this shift: No intake/output data recorded.  Labs: Recent Labs    01/27/20 1355 01/28/20 1309 01/29/20 0221  HGB 12.9 10.4* 9.0*   Recent Labs    01/28/20 1309 01/29/20 0221  WBC 10.9* 7.0  RBC 3.54* 2.99*  HCT 32.2* 26.4*  PLT 101* 92*   Recent Labs    01/27/20 1357 01/28/20 1309 01/29/20 0221  NA 136  --  135  K 3.1*  --  3.8  CL 100  --  102  CO2 25  --  24  BUN 19  --  22  CREATININE 0.83 0.80 0.99  GLUCOSE 106*  --  109*  CALCIUM 8.8*  --  8.0*   Recent Labs    01/27/20 1355  INR 1.0    Exam: General - Patient is Alert and Oriented Extremity - Neurologically intact Neurovascular intact Sensation intact distally Dorsiflexion/Plantar flexion intact Dressing - dressing C/D/I Motor Function - intact, moving foot and toes well on exam.   Past Medical History:  Diagnosis Date  . Anxiety   . Complication of anesthesia    Has increased BP after surgery.  . Constipation   . HLD (hyperlipidemia) 10/26/2018  . Hypercholesteremia   . Hypertension   . Impaired memory    due to severe sleep apnea,on CPAP  now;stopped breathing 53.9 times per hour previously on study.  . Sleep apnea   . Viral warts 10/26/2018  . Vitamin D deficiency disease 10/26/2018    Assessment/Plan: 1 Day Post-Op Procedure(s) (LRB): TOTAL HIP ARTHROPLASTY ANTERIOR APPROACH (Right) Active Problems:   Other fracture of right femur, initial encounter for closed fracture (HCC)  Estimated body mass index is 22.31 kg/m as calculated from the following:   Height as of this encounter: 5\' 2"  (1.575 m).   Weight as of this encounter: 55.3 kg. Advance diet Up with therapy  DVT Prophylaxis - Aspirin and Lovenox Weight bearing as tolerated. Continue therapy.  Theresa Duty, PA-C Orthopedic Surgery [phone] 01/29/2020, 9:08 AM

## 2020-01-29 NOTE — Progress Notes (Signed)
Physical Therapy Treatment Patient Details Name: Jackie Phelps MRN: [id number] DOB: [birth year] Today's Date: 01/29/2020    History of Present Illness 87 y.o. female with medical history significant of impaired memory/dementia (mild), anxiety/depression, hypertension, hyperlipidemia, sleep apnea vitamin D deficiency; presented to the emergency department complaining of right hip pain.  Underwent Right total hip arthroplasty, anterior approach 1/22.    PT Comments    Pt making progress, ambulating within the room short bouts with a RW and min guard-minA this date. As distance progresses her pain and fatigue increase, resulting in hip and knee flexion and tendency to rest leaning forearms onto RW, placing her at risk for falls. Pt educated on hip precautions and to turn to L, not take steps backwards with R leg, not cross legs, and not roll onto R leg. Pt required modA to come to sit EOB secondary to her pain in her hip. Will continue to follow acutely. Current recommendations remain appropriate.   Follow Up Recommendations  Home health PT;Supervision/Assistance - 24 hour     Equipment Recommendations  Rolling walker with 5" wheels;3in1 (PT)    Recommendations for Other Services       Precautions / Restrictions Precautions Precautions: Anterior Hip;Fall Precaution Comments: baseline dementia Restrictions Weight Bearing Restrictions: Yes RLE Weight Bearing: Weight bearing as tolerated    Mobility  Bed Mobility Overal bed mobility: Needs Assistance Bed Mobility: Supine to Sit     Supine to sit: Mod assist     General bed mobility comments: Cued pt to slide legs to L EOB and push up on elbows > hands, assistance at R leg and trunk to complete. Bed flat, no use of rails.  Transfers Overall transfer level: Needs assistance Equipment used: Rolling walker (2 wheeled) Transfers: Sit to/from Stand Sit to Stand: Min guard         General transfer comment: Min guard for safety,  extra time to power up to stand. Pt tends to pull up on RW and needs cues for hand placement.  Ambulation/Gait Ambulation/Gait assistance: Min assist Gait Distance (Feet): 20 Feet (x2 bouts ~20 ft > ~10 ft) Assistive device: Rolling walker (2 wheeled) Gait Pattern/deviations: Step-to pattern;Decreased step length - right;Decreased stance time - right;Decreased stride length;Shuffle;Antalgic;Trunk flexed Gait velocity: reduced Gait velocity interpretation: <1.31 ft/sec, indicative of household ambulator General Gait Details: Ambulates with step-to antalgic gait pattern slowly. Increased trunk and knee flexion with pt attempting to lean forearms on RW as pt fatigued with distance. Min guard-minA depending on fatigue and pain for stability. Cues to turn to L to avoid external rotation of R and to avoid stepping backwards with R to avoid hip extension for safety, mod success.   Stairs             Wheelchair Mobility    Modified Rankin (Stroke Patients Only)       Balance Overall balance assessment: Needs assistance Sitting-balance support: No upper extremity supported;Feet supported Sitting balance-Leahy Scale: Good     Standing balance support: Bilateral upper extremity supported Standing balance-Leahy Scale: Poor Standing balance comment: reliant on UE support                            Cognition Arousal/Alertness: Awake/alert Behavior During Therapy: WFL for tasks assessed/performed Overall Cognitive Status: History of cognitive impairments - at baseline  General Comments: forgetfulness and decreased awareness of deficits.  cues for safety.      Exercises      General Comments General comments (skin integrity, edema, etc.): Weaned to RA from 2L/min O2 via [place] with pt maintaining SpO2 >/= 88% throughout, donned [place] at 1L/min end of session to keep SpO2 >/= 95%, notified RN      Pertinent Vitals/Pain Pain  Assessment: Faces Faces Pain Scale: Hurts even more Pain Location: R hip Pain Descriptors / Indicators: Tender;Discomfort;Grimacing;Guarding;Operative site guarding Pain Intervention(s): Limited activity within patient's tolerance;Monitored during session;Repositioned    Home Living                      Prior Function            PT Goals (current goals can now be found in the care plan section) Acute Rehab PT Goals Patient Stated Goal: return home PT Goal Formulation: With patient/family Time For Goal Achievement: 02/11/20 Potential to Achieve Goals: Good Progress towards PT goals: Progressing toward goals    Frequency    7X/week      PT Plan Current plan remains appropriate    Co-evaluation              AM-PAC PT "6 Clicks" Mobility   Outcome Measure  Help needed turning from your back to your side while in a flat bed without using bedrails?: A Little Help needed moving from lying on your back to sitting on the side of a flat bed without using bedrails?: A Lot Help needed moving to and from a bed to a chair (including a wheelchair)?: A Little Help needed standing up from a chair using your arms (e.g., wheelchair or bedside chair)?: A Little Help needed to walk in hospital room?: A Little Help needed climbing 3-5 steps with a railing? : A Little 6 Click Score: 17    End of Session Equipment Utilized During Treatment: Gait belt;Oxygen Activity Tolerance: Patient tolerated treatment well Patient left: in chair;with call bell/phone within reach;with family/visitor present;with chair alarm set Nurse Communication: Mobility status;Other (comment) (O2 sats) PT Visit Diagnosis: Unsteadiness on feet (R26.81);Muscle weakness (generalized) (M62.81);History of falling (Z91.81);Other abnormalities of gait and mobility (R26.89);Difficulty in walking, not elsewhere classified (R26.2);Pain Pain - Right/Left: Right Pain - part of body: Leg     Time: 3532-9924 PT  Time Calculation (min) (ACUTE ONLY): 40 min  Charges:  $Gait Training: 23-37 mins $Therapeutic Activity: 8-22 mins                     Moishe Spice, PT, DPT Acute Rehabilitation Services  Pager: [phone] Office: [place] 01/29/2020, 3:54 PM

## 2020-01-29 NOTE — Progress Notes (Addendum)
PROGRESS NOTE  MCKINNLEY COTTIER XBJ:[id number] DOB: 20-Oct-1931 DOA: 01/27/2020 PCP: Doree Albee, MD  HPI/Recap of past 24 hours: 87 y.o. female with medical history significant of impaired memory/dementia (mild), anxiety/depression, hypertension, hyperlipidemia, sleep apnea vitamin D deficiency; presented to the emergency department complaining of right hip pain and inability to bear weight x-ray was done and it showed displaced right femoral neck fracture . She underwent Right total hip arthroplasty, anterior approach 1/22.  Subjective: January 29, 2020 Patient seen and examined at bedside denies any other complaint except for mild pain on the right hip area postsurgery.  January 28, 2020: Patient seen and examined at bedside she has just returned from surgery. She fell and had a fracture of the right hip  Assessment/Plan: Active Problems:   Other fracture of right femur, initial encounter for closed fracture ([place]) #1 displaced femoral neck plan fracture. Status post right total hip arthroplasty. Orthopedic is following they recommend continued weightbearing as tolerated and physical therapy Physical therapy is recommending home health physical therapy, and 24-hour  supervision/assistance as well as rolling walker with 5 inch wheels 3 and 3 in 1  2. Hypertension uncontrolled we will resume home medication for blood pressure  3. History of her Alzheimer's will continue Aricept and Namenda  4. Depression and anxiety continue Lexapro  5. History of obstructive sleep apnea. Continue CPAP per home  6. Mild reactive leukocytosis we will continue to monitor  7. Mild hypokalemia it has been replaced will recheck level  Code Status: Full  Severity of Illness: The appropriate patient status for this patient is INPATIENT. Inpatient status is judged to be reasonable and necessary in order to provide the required intensity of service to ensure the patient's safety. The patient's  presenting symptoms, physical exam findings, and initial radiographic and laboratory data in the context of their chronic comorbidities is felt to place them at high risk for further clinical deterioration. Furthermore, it is not anticipated that the patient will be medically stable for discharge from the hospital within 2 midnights of admission. The following factors support the patient status of inpatient.   " The patient's presenting symptoms include displaced right hip fracture. " The worrisome physical exam findings include inability to bear weight. " The initial radiographic and laboratory data are worrisome because of displaced right hip fracture. " The chronic co-morbidities include mild dementia.   * I certify that at the point of admission it is my clinical judgment that the patient will require inpatient hospital care spanning beyond 2 midnights from the point of admission due to high intensity of service, high risk for further deterioration and high frequency of surveillance required.*    Family Communication: Self  Disposition Plan: For rehab   Consultants:  Orthopedic  Procedures:  Right total hip arthroplasty  Antimicrobials:  None  DVT prophylaxis: Heparin   Objective: Vitals:   01/29/20 0008 01/29/20 0346 01/29/20 0816 01/29/20 1500  BP: (!) 141/56 (!) 154/62 (!) 150/64 (!) 120/47  Pulse: 67 71 80 72  Resp: 16 16 16    Temp: 98.9 F (37.2 C) 99 F (37.2 C) 98.2 F (36.8 C) 97.6 F (36.4 C)  TempSrc: Oral Oral Oral Oral  SpO2: 99% 99% 98% 96%  Weight:      Height:       No intake or output data in the 24 hours ending 01/29/20 1715 Filed Weights   01/27/20 1051  Weight: 55.3 kg   Body mass index is 22.31 kg/m.  Exam:  .  General: 87 y.o. year-old female well developed well nourished in no acute distress.  Alert and oriented x3. Pleasant. Not in acute distress . Cardiovascular: Regular rate and rhythm with no rubs or gallops.  No thyromegaly or  JVD noted.   Marland Kitchen Respiratory: Clear to auscultation with no wheezes or rales. Good inspiratory effort. . Abdomen: Soft nontender nondistended with normal bowel sounds x4 quadrants. . Musculoskeletal: No lower extremity edema. 2/4 pulses in all 4 extremities. . Skin: No ulcerative lesions noted or rashes, . Psychiatry: Mood is appropriate for condition and setting    Data Reviewed: CBC: Recent Labs  Lab 01/27/20 1355 01/28/20 1309 01/29/20 0221  WBC 9.0 10.9* 7.0  NEUTROABS 7.2  --   --   HGB 12.9 10.4* 9.0*  HCT 38.8 32.2* 26.4*  MCV 89.4 91.0 88.3  PLT 141* 101* 92*   Basic Metabolic Panel: Recent Labs  Lab 01/27/20 1357 01/28/20 1309 01/29/20 0221  NA 136  --  135  K 3.1*  --  3.8  CL 100  --  102  CO2 25  --  24  GLUCOSE 106*  --  109*  BUN 19  --  22  CREATININE 0.83 0.80 0.99  CALCIUM 8.8*  --  8.0*   GFR: Estimated Creatinine Clearance: 32.3 mL/min (by C-G formula based on SCr of 0.99 mg/dL). Liver Function Tests: Recent Labs  Lab 01/27/20 1357  AST 19  ALT 14  ALKPHOS 55  BILITOT 1.0  PROT 6.3*  ALBUMIN 3.7   No results for input(s): LIPASE, AMYLASE in the last 168 hours. No results for input(s): AMMONIA in the last 168 hours. Coagulation Profile: Recent Labs  Lab 01/27/20 1355  INR 1.0   Cardiac Enzymes: No results for input(s): CKTOTAL, CKMB, CKMBINDEX, TROPONINI in the last 168 hours. BNP (last 3 results) No results for input(s): PROBNP in the last 8760 hours. HbA1C: No results for input(s): HGBA1C in the last 72 hours. CBG: No results for input(s): GLUCAP in the last 168 hours. Lipid Profile: No results for input(s): CHOL, HDL, LDLCALC, TRIG, CHOLHDL, LDLDIRECT in the last 72 hours. Thyroid Function Tests: No results for input(s): TSH, T4TOTAL, FREET4, T3FREE, THYROIDAB in the last 72 hours. Anemia Panel: No results for input(s): VITAMINB12, FOLATE, FERRITIN, TIBC, IRON, RETICCTPCT in the last 72 hours. Urine analysis:    Component  Value Date/Time   COLORURINE YELLOW 12/29/2016 1227   APPEARANCEUR CLOUDY (A) 12/29/2016 1227   LABSPEC 1.016 12/29/2016 1227   PHURINE 6.0 12/29/2016 1227   GLUCOSEU NEGATIVE 12/29/2016 1227   HGBUR LARGE (A) 12/29/2016 1227   BILIRUBINUR neg 05/25/2019 1500   KETONESUR NEGATIVE 12/29/2016 1227   PROTEINUR Negative 05/25/2019 1500   PROTEINUR 100 (A) 12/29/2016 1227   UROBILINOGEN negative (A) 05/25/2019 1500   UROBILINOGEN 0.2 01/08/2014 1506   NITRITE neg 05/25/2019 1500   NITRITE NEGATIVE 12/29/2016 1227   LEUKOCYTESUR Negative 05/25/2019 1500   Sepsis Labs: @LABRCNTIP (procalcitonin:4,lacticidven:4)  ) Recent Results (from the past 240 hour(s))  SARS Coronavirus 2 by RT PCR (hospital order, performed in [place] hospital lab) Nasopharyngeal Nasopharyngeal Swab     Status: None   Collection Time: 01/27/20  1:37 PM   Specimen: Nasopharyngeal Swab  Result Value Ref Range Status   SARS Coronavirus 2 NEGATIVE NEGATIVE Final    Comment: (NOTE) SARS-CoV-2 target nucleic acids are NOT DETECTED.  The SARS-CoV-2 RNA is generally detectable in upper and lower respiratory specimens during the acute phase of infection. The lowest concentration of  SARS-CoV-2 viral copies this assay can detect is 250 copies / mL. A negative result does not preclude SARS-CoV-2 infection and should not be used as the sole basis for treatment or other patient management decisions.  A negative result may occur with improper specimen collection / handling, submission of specimen other than nasopharyngeal swab, presence of viral mutation(s) within the areas targeted by this assay, and inadequate number of viral copies (<250 copies / mL). A negative result must be combined with clinical observations, patient history, and epidemiological information.  Fact Sheet for Patients:   StrictlyIdeas.no  Fact Sheet for Healthcare  Providers: BankingDealers.co.za  This test is not yet approved or  cleared by the [place] FDA and has been authorized for detection and/or diagnosis of SARS-CoV-2 by FDA under an Emergency Use Authorization (EUA).  This EUA will remain in effect (meaning this test can be used) for the duration of the COVID-19 declaration under Section 564(b)(1) of the Act, 21 U.S.C. section 360bbb-3(b)(1), unless the authorization is terminated or revoked sooner.  Performed at [facility], [address] [place], [place] [address]   Surgical PCR screen     Status: None   Collection Time: 01/27/20 11:36 PM   Specimen: Nasal Mucosa; Nasal Swab  Result Value Ref Range Status   MRSA, PCR NEGATIVE NEGATIVE Final   Staphylococcus aureus NEGATIVE NEGATIVE Final    Comment: (NOTE) The Xpert SA Assay (FDA approved for NASAL specimens in patients 27 years of age and older), is one component of a comprehensive surveillance program. It is not intended to diagnose infection nor to guide or monitor treatment. Performed at [facility], [place] [address] [place], [place] [address]       Studies: No results found.  Scheduled Meds: . aspirin EC  81 mg Oral Daily  . carvedilol  12.5 mg Oral BID WC  . docusate sodium  100 mg Oral BID  . donepezil  10 mg Oral Daily  . enoxaparin (LOVENOX) injection  40 mg Subcutaneous Q24H  . escitalopram  5 mg Oral Daily  . feeding supplement  237 mL Oral BID BM  . memantine  10 mg Oral BID  . multivitamin  1 tablet Oral Daily  . mupirocin ointment  1 application Nasal BID  . simvastatin  20 mg Oral Q1200  . thyroid  60 mg Oral Daily    Continuous Infusions: . methocarbamol (ROBAXIN) IV       LOS: 2 days     Cristal Deer, MD [facility]  To reach me or the doctor on call, go to: [email or web address] Password [facility]  01/29/2020, 5:15 PM

## 2020-01-29 NOTE — Plan of Care (Signed)

## 2020-01-30 ENCOUNTER — Encounter (HOSPITAL_COMMUNITY): Payer: Self-pay | Admitting: Orthopedic Surgery

## 2020-01-30 LAB — BASIC METABOLIC PANEL
Anion gap: 10 (ref 5–15)
BUN: 17 mg/dL (ref 8–23)
CO2: 22 mmol/L (ref 22–32)
Calcium: 8.4 mg/dL — ABNORMAL LOW (ref 8.9–10.3)
Chloride: 102 mmol/L (ref 98–111)
Creatinine, Ser: 0.78 mg/dL (ref 0.44–1.00)
GFR, Estimated: >60 mL/min (ref >60)
Glucose, Bld: 100 mg/dL — ABNORMAL HIGH (ref 70–99)
Potassium: 3.8 mmol/L (ref 3.5–5.1)
Sodium: 134 mmol/L — ABNORMAL LOW (ref 135–145)

## 2020-01-30 LAB — CBC
HCT: 27.5 % — ABNORMAL LOW (ref 36.0–46.0)
Hemoglobin: 9.2 g/dL — ABNORMAL LOW (ref 12.0–15.0)
MCH: 29.4 pg (ref 26.0–34.0)
MCHC: 33.5 g/dL (ref 30.0–36.0)
MCV: 87.9 fL (ref 80.0–100.0)
Platelets: 106 10*3/uL — ABNORMAL LOW (ref 150–400)
RBC: 3.13 MIL/uL — ABNORMAL LOW (ref 3.87–5.11)
RDW: 12.1 % (ref 11.5–15.5)
WBC: 6.2 10*3/uL (ref 4.0–10.5)
nRBC: 0 % (ref 0.0–0.2)

## 2020-01-30 MED ORDER — DOCUSATE SODIUM 100 MG PO CAPS: 100.0000 mg | ORAL_CAPSULE | Freq: Two times a day (BID) | ORAL | 0 refills | Status: DC

## 2020-01-30 MED ORDER — HYDROCODONE-ACETAMINOPHEN 5-325 MG PO TABS: 1.0000 | ORAL_TABLET | Freq: Four times a day (QID) | ORAL | 0 refills | Status: DC | PRN

## 2020-01-30 MED ORDER — MUPIROCIN 2 % EX OINT: 1.0000 "application " | TOPICAL_OINTMENT | Freq: Two times a day (BID) | CUTANEOUS | 0 refills | Status: DC

## 2020-01-30 MED ORDER — ENSURE ENLIVE PO LIQD: 237.0000 mL | Freq: Two times a day (BID) | ORAL | 12 refills | Status: DC

## 2020-01-30 MED ORDER — POLYETHYLENE GLYCOL 3350 17 G PO PACK: 17.0000 g | PACK | Freq: Every day | ORAL | 0 refills | Status: DC | PRN

## 2020-01-30 MED ORDER — METHOCARBAMOL 500 MG PO TABS
500.0000 mg | ORAL_TABLET | Freq: Two times a day (BID) | ORAL | 0 refills | Status: AC | PRN
Start: 2020-01-30 — End: 2020-02-14

## 2020-01-30 MED ORDER — ASPIRIN 81 MG PO CHEW: 81.0000 mg | CHEWABLE_TABLET | Freq: Two times a day (BID) | ORAL | 0 refills | Status: AC

## 2020-01-30 MED ORDER — PROSIGHT PO TABS: 1.0000 | ORAL_TABLET | Freq: Every day | ORAL | 0 refills | Status: DC

## 2020-01-30 NOTE — Discharge Summary (Signed)
Discharge Summary  Jackie Phelps OIZ:[id number] DOB: 04-Dec-1931  PCP: Doree Albee, MD  Admit date: 01/27/2020 Discharge date: 01/30/2020  Time spent: 40  Recommendations for Outpatient Follow-up:   #1 primary care provider in 2 to 3 weeks  2.Follow up with Dr.Swinteck's clinic in 2 weeks for staple removal and radiographs     Follow Up Recommendations  Home health PT;Supervision/Assistance - 24 hour     Equipment Recommendations  Rolling walker with 5" wheels;3in1 (PT)    Recommendations for Other Services       Discharge Diagnoses:  Active Hospital Problems   Diagnosis Date Noted  . Other fracture of right femur, initial encounter for closed fracture ([place]) 01/27/2020  . Anxiety state 01/16/2014  . Chronic constipation 02/03/2011  . Hypertension 02/03/2011    Resolved Hospital Problems  No resolved problems to display.    Discharge Condition: Improved  Diet recommendation: Regular heart healthy diet  Vitals:   01/30/20 0819 01/30/20 1500  BP: (!) 154/74 (!) 154/60  Pulse: 75 75  Resp: 16 15  Temp: 99.8 F (37.7 C) 99.6 F (37.6 C)  SpO2: 93% 95%    History of present illness:   87 y.o. female with medical history significant of impaired memory/dementia (mild), anxiety/depression, hypertension, hyperlipidemia, sleep apnea vitamin D deficiency; presented to the emergency department complaining of right hip pain and inability to bear weight x-ray was done and it showed displaced right femoral neck fracture. She underwent Right total hip arthroplasty, anterior approach 1/22.  Hospital Course:  Principal Problem:   Other fracture of right femur, initial encounter for closed fracture [facility]) Active Problems:   Chronic constipation   Hypertension   Anxiety state  Other fracture of right femur, initial encounter for closed fracture ([place]) #1 displaced femoral neck plan fracture. Status post right total hip arthroplasty on January 28, 2020. Orthopedic is following they recommend continued weightbearing as tolerated and physical therapy Physical therapy is recommending home health physical therapy, and 24-hour  supervision/assistance as well as rolling walker with 5 inch wheels 3 and 3 in 1  2. Hypertension was uncontrolled.  Continue all home medication for blood pressure  3. History of her Alzheimer's continue Aricept and Namenda  4. Depression and anxiety continue Lexapro  5. History of obstructive sleep apnea. Continue CPAP per home  6. Mild reactive leukocytosis we will continue to monitor  7. Mild hypokalemia it has been replaced will recheck level  8.  Hypothyroidism continue home dose of Synthroid  Procedures:   TOTAL HIP ARTHROPLASTY ANTERIOR APPROACH (Right)  Consultations:  Orthopedic  Discharge Exam: BP (!) 154/60 (BP Location: Left Arm)   Pulse 75   Temp 99.6 F (37.6 C) (Oral)   Resp 15   Ht 5\' 2"  (1.575 m)   Wt 55.3 kg   SpO2 95%   BMI 22.31 kg/m   General: Alert oriented x3 pleasant Cardiovascular: Regular rate and rhythm no murmur Respiratory: No distress clear to auscultation Musculoskeletal: Tenderness right lower extremity Skin: Dressing in the right femoral area stitches intact  Discharge Instructions You were cared for by a hospitalist during your hospital stay. If you have any questions about your discharge medications or the care you received while you were in the hospital after you are discharged, you can call the unit and asked to speak with the hospitalist on call if the hospitalist that took care of you is not available. Once you are discharged, your primary care physician will handle any further medical  issues. Please note that NO REFILLS for any discharge medications will be authorized once you are discharged, as it is imperative that you return to your primary care physician (or establish a relationship with a primary care physician if you do not have one) for your  aftercare needs so that they can reassess your need for medications and monitor your lab values.  Discharge Instructions    Call MD for:   Complete by: As directed    Call MD for:  severe uncontrolled pain   Complete by: As directed    Call MD for:  temperature >100.4   Complete by: As directed    Diet - low sodium heart healthy   Complete by: As directed    Diet general   Complete by: As directed    Heart helthy   Discharge instructions   Complete by: As directed    WBAT with walker DVT ppx: TEDS   ASA 81mg  BID at D/C PO pain control PT/OT Dispo: D/C home ,Follow up with Dr.Swinteck's clinic in 2 weeks for staple removal and radiographs   For home use only DME 4 wheeled rolling walker with seat   Complete by: As directed    Patient needs a walker to treat with the following condition: Fracture closed, femur, shaft (HCC)   Increase activity slowly   Complete by: As directed    Fall precaution   WBAT with walker   No dressing needed   Complete by: As directed      Allergies as of 01/30/2020      Reactions   Penicillins Hives, Rash   Has patient had a PCN reaction causing immediate rash, facial/tongue/throat swelling, SOB or lightheadedness with hypotension: unknown Has patient had a PCN reaction causing severe rash involving mucus membranes or skin necrosis: unknown Has patient had a PCN reaction that required hospitalization: unknown Has patient had a PCN reaction occurring within the last 10 years: unknown Patient tolerated Ancef without any complications.   Hydrochlorothiazide Other (See Comments)   Dehydration      Medication List    STOP taking these medications   multivitamin-lutein Caps capsule Replaced by: multivitamin Tabs tablet   SB Low Dose ASA EC 81 MG EC tablet Generic drug: aspirin Replaced by: aspirin 81 MG chewable tablet     TAKE these medications   aspirin 81 MG chewable tablet Commonly known as: Aspirin Childrens Chew 1 tablet (81 mg total)  by mouth 2 (two) times daily with a meal. Replaces: SB Low Dose ASA EC 81 MG EC tablet   carvedilol 6.25 MG tablet Commonly known as: COREG TAKE TWO TABLETS BY MOUTH TWICE DAILY What changed: when to take this   docusate sodium 100 MG capsule Commonly known as: COLACE Take 1 capsule (100 mg total) by mouth 2 (two) times daily.   donepezil 10 MG tablet Commonly known as: ARICEPT Take 10 mg by mouth daily.   escitalopram 5 MG tablet Commonly known as: LEXAPRO TAKE 1 TABLET BY MOUTH EVERY EVENING What changed: when to take this   feeding supplement Liqd Take 237 mLs by mouth 2 (two) times daily between meals. Start taking on: January 31, 2020   hydrochlorothiazide 12.5 MG tablet Commonly known as: HYDRODIURIL TAKE 1 TABLET BY MOUTH EVERY DAY   HYDROcodone-acetaminophen 5-325 MG tablet Commonly known as: NORCO/VICODIN Take 1-2 tablets by mouth every 6 (six) hours as needed for moderate pain.   memantine 10 MG tablet Commonly known as: NAMENDA Take 10 mg by  mouth 2 (two) times daily.   methocarbamol 500 MG tablet Commonly known as: ROBAXIN Take 1 tablet (500 mg total) by mouth 2 (two) times daily as needed for up to 15 days for muscle spasms.   multivitamin Tabs tablet Take 1 tablet by mouth daily. Start taking on: January 31, 2020 Replaces: multivitamin-lutein Caps capsule   mupirocin ointment 2 % Commonly known as: BACTROBAN Place 1 application into the nose 2 (two) times daily.   NP Thyroid 60 MG tablet Generic drug: thyroid TAKE 1 TABLET BY MOUTH EVERY DAY What changed: how much to take   polyethylene glycol 17 g packet Commonly known as: MIRALAX / GLYCOLAX Take 17 g by mouth daily as needed for mild constipation.   simvastatin 20 MG tablet Commonly known as: ZOCOR TAKE 1 TABLET BY MOUTH EVERY DAY What changed: when to take this            Durable Medical Equipment  (From admission, onward)         Start     Ordered   01/30/20 1518  DME 3-in-1   Once        01/30/20 1539   01/30/20 0000  For home use only DME 4 wheeled rolling walker with seat       Question:  Patient needs a walker to treat with the following condition  Answer:  Fracture closed, femur, shaft ([place])   01/30/20 1539           Discharge Care Instructions  (From admission, onward)         Start     Ordered   01/30/20 0000  No dressing needed        01/30/20 1539         Allergies  Allergen Reactions  . Penicillins Hives and Rash    Has patient had a PCN reaction causing immediate rash, facial/tongue/throat swelling, SOB or lightheadedness with hypotension: unknown Has patient had a PCN reaction causing severe rash involving mucus membranes or skin necrosis: unknown Has patient had a PCN reaction that required hospitalization: unknown Has patient had a PCN reaction occurring within the last 10 years: unknown  Patient tolerated Ancef without any complications.  . Hydrochlorothiazide Other (See Comments)    Dehydration    Follow-up Information    Swinteck, Aaron Edelman, MD. Schedule an appointment as soon as possible for a visit in 2 weeks.   Specialty: Orthopedic Surgery Why: For suture removal, For wound re-check Contact information: [address] [place] [address] [phone]                The results of significant diagnostics from this hospitalization (including imaging, microbiology, ancillary and laboratory) are listed below for reference.    Significant Diagnostic Studies: DG Arthro Hip Right  Result Date: 01/28/2020 CLINICAL DATA:  Right hip arthroplasty EXAM: DG C-ARM 1-60 MIN; ARTHROGRAM OF THE RIGHT HIP FLUOROSCOPY TIME:  Fluoroscopy Time:  10 seconds Radiation Exposure Index (if provided by the fluoroscopic device): 1.15 mGy COMPARISON:  Right hip radiographs dated 01/27/2020 FINDINGS: Intraoperative fluoroscopic images during right hip arthroplasty. Surgical hardware is in satisfactory position. Prior left hip  arthroplasty. IMPRESSION: Intraoperative fluoroscopic images during right hip arthroplasty. Electronically Signed   By: Julian Hy M.D.   On: 01/28/2020 10:29   Pelvis Portable  Result Date: 01/28/2020 CLINICAL DATA:  Postop displaced right femoral neck fracture EXAM: PORTABLE PELVIS 1-2 VIEWS COMPARISON:  01/27/2020 FINDINGS: Right hip arthroplasty, in satisfactory position. Overlying skin staples.  Prior left hip arthroplasty. Visualized bony pelvis appears intact. Suspected uterine fibroid overlying the left pelvis. IMPRESSION: Right hip arthroplasty, in satisfactory position. Electronically Signed   By: Charline Bills M.D.   On: 01/28/2020 11:39   Chest Portable 1 View  Result Date: 01/27/2020 CLINICAL DATA:  Preoperative assessment EXAM: PORTABLE CHEST 1 VIEW COMPARISON:  January 18, 2014. FINDINGS: Lungs are clear. Heart size and pulmonary vascularity are normal. No adenopathy. There is aortic atherosclerosis. No bone lesions. IMPRESSION: Lungs clear.  Cardiac silhouette normal. Aortic Atherosclerosis (ICD10-I70.0). Electronically Signed   By: Bretta Bang III M.D.   On: 01/27/2020 14:33   DG C-Arm 1-60 Min  Result Date: 01/28/2020 CLINICAL DATA:  Right hip arthroplasty EXAM: DG C-ARM 1-60 MIN; ARTHROGRAM OF THE RIGHT HIP FLUOROSCOPY TIME:  Fluoroscopy Time:  10 seconds Radiation Exposure Index (if provided by the fluoroscopic device): 1.15 mGy COMPARISON:  Right hip radiographs dated 01/27/2020 FINDINGS: Intraoperative fluoroscopic images during right hip arthroplasty. Surgical hardware is in satisfactory position. Prior left hip arthroplasty. IMPRESSION: Intraoperative fluoroscopic images during right hip arthroplasty. Electronically Signed   By: Charline Bills M.D.   On: 01/28/2020 10:29   DG Hip Unilat With Pelvis 2-3 Views Right  Result Date: 01/27/2020 CLINICAL DATA:  Fall.  Right hip pain. EXAM: DG HIP (WITH OR WITHOUT PELVIS) 2-3V RIGHT COMPARISON:  06/09/2016 CT.   01/26/2014 radiographs of the pelvis. FINDINGS: Deformity of the right femoral head/neck junction, most consistent with impacted subcapital fracture. No dislocation. Left hip arthroplasty. IMPRESSION: Right femoral neck fracture, likely subcapital. Electronically Signed   By: Jeronimo Greaves M.D.   On: 01/27/2020 12:56    Microbiology: Recent Results (from the past 240 hour(s))  SARS Coronavirus 2 by RT PCR (hospital order, performed in [facility] hospital lab) Nasopharyngeal Nasopharyngeal Swab     Status: None   Collection Time: 01/27/20  1:37 PM   Specimen: Nasopharyngeal Swab  Result Value Ref Range Status   SARS Coronavirus 2 NEGATIVE NEGATIVE Final    Comment: (NOTE) SARS-CoV-2 target nucleic acids are NOT DETECTED.  The SARS-CoV-2 RNA is generally detectable in upper and lower respiratory specimens during the acute phase of infection. The lowest concentration of SARS-CoV-2 viral copies this assay can detect is 250 copies / mL. A negative result does not preclude SARS-CoV-2 infection and should not be used as the sole basis for treatment or other patient management decisions.  A negative result may occur with improper specimen collection / handling, submission of specimen other than nasopharyngeal swab, presence of viral mutation(s) within the areas targeted by this assay, and inadequate number of viral copies (<250 copies / mL). A negative result must be combined with clinical observations, patient history, and epidemiological information.  Fact Sheet for Patients:   BoilerBrush.com.cy  Fact Sheet for Healthcare Providers: https://pope.com/  This test is not yet approved or  cleared by the [place] FDA and has been authorized for detection and/or diagnosis of SARS-CoV-2 by FDA under an Emergency Use Authorization (EUA).  This EUA will remain in effect (meaning this test can be used) for the duration of the COVID-19  declaration under Section 564(b)(1) of the Act, 21 U.S.C. section 360bbb-3(b)(1), unless the authorization is terminated or revoked sooner.  Performed at [facility], [address], [place], [place] [address]   Surgical PCR screen     Status: None   Collection Time: 01/27/20 11:36 PM   Specimen: Nasal Mucosa; Nasal Swab  Result Value Ref Range Status  MRSA, PCR NEGATIVE NEGATIVE Final   Staphylococcus aureus NEGATIVE NEGATIVE Final    Comment: (NOTE) The Xpert SA Assay (FDA approved for NASAL specimens in patients 36 years of age and older), is one component of a comprehensive surveillance program. It is not intended to diagnose infection nor to guide or monitor treatment. Performed at [facility], [place] [address], [place], [place] [address]      Labs: Basic Metabolic Panel: Recent Labs  Lab 01/27/20 1357 01/28/20 1309 01/29/20 0221 01/30/20 0433  NA 136  --  135 134*  K 3.1*  --  3.8 3.8  CL 100  --  102 102  CO2 25  --  24 22  GLUCOSE 106*  --  109* 100*  BUN 19  --  22 17  CREATININE 0.83 0.80 0.99 0.78  CALCIUM 8.8*  --  8.0* 8.4*   Liver Function Tests: Recent Labs  Lab 01/27/20 1357  AST 19  ALT 14  ALKPHOS 55  BILITOT 1.0  PROT 6.3*  ALBUMIN 3.7   No results for input(s): LIPASE, AMYLASE in the last 168 hours. No results for input(s): AMMONIA in the last 168 hours. CBC: Recent Labs  Lab 01/27/20 1355 01/28/20 1309 01/29/20 0221 01/30/20 0433  WBC 9.0 10.9* 7.0 6.2  NEUTROABS 7.2  --   --   --   HGB 12.9 10.4* 9.0* 9.2*  HCT 38.8 32.2* 26.4* 27.5*  MCV 89.4 91.0 88.3 87.9  PLT 141* 101* 92* 106*   Cardiac Enzymes: No results for input(s): CKTOTAL, CKMB, CKMBINDEX, TROPONINI in the last 168 hours. BNP: BNP (last 3 results) No results for input(s): BNP in the last 8760 hours.  ProBNP (last 3 results) No results for input(s): PROBNP in the last 8760 hours.  CBG: No results for input(s): GLUCAP in the last 168  hours.     Signed:  Cristal Deer, MD [facility] 01/30/2020, 3:40 PM

## 2020-01-30 NOTE — Progress Notes (Signed)
Physical Therapy Treatment Patient Details Name: Jackie Phelps MRN: [id number] DOB: [birth year] Today's Date: 01/30/2020    History of Present Illness Pt is an 87 y.o. female admitted 01/27/20 after fall sustaining R hip pain. S/p R THA (direct anterior) 1/22. PMH includes dementia, anxiety, depression, HTN, OSA.   PT Comments    Pt seen for additional session now that daughter present for education; pt preparing for d/c home this afternoon. Pt and daughter declined formal stair training; educ on safe technique to ascend 3 steps without rail into home with daughter's assist, as well as flight of steps with rail up to bedroom. Educ pt and daughter re: precautions, positioning, therex, fall risk reduction, activity recommendations. Pt and daughter have no further questions or concerns.    Follow Up Recommendations  Home health PT;Supervision/Assistance - 24 hour (declined)     Equipment Recommendations  Rolling walker with 5" wheels    Recommendations for Other Services       Precautions / Restrictions Precautions Precautions: Fall Precaution Comments: baseline dementia Restrictions Weight Bearing Restrictions: Yes RLE Weight Bearing: Weight bearing as tolerated    Mobility  Bed Mobility               General bed mobility comments: Received sitting in recliner  Transfers Overall transfer level: Needs assistance Equipment used: Rolling walker (2 wheeled) Transfers: Sit to/from Stand Sit to Stand: Min assist         General transfer comment: pt completed x3 sit<>stands from recliner needing cues for hand placement each trial , MIN A to power up and for initial steadying assist  Ambulation/Gait                 Stairs Stairs:  (pt and daughter declined formal stair training; educ on safe technique to ascend 3 steps into home without rail, as well as flight of steps up to bedroom with rail)           Wheelchair Mobility    Modified Rankin (Stroke  Patients Only)       Balance Overall balance assessment: Needs assistance Sitting-balance support: No upper extremity supported;Feet supported Sitting balance-Leahy Scale: Fair     Standing balance support: Bilateral upper extremity supported;During functional activity Standing balance-Leahy Scale: Poor Standing balance comment: reliant on UE support                            Cognition Arousal/Alertness: Awake/alert Behavior During Therapy: WFL for tasks assessed/performed Overall Cognitive Status: History of cognitive impairments - at baseline Area of Impairment: Orientation;Attention;Memory;Following commands;Safety/judgement;Awareness                 Orientation Level: Disoriented to;Place Current Attention Level: Sustained Memory: Decreased recall of precautions;Decreased short-term memory Following Commands: Follows one step commands with increased time Safety/Judgement: Decreased awareness of safety;Decreased awareness of deficits Awareness: Intellectual Problem Solving: Requires verbal cues General Comments: baseline dementia      Exercises      General Comments General comments (skin integrity, edema, etc.): Daughter present this session for educ. Educ pt and daughter re: precautions (clarified pt does not have anterior hip precautions; is WBAT), positioning, fall risk reduction (use of BSC at bedside for nightly trip to bathroom), therex (pt and daughter familiar with HEP from prior hip fx), activity recommendations, DME needs, importance of mobility      Pertinent Vitals/Pain Pain Assessment: Faces Faces Pain Scale: Hurts a little bit Pain Location: R  hip Pain Descriptors / Indicators: Discomfort;Guarding Pain Intervention(s): Monitored during session    Home Living                      Prior Function            PT Goals (current goals can now be found in the care plan section) Acute Rehab PT Goals Patient Stated Goal: return  home Progress towards PT goals: Progressing toward goals    Frequency    Min 5X/week      PT Plan Current plan remains appropriate    Co-evaluation              AM-PAC PT "6 Clicks" Mobility   Outcome Measure  Help needed turning from your back to your side while in a flat bed without using bedrails?: A Little Help needed moving from lying on your back to sitting on the side of a flat bed without using bedrails?: A Little Help needed moving to and from a bed to a chair (including a wheelchair)?: A Little Help needed standing up from a chair using your arms (e.g., wheelchair or bedside chair)?: A Little Help needed to walk in hospital room?: A Little Help needed climbing 3-5 steps with a railing? : A Little 6 Click Score: 18    End of Session   Activity Tolerance: Patient tolerated treatment well Patient left: in chair;with call bell/phone within reach;with chair alarm set;with family/visitor present Nurse Communication: Mobility status PT Visit Diagnosis: Unsteadiness on feet (R26.81);Muscle weakness (generalized) (M62.81);History of falling (Z91.81);Other abnormalities of gait and mobility (R26.89);Difficulty in walking, not elsewhere classified (R26.2);Pain Pain - Right/Left: Right Pain - part of body: Leg     Time: 4081-4481 PT Time Calculation (min) (ACUTE ONLY): 12 min  Charges:  $Self Care/Home Management: [place], PT, DPT Acute Rehabilitation Services  Pager [phone] Office [place] 01/30/2020, 5:29 PM

## 2020-01-30 NOTE — Progress Notes (Signed)
Physical Therapy Treatment Patient Details Name: Jackie Phelps MRN: [id number] DOB: 03-Jul-1931 Today's Date: 01/30/2020    History of Present Illness Pt is an 87 y.o. female admitted 01/27/20 after fall sustaining R hip pain. S/p R THA (direct anterior) 1/22. PMH includes dementia, anxiety, depression, HTN, OSA.   PT Comments    Pt progressing with mobility. Today's session focused on transfer and gait training with RW, as well as LE strengthening. Pt remains limited by pain, generalized weakness, decreased activity tolerance and impaired balance strategies/postural reactions; requires up to [facility] for standing. Daughter plans to have pt return home with 24/7 assist from family; continue to recommend follow-up with HHPT services.     Follow Up Recommendations  Home health PT;Supervision/Assistance - 24 hour     Equipment Recommendations  Rolling walker with 5" wheels;3in1 (PT)    Recommendations for Other Services       Precautions / Restrictions Precautions Precautions: Fall Restrictions Weight Bearing Restrictions: Yes RLE Weight Bearing: Weight bearing as tolerated    Mobility  Bed Mobility Overal bed mobility: Needs Assistance Bed Mobility: Supine to Sit     Supine to sit: Mod assist;HOB elevated     General bed mobility comments: Frequent cues to remind pt of task, cues for sequencing; increased time and effort for RLE management; modA to assist trunk elevation and scoot hips to EOB  Transfers Overall transfer level: Needs assistance Equipment used: Rolling walker (2 wheeled) Transfers: Sit to/from Stand Sit to Stand: Mod assist;Min assist         General transfer comment: Initial modA to assist trunk elevation standing from EOB to RW, pt limited by RLE pain; multiple trials from recliner with RW, fluctuating from min-modA; repeated cues for sequencing, hand placement and control of eccentric lowering; pt with inconsistent carryover of  instructions  Ambulation/Gait Ambulation/Gait assistance: Min assist;Min guard Gait Distance (Feet): 40 Feet (+20) Assistive device: Rolling walker (2 wheeled) Gait Pattern/deviations: Step-to pattern;Decreased weight shift to right;Trunk flexed;Antalgic Gait velocity: Decreased   General Gait Details: Slow, antalgic gait with RW and close min guard for balance; cues to maintain upright posture and hand placement as pt attempting to lean forward on RW. 1x minA to prevent LOB, pt tends to speed up when getting towards chair to sit; walked 20' + 20' with 1x seated rest break secondary to pain and fatigue   Stairs             Wheelchair Mobility    Modified Rankin (Stroke Patients Only)       Balance Overall balance assessment: Needs assistance Sitting-balance support: No upper extremity supported;Feet supported Sitting balance-Leahy Scale: Fair     Standing balance support: Bilateral upper extremity supported;During functional activity Standing balance-Leahy Scale: Poor Standing balance comment: reliant on UE support                            Cognition Arousal/Alertness: Awake/alert Behavior During Therapy: WFL for tasks assessed/performed Overall Cognitive Status: History of cognitive impairments - at baseline Area of Impairment: Orientation;Attention;Memory;Following commands;Safety/judgement;Awareness;Problem solving                 Orientation Level: Disoriented to;Place;Time;Situation Current Attention Level: Sustained Memory: Decreased recall of precautions;Decreased short-term memory Following Commands: Follows one step commands with increased time Safety/Judgement: Decreased awareness of safety;Decreased awareness of deficits Awareness: Intellectual Problem Solving: Requires verbal cues General Comments: H/o dementia. Poor memory limiting carryover of instructions, required frequent cues/direction for  technique and to stay on task       Exercises General Exercises - Lower Extremity Ankle Circles/Pumps: AROM;Both Long Arc Quad: AROM;Both;Seated Toe Raises: AROM;Both;Seated Heel Raises: AROM;Both;Seated Other Exercises Other Exercises: 5x sit<>stand to RW (heavy reliance on UE support)    General Comments General comments (skin integrity, edema, etc.): SpO2 92% on RA, HR 83      Pertinent Vitals/Pain Pain Assessment: Faces Faces Pain Scale: Hurts little more Pain Location: R hip Pain Descriptors / Indicators: Discomfort;Grimacing;Guarding Pain Intervention(s): Monitored during session;Repositioned    Home Living                      Prior Function            PT Goals (current goals can now be found in the care plan section) Progress towards PT goals: Progressing toward goals    Frequency    Min 5X/week      PT Plan Frequency needs to be updated    Co-evaluation              AM-PAC PT "6 Clicks" Mobility   Outcome Measure  Help needed turning from your back to your side while in a flat bed without using bedrails?: A Little Help needed moving from lying on your back to sitting on the side of a flat bed without using bedrails?: A Lot Help needed moving to and from a bed to a chair (including a wheelchair)?: A Little Help needed standing up from a chair using your arms (e.g., wheelchair or bedside chair)?: A Lot Help needed to walk in hospital room?: A Little Help needed climbing 3-5 steps with a railing? : A Little 6 Click Score: 16    End of Session Equipment Utilized During Treatment: Gait belt Activity Tolerance: Patient tolerated treatment well;Patient limited by fatigue Patient left: in chair;with call bell/phone within reach;with chair alarm set Nurse Communication: Mobility status PT Visit Diagnosis: Unsteadiness on feet (R26.81);Muscle weakness (generalized) (M62.81);History of falling (Z91.81);Other abnormalities of gait and mobility (R26.89);Difficulty in walking, not  elsewhere classified (R26.2);Pain Pain - Right/Left: Right Pain - part of body: Leg     Time: 1001-1026 PT Time Calculation (min) (ACUTE ONLY): 25 min  Charges:  $Therapeutic Exercise: 8-22 mins $Therapeutic Activity: 8-22 mins                    Mabeline Caras, PT, DPT Acute Rehabilitation Services  Pager [phone] Office [place] 01/30/2020, 12:12 PM

## 2020-01-30 NOTE — Progress Notes (Signed)
Occupational Therapy Treatment Patient Details Name: Jackie Phelps MRN: [id number] DOB: 1931-10-05 Today's Date: 01/30/2020    History of present illness Pt is an 87 y.o. female admitted 01/27/20 after fall sustaining R hip pain. S/p R THA (direct anterior) 1/22. PMH includes dementia, anxiety, depression, HTN, OSA.   OT comments  Pt making steady progress towards OT goals this session.Pt continues to present with baseline dementia, increased pain and impaired balance impacting pts ability to complete BADLs independently. Pt currently requires MIN A for sit<>stand from recliner with RW, supervision for seated UB ADLs and MOD A for LB ADLs. Education provided on AE for home, DME placement for home ( dtr reports pt has tub bench at home and [facility]). Daughter endorses that pt will have 24/7 assist for safety at home. Discussed/ demonstrated car transfer and navigating stairs at home. Feel pt would benefit from [facility] although noted pt declining HH, Will continue to follow acutely per POC.    Follow Up Recommendations  Home health OT    Equipment Recommendations  3 in 1 bedside commode    Recommendations for Other Services      Precautions / Restrictions Precautions Precautions: Fall Precaution Comments: baseline dementia Restrictions Weight Bearing Restrictions: Yes RLE Weight Bearing: Weight bearing as tolerated       Mobility Bed Mobility               General bed mobility comments: pt recieved in recliner and returned to recliner  Transfers Overall transfer level: Needs assistance Equipment used: Rolling walker (2 wheeled) Transfers: Sit to/from Stand Sit to Stand: Min assist         General transfer comment: pt completed x3 sit<>stands from recliner needing cues for hand placement each trial , MIN A to power up and for initial steadying assist    Balance Overall balance assessment: Needs assistance Sitting-balance support: No upper extremity supported;Feet  supported Sitting balance-Leahy Scale: Fair     Standing balance support: Bilateral upper extremity supported;During functional activity Standing balance-Leahy Scale: Poor Standing balance comment: reliant on UE support                           ADL either performed or assessed with clinical judgement   ADL Overall ADL's : Needs assistance/impaired               Lower Body Bathing Details (indicate cue type and reason): education provided on using LH sponge for LB bathing as pain mgmt strategy Upper Body Dressing : Supervision/safety;Sitting Upper Body Dressing Details (indicate cue type and reason): to don [place] shirt from recliner Lower Body Dressing: Moderate assistance;Sit to/from stand Lower Body Dressing Details (indicate cue type and reason): MOD A needed to thread RLE through pants from recliner, light MINguard for balance when pt pulling pants up to waist line in standing, MIN A to power into standing needing cues for hand placement Toilet Transfer: Minimal assistance;RW;Ambulation         Tub/Shower Transfer Details (indicate cue type and reason): pt reports tub shower at home with tub bench Functional mobility during ADLs: Minimal assistance;Rolling walker;Cueing for safety;Cueing for sequencing General ADL Comments: session focus on UB/LB dressing, functional mobility, education related to DME energy conservation strategies and safety at home     Vision       Perception     Praxis      Cognition Arousal/Alertness: Awake/alert Behavior During Therapy: [facility] for tasks assessed/performed Overall Cognitive  Status: History of cognitive impairments - at baseline Area of Impairment: Orientation;Attention;Memory;Following commands;Safety/judgement;Awareness                 Orientation Level: Disoriented to;Place (pt asking OTA 2x if we are in [place]) Current Attention Level: Sustained Memory: Decreased recall of precautions;Decreased short-term  memory (no recall of hand positioning) Following Commands: Follows one step commands with increased time Safety/Judgement: Decreased awareness of safety;Decreased awareness of deficits Awareness: Intellectual Problem Solving: Requires verbal cues General Comments: baseline dementia, needs cues for safety throughout mobility        Exercises     Shoulder Instructions       General Comments vss, pts daughter present during session.    Pertinent Vitals/ Pain       Pain Assessment: Faces Faces Pain Scale: Hurts little more Pain Location: R hip when lowering legs down in recliner Pain Descriptors / Indicators: Discomfort;Grimacing;Guarding;Sore Pain Intervention(s): Monitored during session;Repositioned  Home Living                                          Prior Functioning/Environment              Frequency  Min 2X/week        Progress Toward Goals  OT Goals(current goals can now be found in the care plan section)  Progress towards OT goals: Progressing toward goals  Acute Rehab OT Goals Patient Stated Goal: return home OT Goal Formulation: With patient Time For Goal Achievement: 02/11/20 Potential to Achieve Goals: Good ADL Goals Pt Will Perform Grooming: with min guard assist;standing Pt Will Perform Lower Body Dressing: with min guard assist;sit to/from stand Pt Will Transfer to Toilet: with min guard assist;bedside commode;ambulating Pt Will Perform Toileting - Clothing Manipulation and hygiene: with min guard assist;sit to/from stand  Plan Discharge plan remains appropriate;Frequency remains appropriate    Co-evaluation                 AM-PAC OT "6 Clicks" Daily Activity     Outcome Measure   Help from another person eating meals?: None Help from another person taking care of personal grooming?: A Little Help from another person toileting, which includes using toliet, bedpan, or urinal?: A Little Help from another person  bathing (including washing, rinsing, drying)?: A Lot Help from another person to put on and taking off regular upper body clothing?: None Help from another person to put on and taking off regular lower body clothing?: A Little 6 Click Score: 19    End of Session Equipment Utilized During Treatment: Rolling walker  OT Visit Diagnosis: Unsteadiness on feet (R26.81);Pain Pain - Right/Left: Right Pain - part of body: Leg   Activity Tolerance Patient tolerated treatment well   Patient Left in chair;with call bell/phone within reach;with family/visitor present   Nurse Communication Mobility status;Other (comment) (pt dressed and ready for DC)        Time: 2423-5361 OT Time Calculation (min): 46 min  Charges: OT General Charges $OT Visit: 1 Visit OT Treatments $Self Care/Home Management : 38-52 mins  Harley Alto., COTA/L Acute Rehabilitation Services [phone]    Precious Haws 01/30/2020, 5:12 PM

## 2020-01-30 NOTE — Progress Notes (Signed)
Discharge package printed and instructions given to daughter. Daughter verbalizes understanding.

## 2020-01-30 NOTE — Progress Notes (Signed)
    Subjective:  Patient reports pain as mild to moderate.  Denies N/V/CP/SOB. Foley is in place  Objective:   VITALS:   Vitals:   01/29/20 1500 01/29/20 1929 01/30/20 0419 01/30/20 0819  BP: (!) 120/47 (!) 154/60 (!) 182/65 (!) 154/74  Pulse: 72 72 75 75  Resp:  18 16 16   Temp: 97.6 F (36.4 C) 99.2 F (37.3 C) 99 F (37.2 C) 99.8 F (37.7 C)  TempSrc: Oral Oral Oral Oral  SpO2: 96% 92% 90% 93%  Weight:      Height:        NAD ABD soft Neurovascular intact Sensation intact distally Intact pulses distally Dorsiflexion/Plantar flexion intact Incision: dressing C/D/I   Lab Results  Component Value Date   WBC 6.2 01/30/2020   HGB 9.2 (L) 01/30/2020   HCT 27.5 (L) 01/30/2020   MCV 87.9 01/30/2020   PLT 106 (L) 01/30/2020   BMET    Component Value Date/Time   NA 134 (L) 01/30/2020 0433   K 3.8 01/30/2020 0433   CL 102 01/30/2020 0433   CO2 22 01/30/2020 0433   GLUCOSE 100 (H) 01/30/2020 0433   BUN 17 01/30/2020 0433   CREATININE 0.78 01/30/2020 0433   CREATININE 0.99 (H) 12/19/2019 1352   CALCIUM 8.4 (L) 01/30/2020 0433   GFRNONAA >60 01/30/2020 0433   GFRNONAA 52 (L) 12/19/2019 1352   GFRAA 60 12/19/2019 1352     Assessment/Plan: 2 Days Post-Op   Active Problems:   Other fracture of right femur, initial encounter for closed fracture ([place])   WBAT with walker DVT ppx: Aspirin and Lovenox, SCDs, TEDS   ASA 81mg  BID at D/C PO pain control PT/OT Dispo: D/C home once cleared by therapy and hospitalist.  Follow up with Dr.Swinteck's clinic in 2 weeks for staple removal and radiographs     Jackie Phelps 01/30/2020, 11:20 AM [place] is now [employer] [address], [place], [place] [address] Phone: [phone] [email or web address] Facebook  [employer]

## 2020-01-30 NOTE — TOC Transition Note (Signed)
Transition of Care [facility]) - CM/SW Discharge Note   Patient Details  Name: Jackie Phelps MRN: [id number] Date of Birth: 10/31/1931  [facility] CM/SW Contact:  Sharin Mons, RN Phone Number: 01/30/2020, 4:05 PM   Clinical Narrative:    Patient will DC to: home Anticipated DC date: 01/30/2020 Family notified: daughter Transport by: car  Admitted 01/27/20 after fall sustaining R hip pain. S/p R THA (direct anterior) 1/22. PMH includes dementia, anxiety, depression, HTN, OSA.  Per MD patient ready for DC today. RN, patient, and patient's family awareof DC. Pt declined [place] services. Daughter Raquel Sarna states pt will have 24/7 supervision and assist provided by family once d/c. Nurse to provide pt with DME rolling walker from floor stock.  Post hospital f/u noted on AVS. Pt without Rx med concerns or affordability,  RNCM will sign off for now as intervention is no longer needed. Please consult [place] again if new needs arise.   Final next level of care: Home/Self Care (declined [facility]) Barriers to Discharge: No Barriers Identified   Patient Goals and CMS Choice     Choice offered to / list presented to : Patient  Discharge Placement                       Discharge Plan and Services                    Date DME Agency Contacted: 01/30/20 Time DME Agency Contacted: [phone] Representative spoke with at DME Agency: Beside Nurse to give from floor stock            Social Determinants of Health ([place]) Interventions     Readmission Risk Interventions No flowsheet data found.

## 2020-01-30 NOTE — Plan of Care (Signed)
  Problem: Pain Managment: Goal: General experience of comfort will improve Outcome: Progressing   Problem: Safety: Goal: Ability to remain free from injury will improve Outcome: Progressing   

## 2020-01-31 ENCOUNTER — Telehealth (INDEPENDENT_AMBULATORY_CARE_PROVIDER_SITE_OTHER): Payer: Self-pay

## 2020-01-31 ENCOUNTER — Encounter (INDEPENDENT_AMBULATORY_CARE_PROVIDER_SITE_OTHER): Payer: Self-pay | Admitting: Internal Medicine

## 2020-01-31 NOTE — Telephone Encounter (Signed)
I am not surprised.  Good we need to do next?

## 2020-01-31 NOTE — Telephone Encounter (Signed)
Jackie Phelps called in great distress and anxious. She is not able to care for both. She made a horrible mistake. So she is needing to get her setup ASAP.

## 2020-01-31 NOTE — Telephone Encounter (Signed)
Have the FL-2 form I am filling out then we can send over to [facility].

## 2020-01-31 NOTE — Telephone Encounter (Signed)
Okay thanks, just let me know when I need to fill it up

## 2020-02-01 NOTE — Telephone Encounter (Signed)
Thanks

## 2020-02-01 NOTE — Telephone Encounter (Signed)
Return call to [employer] today. To notify we went over the FL-2 Form for [facility] re[facility]. Ask her to contact [facility] to see what they may need from her now to start process.

## 2020-02-02 ENCOUNTER — Encounter (INDEPENDENT_AMBULATORY_CARE_PROVIDER_SITE_OTHER): Payer: Self-pay | Admitting: Internal Medicine

## 2020-02-02 DIAGNOSIS — R69 Illness, unspecified: Secondary | ICD-10-CM | POA: Diagnosis not present

## 2020-02-06 ENCOUNTER — Encounter (INDEPENDENT_AMBULATORY_CARE_PROVIDER_SITE_OTHER): Payer: Self-pay | Admitting: Internal Medicine

## 2020-02-06 DIAGNOSIS — K5900 Constipation, unspecified: Secondary | ICD-10-CM | POA: Diagnosis not present

## 2020-02-06 DIAGNOSIS — Z9181 History of falling: Secondary | ICD-10-CM | POA: Diagnosis not present

## 2020-02-06 DIAGNOSIS — K5909 Other constipation: Secondary | ICD-10-CM | POA: Diagnosis not present

## 2020-02-06 DIAGNOSIS — R2689 Other abnormalities of gait and mobility: Secondary | ICD-10-CM | POA: Diagnosis not present

## 2020-02-06 DIAGNOSIS — E785 Hyperlipidemia, unspecified: Secondary | ICD-10-CM | POA: Diagnosis not present

## 2020-02-06 DIAGNOSIS — S72001D Fracture of unspecified part of neck of right femur, subsequent encounter for closed fracture with routine healing: Secondary | ICD-10-CM | POA: Diagnosis not present

## 2020-02-06 DIAGNOSIS — M6281 Muscle weakness (generalized): Secondary | ICD-10-CM | POA: Diagnosis not present

## 2020-02-06 DIAGNOSIS — G309 Alzheimer's disease, unspecified: Secondary | ICD-10-CM | POA: Diagnosis not present

## 2020-02-06 DIAGNOSIS — S728X1D Other fracture of right femur, subsequent encounter for closed fracture with routine healing: Secondary | ICD-10-CM | POA: Diagnosis not present

## 2020-02-06 DIAGNOSIS — I10 Essential (primary) hypertension: Secondary | ICD-10-CM | POA: Diagnosis not present

## 2020-02-06 DIAGNOSIS — M25551 Pain in right hip: Secondary | ICD-10-CM | POA: Diagnosis not present

## 2020-02-07 DIAGNOSIS — M25551 Pain in right hip: Secondary | ICD-10-CM | POA: Diagnosis not present

## 2020-02-07 DIAGNOSIS — I10 Essential (primary) hypertension: Secondary | ICD-10-CM | POA: Diagnosis not present

## 2020-02-07 DIAGNOSIS — E785 Hyperlipidemia, unspecified: Secondary | ICD-10-CM | POA: Diagnosis not present

## 2020-02-07 DIAGNOSIS — K5900 Constipation, unspecified: Secondary | ICD-10-CM | POA: Diagnosis not present

## 2020-02-14 ENCOUNTER — Other Ambulatory Visit (INDEPENDENT_AMBULATORY_CARE_PROVIDER_SITE_OTHER): Payer: Self-pay | Admitting: Internal Medicine

## 2020-02-21 DIAGNOSIS — S72001D Fracture of unspecified part of neck of right femur, subsequent encounter for closed fracture with routine healing: Secondary | ICD-10-CM | POA: Diagnosis not present

## 2020-02-24 DIAGNOSIS — K5909 Other constipation: Secondary | ICD-10-CM | POA: Diagnosis not present

## 2020-02-24 DIAGNOSIS — E785 Hyperlipidemia, unspecified: Secondary | ICD-10-CM | POA: Diagnosis not present

## 2020-02-24 DIAGNOSIS — G309 Alzheimer's disease, unspecified: Secondary | ICD-10-CM | POA: Diagnosis not present

## 2020-02-24 DIAGNOSIS — E039 Hypothyroidism, unspecified: Secondary | ICD-10-CM | POA: Diagnosis not present

## 2020-02-24 DIAGNOSIS — E876 Hypokalemia: Secondary | ICD-10-CM | POA: Diagnosis not present

## 2020-02-24 DIAGNOSIS — S72002D Fracture of unspecified part of neck of left femur, subsequent encounter for closed fracture with routine healing: Secondary | ICD-10-CM | POA: Diagnosis not present

## 2020-02-24 DIAGNOSIS — E559 Vitamin D deficiency, unspecified: Secondary | ICD-10-CM | POA: Diagnosis not present

## 2020-02-24 DIAGNOSIS — Z9989 Dependence on other enabling machines and devices: Secondary | ICD-10-CM | POA: Diagnosis not present

## 2020-02-24 DIAGNOSIS — I10 Essential (primary) hypertension: Secondary | ICD-10-CM | POA: Diagnosis not present

## 2020-02-24 DIAGNOSIS — W19XXXD Unspecified fall, subsequent encounter: Secondary | ICD-10-CM | POA: Diagnosis not present

## 2020-02-24 DIAGNOSIS — G4733 Obstructive sleep apnea (adult) (pediatric): Secondary | ICD-10-CM | POA: Diagnosis not present

## 2020-02-27 ENCOUNTER — Telehealth (INDEPENDENT_AMBULATORY_CARE_PROVIDER_SITE_OTHER): Payer: Self-pay

## 2020-02-27 DIAGNOSIS — E559 Vitamin D deficiency, unspecified: Secondary | ICD-10-CM | POA: Diagnosis not present

## 2020-02-27 DIAGNOSIS — E039 Hypothyroidism, unspecified: Secondary | ICD-10-CM | POA: Diagnosis not present

## 2020-02-27 DIAGNOSIS — Z9989 Dependence on other enabling machines and devices: Secondary | ICD-10-CM | POA: Diagnosis not present

## 2020-02-27 DIAGNOSIS — G309 Alzheimer's disease, unspecified: Secondary | ICD-10-CM | POA: Diagnosis not present

## 2020-02-27 DIAGNOSIS — W19XXXD Unspecified fall, subsequent encounter: Secondary | ICD-10-CM | POA: Diagnosis not present

## 2020-02-27 DIAGNOSIS — G4733 Obstructive sleep apnea (adult) (pediatric): Secondary | ICD-10-CM | POA: Diagnosis not present

## 2020-02-27 DIAGNOSIS — K5909 Other constipation: Secondary | ICD-10-CM | POA: Diagnosis not present

## 2020-02-27 DIAGNOSIS — E876 Hypokalemia: Secondary | ICD-10-CM | POA: Diagnosis not present

## 2020-02-27 DIAGNOSIS — I10 Essential (primary) hypertension: Secondary | ICD-10-CM | POA: Diagnosis not present

## 2020-02-27 DIAGNOSIS — S72002D Fracture of unspecified part of neck of left femur, subsequent encounter for closed fracture with routine healing: Secondary | ICD-10-CM | POA: Diagnosis not present

## 2020-02-27 DIAGNOSIS — E785 Hyperlipidemia, unspecified: Secondary | ICD-10-CM | POA: Diagnosis not present

## 2020-02-27 NOTE — Telephone Encounter (Signed)
Jackie Phelps from [facility] is requesting a verbal OK for approval for medication management, safety, and nutrition management. I went and asked for a verbal from Dr. Anastasio Champion and he gave a verbal OK for all of the requested. Jackie Phelps verbalized an understanding.

## 2020-02-29 DIAGNOSIS — I10 Essential (primary) hypertension: Secondary | ICD-10-CM | POA: Diagnosis not present

## 2020-02-29 DIAGNOSIS — S72002D Fracture of unspecified part of neck of left femur, subsequent encounter for closed fracture with routine healing: Secondary | ICD-10-CM | POA: Diagnosis not present

## 2020-02-29 DIAGNOSIS — E785 Hyperlipidemia, unspecified: Secondary | ICD-10-CM | POA: Diagnosis not present

## 2020-02-29 DIAGNOSIS — K5909 Other constipation: Secondary | ICD-10-CM | POA: Diagnosis not present

## 2020-02-29 DIAGNOSIS — G309 Alzheimer's disease, unspecified: Secondary | ICD-10-CM | POA: Diagnosis not present

## 2020-02-29 DIAGNOSIS — E876 Hypokalemia: Secondary | ICD-10-CM | POA: Diagnosis not present

## 2020-02-29 DIAGNOSIS — Z9989 Dependence on other enabling machines and devices: Secondary | ICD-10-CM | POA: Diagnosis not present

## 2020-02-29 DIAGNOSIS — W19XXXD Unspecified fall, subsequent encounter: Secondary | ICD-10-CM | POA: Diagnosis not present

## 2020-02-29 DIAGNOSIS — E039 Hypothyroidism, unspecified: Secondary | ICD-10-CM | POA: Diagnosis not present

## 2020-02-29 DIAGNOSIS — E559 Vitamin D deficiency, unspecified: Secondary | ICD-10-CM | POA: Diagnosis not present

## 2020-02-29 DIAGNOSIS — G4733 Obstructive sleep apnea (adult) (pediatric): Secondary | ICD-10-CM | POA: Diagnosis not present

## 2020-03-01 DIAGNOSIS — E559 Vitamin D deficiency, unspecified: Secondary | ICD-10-CM | POA: Diagnosis not present

## 2020-03-01 DIAGNOSIS — W19XXXD Unspecified fall, subsequent encounter: Secondary | ICD-10-CM | POA: Diagnosis not present

## 2020-03-01 DIAGNOSIS — E785 Hyperlipidemia, unspecified: Secondary | ICD-10-CM | POA: Diagnosis not present

## 2020-03-01 DIAGNOSIS — E039 Hypothyroidism, unspecified: Secondary | ICD-10-CM | POA: Diagnosis not present

## 2020-03-01 DIAGNOSIS — G4733 Obstructive sleep apnea (adult) (pediatric): Secondary | ICD-10-CM | POA: Diagnosis not present

## 2020-03-01 DIAGNOSIS — S72002D Fracture of unspecified part of neck of left femur, subsequent encounter for closed fracture with routine healing: Secondary | ICD-10-CM | POA: Diagnosis not present

## 2020-03-01 DIAGNOSIS — G309 Alzheimer's disease, unspecified: Secondary | ICD-10-CM | POA: Diagnosis not present

## 2020-03-01 DIAGNOSIS — K5909 Other constipation: Secondary | ICD-10-CM | POA: Diagnosis not present

## 2020-03-01 DIAGNOSIS — I10 Essential (primary) hypertension: Secondary | ICD-10-CM | POA: Diagnosis not present

## 2020-03-01 DIAGNOSIS — Z9989 Dependence on other enabling machines and devices: Secondary | ICD-10-CM | POA: Diagnosis not present

## 2020-03-01 DIAGNOSIS — E876 Hypokalemia: Secondary | ICD-10-CM | POA: Diagnosis not present

## 2020-03-05 ENCOUNTER — Ambulatory Visit (INDEPENDENT_AMBULATORY_CARE_PROVIDER_SITE_OTHER): Payer: Medicare Other | Admitting: Internal Medicine

## 2020-03-05 DIAGNOSIS — E785 Hyperlipidemia, unspecified: Secondary | ICD-10-CM | POA: Diagnosis not present

## 2020-03-05 DIAGNOSIS — G4733 Obstructive sleep apnea (adult) (pediatric): Secondary | ICD-10-CM | POA: Diagnosis not present

## 2020-03-05 DIAGNOSIS — K5909 Other constipation: Secondary | ICD-10-CM | POA: Diagnosis not present

## 2020-03-05 DIAGNOSIS — Z9989 Dependence on other enabling machines and devices: Secondary | ICD-10-CM | POA: Diagnosis not present

## 2020-03-05 DIAGNOSIS — W19XXXD Unspecified fall, subsequent encounter: Secondary | ICD-10-CM | POA: Diagnosis not present

## 2020-03-05 DIAGNOSIS — S72002D Fracture of unspecified part of neck of left femur, subsequent encounter for closed fracture with routine healing: Secondary | ICD-10-CM | POA: Diagnosis not present

## 2020-03-05 DIAGNOSIS — E559 Vitamin D deficiency, unspecified: Secondary | ICD-10-CM | POA: Diagnosis not present

## 2020-03-05 DIAGNOSIS — E039 Hypothyroidism, unspecified: Secondary | ICD-10-CM | POA: Diagnosis not present

## 2020-03-05 DIAGNOSIS — E876 Hypokalemia: Secondary | ICD-10-CM | POA: Diagnosis not present

## 2020-03-05 DIAGNOSIS — I10 Essential (primary) hypertension: Secondary | ICD-10-CM | POA: Diagnosis not present

## 2020-03-05 DIAGNOSIS — G309 Alzheimer's disease, unspecified: Secondary | ICD-10-CM | POA: Diagnosis not present

## 2020-03-07 ENCOUNTER — Ambulatory Visit (INDEPENDENT_AMBULATORY_CARE_PROVIDER_SITE_OTHER): Payer: Medicare Other | Admitting: Internal Medicine

## 2020-03-07 DIAGNOSIS — W19XXXD Unspecified fall, subsequent encounter: Secondary | ICD-10-CM | POA: Diagnosis not present

## 2020-03-07 DIAGNOSIS — Z9989 Dependence on other enabling machines and devices: Secondary | ICD-10-CM | POA: Diagnosis not present

## 2020-03-07 DIAGNOSIS — I10 Essential (primary) hypertension: Secondary | ICD-10-CM | POA: Diagnosis not present

## 2020-03-07 DIAGNOSIS — S72002D Fracture of unspecified part of neck of left femur, subsequent encounter for closed fracture with routine healing: Secondary | ICD-10-CM | POA: Diagnosis not present

## 2020-03-07 DIAGNOSIS — G4733 Obstructive sleep apnea (adult) (pediatric): Secondary | ICD-10-CM | POA: Diagnosis not present

## 2020-03-07 DIAGNOSIS — E876 Hypokalemia: Secondary | ICD-10-CM | POA: Diagnosis not present

## 2020-03-07 DIAGNOSIS — E039 Hypothyroidism, unspecified: Secondary | ICD-10-CM | POA: Diagnosis not present

## 2020-03-07 DIAGNOSIS — G309 Alzheimer's disease, unspecified: Secondary | ICD-10-CM | POA: Diagnosis not present

## 2020-03-07 DIAGNOSIS — K5909 Other constipation: Secondary | ICD-10-CM | POA: Diagnosis not present

## 2020-03-07 DIAGNOSIS — E559 Vitamin D deficiency, unspecified: Secondary | ICD-10-CM | POA: Diagnosis not present

## 2020-03-07 DIAGNOSIS — E785 Hyperlipidemia, unspecified: Secondary | ICD-10-CM | POA: Diagnosis not present

## 2020-03-13 ENCOUNTER — Ambulatory Visit (INDEPENDENT_AMBULATORY_CARE_PROVIDER_SITE_OTHER): Payer: Medicare Other | Admitting: Internal Medicine

## 2020-03-18 ENCOUNTER — Other Ambulatory Visit (INDEPENDENT_AMBULATORY_CARE_PROVIDER_SITE_OTHER): Payer: Self-pay | Admitting: Internal Medicine

## 2020-03-22 ENCOUNTER — Ambulatory Visit (INDEPENDENT_AMBULATORY_CARE_PROVIDER_SITE_OTHER): Payer: Medicare Other | Admitting: Internal Medicine

## 2020-04-05 ENCOUNTER — Encounter (INDEPENDENT_AMBULATORY_CARE_PROVIDER_SITE_OTHER): Payer: Self-pay | Admitting: Internal Medicine

## 2020-04-05 ENCOUNTER — Ambulatory Visit (INDEPENDENT_AMBULATORY_CARE_PROVIDER_SITE_OTHER): Payer: Medicare Other | Admitting: Internal Medicine

## 2020-04-05 ENCOUNTER — Other Ambulatory Visit: Payer: Self-pay

## 2020-04-05 VITALS — BP 134/76 | HR 72 | Temp 96.1°F | Ht 62.0 in | Wt 120.4 lb

## 2020-04-05 DIAGNOSIS — D649 Anemia, unspecified: Secondary | ICD-10-CM | POA: Diagnosis not present

## 2020-04-05 DIAGNOSIS — I10 Essential (primary) hypertension: Secondary | ICD-10-CM | POA: Diagnosis not present

## 2020-04-05 NOTE — Progress Notes (Signed)
Metrics: Intervention Frequency ACO  Documented Smoking Status Yearly  Screened one or more times in 24 months  Cessation Counseling or  Active cessation medication Past 24 months  Past 24 months   Guideline developer: UpToDate (See UpToDate for funding source) Date Released: 2014       Wellness Office Visit  Subjective:  Patient ID: Jackie Phelps, female    DOB: 1931/04/16  Age: 87 y.o. MRN: [id number]  CC: This lady comes in postoperatively.  She fractured her right hip in the third week of January this year. HPI  This was operated on and then she went to rehab and has done extremely well.  She did become anemic.  However, it does not appear that that she needed a blood transfusion. She continues on all the medications and nothing else is changed.  She feels well.  She denies any chest pain, dyspnea, palpitations or limb weakness. Past Medical History:  Diagnosis Date  . Anxiety   . Complication of anesthesia    Has increased BP after surgery.  . Constipation   . HLD (hyperlipidemia) 10/26/2018  . Hypercholesteremia   . Hypertension   . Impaired memory    due to severe sleep apnea,on CPAP now;stopped breathing 53.9 times per hour previously on study.  . Sleep apnea   . Viral warts 10/26/2018  . Vitamin D deficiency disease 10/26/2018   Past Surgical History:  Procedure Laterality Date  . CATARACT EXTRACTION, BILATERAL    . COLONOSCOPY    . COLONOSCOPY  03/05/2011   Procedure: COLONOSCOPY;  Surgeon: Rogene Houston, MD;  [facility];  Service: Endoscopy;  Laterality: N/A;  1200  . CYSTOSCOPY W/ RETROGRADES Bilateral 08/11/2016   Procedure: CYSTOSCOPY WITH BILATERAL RETROGRADE PYELOGRAM;  Surgeon: Cleon Gustin, MD;  Location: [facility];  Service: Urology;  Laterality: Bilateral;  . HIP ARTHROPLASTY Left 01/08/2014   Procedure: ARTHROPLASTY BIPOLAR HIP;  Surgeon: Marianna Payment, MD;  Location: [place];  Service: Orthopedics;  Laterality: Left;  . TOTAL HIP  ARTHROPLASTY Right 01/28/2020   Procedure: TOTAL HIP ARTHROPLASTY ANTERIOR APPROACH;  Surgeon: Rod Can, MD;  Location: [place];  Service: Orthopedics;  Laterality: Right;  . TRANSURETHRAL RESECTION OF BLADDER TUMOR N/A 08/11/2016   Procedure: TRANSURETHRAL RESECTION OF BLADDER TUMOR (TURBT);  Surgeon: Cleon Gustin, MD;  Location: [facility];  Service: Urology;  Laterality: N/A;  . TRIGGER FINGER RELEASE Bilateral    ring and middle fingers.     Family History  Problem Relation Age of Onset  . Hypertension Mother   . Healthy Daughter   . Colon cancer Neg Hx     Social History   Social History Narrative   Married 60 years.  Lives with husband.  Non-smoker.   Social History   Tobacco Use  . Smoking status: Former Smoker    Packs/day: 0.25    Years: 4.00    Pack years: 1.00    Types: Cigarettes    Quit date: 02/02/1966    Years since quitting: 54.2  . Smokeless tobacco: Never Used  Substance Use Topics  . Alcohol use: Yes    Alcohol/week: 2.0 standard drinks    Types: 2 Glasses of wine per week    Comment: Drinks wine    Current Meds  Medication Sig  . carvedilol (COREG) 6.25 MG tablet TAKE TWO TABLETS BY MOUTH TWICE DAILY (Patient taking differently: Take 12.5 mg by mouth 2 (two) times daily with a meal.)  . docusate sodium (COLACE)  100 MG capsule Take 1 capsule (100 mg total) by mouth 2 (two) times daily.  Marland Kitchen donepezil (ARICEPT) 10 MG tablet Take 10 mg by mouth daily.  Marland Kitchen escitalopram (LEXAPRO) 5 MG tablet Take 1 tablet (5 mg total) by mouth daily.  . feeding supplement (ENSURE ENLIVE / ENSURE PLUS) LIQD Take 237 mLs by mouth 2 (two) times daily between meals.  Marland Kitchen HYDROcodone-acetaminophen (NORCO/VICODIN) 5-325 MG tablet Take 1-2 tablets by mouth every 6 (six) hours as needed for moderate pain.  . memantine (NAMENDA) 10 MG tablet Take 10 mg by mouth 2 (two) times daily.   . multivitamin-lutein (OCUVITE-LUTEIN) CAPS capsule Take 1 capsule by mouth daily.  . mupirocin  ointment (BACTROBAN) 2 % Place 1 application into the nose 2 (two) times daily.  . NP THYROID 60 MG tablet TAKE 1 TABLET BY MOUTH EVERY DAY  . polyethylene glycol (MIRALAX / GLYCOLAX) 17 g packet Take 17 g by mouth daily as needed for mild constipation.  . simvastatin (ZOCOR) 20 MG tablet TAKE 1 TABLET BY MOUTH EVERY DAY (Patient taking differently: Take 20 mg by mouth daily at 12 noon.)     [place] Visit from 12/19/2019 in [facility]  PHQ-9 Total Score 0      Objective:   Today's Vitals: BP 134/76   Pulse 72   Temp (!) 96.1 F (35.6 C) (Temporal)   Ht 5\' 2"  (1.575 m)   Wt 120 lb 6.4 oz (54.6 kg)   SpO2 97%   BMI 22.02 kg/m  Vitals with BMI 04/05/2020 01/30/2020 01/30/2020  Height 5\' 2"  - -  Weight 120 lbs 6 oz - -  BMI 50.27 - -  Systolic [phone]  Diastolic 76 60 74  Pulse 72 75 75     Physical Exam  She looks systemically well.  Vital signs are excellent.  Oxygen saturation normal.  Lung fields clear.  Alert and orientated.     Assessment   1. Essential hypertension   2. Anemia, unspecified type       Tests ordered Orders Placed This Encounter  Procedures  . B12 and Folate Panel  . CBC  . COMPLETE METABOLIC PANEL WITH GFR  . Ferritin  . Iron,Total/Total Iron Binding Cap     Plan: 1. Continue with antihypertensive medications as above. 2. We will repeat her CBC and check anemia panel. 3. Further recommendations will depend on results and I will see her as scheduled in July.   No orders of the defined types were placed in this encounter.   Doree Albee, MD

## 2020-04-06 LAB — COMPLETE METABOLIC PANEL WITH GFR
AG Ratio: 2 (calc) (ref 1.0–2.5)
ALT: 8 U/L (ref 6–29)
AST: 12 U/L (ref 10–35)
Albumin: 3.9 g/dL (ref 3.6–5.1)
Alkaline phosphatase (APISO): 62 U/L (ref 37–153)
BUN: 18 mg/dL (ref 7–25)
CO2: 29 mmol/L (ref 20–32)
Calcium: 9.1 mg/dL (ref 8.6–10.4)
Chloride: 102 mmol/L (ref 98–110)
Creat: 0.79 mg/dL (ref 0.60–0.88)
GFR, Est African American: 79 mL/min/{1.73_m2} (ref >=60)
GFR, Est Non African American: 68 mL/min/{1.73_m2} (ref >=60)
Globulin: 2 g/dL (calc) (ref 1.9–3.7)
Glucose, Bld: 126 mg/dL (ref 65–139)
Potassium: 3.4 mmol/L — ABNORMAL LOW (ref 3.5–5.3)
Sodium: 140 mmol/L (ref 135–146)
Total Bilirubin: 0.3 mg/dL (ref 0.2–1.2)
Total Protein: 5.9 g/dL — ABNORMAL LOW (ref 6.1–8.1)

## 2020-04-06 LAB — CBC
HCT: 37.9 % (ref 35.0–45.0)
Hemoglobin: 12.1 g/dL (ref 11.7–15.5)
MCH: 29.2 pg (ref 27.0–33.0)
MCHC: 31.9 g/dL — ABNORMAL LOW (ref 32.0–36.0)
MCV: 91.3 fL (ref 80.0–100.0)
MPV: 11.1 fL (ref 7.5–12.5)
Platelets: 197 10*3/uL (ref 140–400)
RBC: 4.15 10*6/uL (ref 3.80–5.10)
RDW: 11.8 % (ref 11.0–15.0)
WBC: 6.2 10*3/uL (ref 3.8–10.8)

## 2020-04-06 LAB — B12 AND FOLATE PANEL
Folate: 11.8 ng/mL
Vitamin B-12: 218 pg/mL (ref 200–1100)

## 2020-04-06 LAB — IRON, TOTAL/TOTAL IRON BINDING CAP
%SAT: 16 % (calc) (ref 16–45)
Iron: 42 ug/dL — ABNORMAL LOW (ref 45–160)
TIBC: 261 mcg/dL (calc) (ref 250–450)

## 2020-04-06 LAB — FERRITIN: Ferritin: 89 ng/mL (ref 16–288)

## 2020-04-09 ENCOUNTER — Other Ambulatory Visit (INDEPENDENT_AMBULATORY_CARE_PROVIDER_SITE_OTHER): Payer: Self-pay | Admitting: Internal Medicine

## 2020-04-09 DIAGNOSIS — I10 Essential (primary) hypertension: Secondary | ICD-10-CM | POA: Diagnosis not present

## 2020-04-09 DIAGNOSIS — Z79899 Other long term (current) drug therapy: Secondary | ICD-10-CM | POA: Diagnosis not present

## 2020-04-09 DIAGNOSIS — G4733 Obstructive sleep apnea (adult) (pediatric): Secondary | ICD-10-CM | POA: Diagnosis not present

## 2020-05-14 DIAGNOSIS — N6489 Other specified disorders of breast: Secondary | ICD-10-CM | POA: Diagnosis not present

## 2020-05-14 DIAGNOSIS — R928 Other abnormal and inconclusive findings on diagnostic imaging of breast: Secondary | ICD-10-CM | POA: Diagnosis not present

## 2020-05-14 DIAGNOSIS — Z1231 Encounter for screening mammogram for malignant neoplasm of breast: Secondary | ICD-10-CM | POA: Diagnosis not present

## 2020-05-17 DIAGNOSIS — R8289 Other abnormal findings on cytological and histological examination of urine: Secondary | ICD-10-CM | POA: Diagnosis not present

## 2020-05-17 DIAGNOSIS — Z8551 Personal history of malignant neoplasm of bladder: Secondary | ICD-10-CM | POA: Diagnosis not present

## 2020-05-23 ENCOUNTER — Other Ambulatory Visit: Payer: Medicare Other | Admitting: Urology

## 2020-05-29 DIAGNOSIS — S72001D Fracture of unspecified part of neck of right femur, subsequent encounter for closed fracture with routine healing: Secondary | ICD-10-CM | POA: Diagnosis not present

## 2020-06-13 ENCOUNTER — Other Ambulatory Visit (INDEPENDENT_AMBULATORY_CARE_PROVIDER_SITE_OTHER): Payer: Self-pay | Admitting: Internal Medicine

## 2020-06-18 ENCOUNTER — Ambulatory Visit (INDEPENDENT_AMBULATORY_CARE_PROVIDER_SITE_OTHER): Payer: Medicare Other | Admitting: Internal Medicine

## 2020-07-11 ENCOUNTER — Other Ambulatory Visit (INDEPENDENT_AMBULATORY_CARE_PROVIDER_SITE_OTHER): Payer: Self-pay | Admitting: Internal Medicine

## 2020-07-16 ENCOUNTER — Ambulatory Visit (INDEPENDENT_AMBULATORY_CARE_PROVIDER_SITE_OTHER): Payer: Medicare Other | Admitting: Internal Medicine

## 2020-07-31 ENCOUNTER — Ambulatory Visit (INDEPENDENT_AMBULATORY_CARE_PROVIDER_SITE_OTHER): Payer: Medicare Other | Admitting: Internal Medicine

## 2020-08-13 ENCOUNTER — Telehealth: Payer: Medicare Other | Admitting: Physician Assistant

## 2020-08-13 ENCOUNTER — Other Ambulatory Visit (INDEPENDENT_AMBULATORY_CARE_PROVIDER_SITE_OTHER): Payer: Self-pay | Admitting: Nurse Practitioner

## 2020-08-13 DIAGNOSIS — I10 Essential (primary) hypertension: Secondary | ICD-10-CM

## 2020-08-13 DIAGNOSIS — E782 Mixed hyperlipidemia: Secondary | ICD-10-CM | POA: Diagnosis not present

## 2020-08-13 DIAGNOSIS — F411 Generalized anxiety disorder: Secondary | ICD-10-CM | POA: Diagnosis not present

## 2020-08-13 MED ORDER — ESCITALOPRAM OXALATE 5 MG PO TABS: 5.0000 mg | ORAL_TABLET | Freq: Every day | ORAL | 0 refills | Status: AC

## 2020-08-13 MED ORDER — HYDROCHLOROTHIAZIDE 12.5 MG PO TABS
12.5000 mg | ORAL_TABLET | Freq: Every day | ORAL | 0 refills | Status: DC
Start: 2020-08-13 — End: 2023-06-08

## 2020-08-13 MED ORDER — CARVEDILOL 6.25 MG PO TABS: 12.5000 mg | ORAL_TABLET | Freq: Two times a day (BID) | ORAL | 0 refills | Status: DC

## 2020-08-13 MED ORDER — SIMVASTATIN 20 MG PO TABS: 20.0000 mg | ORAL_TABLET | Freq: Every day | ORAL | 0 refills | Status: AC

## 2020-08-13 NOTE — Progress Notes (Signed)
Virtual Visit Consent   Jackie Phelps, you are scheduled for a virtual visit with a [facility] provider today.     Just as with appointments in the office, your consent must be obtained to participate.  Your consent will be active for this visit and any virtual visit you may have with one of our providers in the next 365 days.     If you have a MyChart account, a copy of this consent can be sent to you electronically.  All virtual visits are billed to your insurance company just like a traditional visit in the office.    As this is a virtual visit, video technology does not allow for your provider to perform a traditional examination.  This may limit your provider's ability to fully assess your condition.  If your provider identifies any concerns that need to be evaluated in person or the need to arrange testing (such as labs, EKG, etc.), we will make arrangements to do so.     Although advances in technology are sophisticated, we cannot ensure that it will always work on either your end or our end.  If the connection with a video visit is poor, the visit may have to be switched to a telephone visit.  With either a video or telephone visit, we are not always able to ensure that we have a secure connection.     I need to obtain your verbal consent now.   Are you willing to proceed with your visit today?    Jackie Phelps has provided verbal consent on 08/13/2020 for a virtual visit (video or telephone).   Mar Daring, PA-C   Date: 08/13/2020 1:47 PM   Virtual Visit via Video Note   I, Mar Daring, connected with  Jackie Phelps  ([id number], 1931-06-05) on 08/13/20 at 12:15 PM EDT by a video-enabled telemedicine application and verified that I am speaking with the correct person using two identifiers.  Location: Patient: Virtual Visit Location Patient: Home Provider: Virtual Visit Location Provider: Home Office   I discussed the limitations of evaluation and management  by telemedicine and the availability of in person appointments. The patient expressed understanding and agreed to proceed.    History of Present Illness: Jackie Phelps is a 87 y.o. who identifies as a female who was assigned female at birth, and is being seen today for questions about medication refills. Previous PCP, Dr. Anastasio Champion, unfortunately passed unexpectedly last week. She has appt with a new PCP, but it is not scheduled until 11/15/20. She did have a f/u appt with Jeralyn Ruths, NP within Dr. Lanice Shirts office on 08/27/20, but that was canceled as they are only going to see acute visits for the time being. She is requesting refills for medications prescribed by Dr. Anastasio Champion to hold her over until she can establish with her new PCP.Marland Kitchen  HPI: HPI  Problems:  Patient Active Problem List   Diagnosis Date Noted   Other fracture of right femur, initial encounter for closed fracture ([place]) 01/27/2020   Malignant neoplasm of urinary bladder ([place]) 05/25/2019   Vitamin D deficiency disease 10/26/2018   Viral warts 10/26/2018   HLD (hyperlipidemia) 10/26/2018   Iron deficiency anemia 01/22/2014   Anxiety state 01/16/2014   Cough 01/16/2014   Left displaced femoral neck fracture ([place]) 01/08/2014   Chronic constipation 02/03/2011   Hypertension 02/03/2011    Allergies:  Allergies  Allergen Reactions   Penicillins Hives and Rash    Has  patient had a PCN reaction causing immediate rash, facial/tongue/throat swelling, SOB or lightheadedness with hypotension: unknown Has patient had a PCN reaction causing severe rash involving mucus membranes or skin necrosis: unknown Has patient had a PCN reaction that required hospitalization: unknown Has patient had a PCN reaction occurring within the last 10 years: unknown  Patient tolerated Ancef without any complications.   Medications:  Current Outpatient Medications:    carvedilol (COREG) 6.25 MG tablet, Take 2 tablets (12.5 mg total) by mouth 2 (two) times  daily., Disp: 360 tablet, Rfl: 0   donepezil (ARICEPT) 10 MG tablet, Take 10 mg by mouth daily., Disp: , Rfl: 2   escitalopram (LEXAPRO) 5 MG tablet, Take 1 tablet (5 mg total) by mouth daily., Disp: 90 tablet, Rfl: 0   feeding supplement (ENSURE ENLIVE / ENSURE PLUS) LIQD, Take 237 mLs by mouth 2 (two) times daily between meals., Disp: 237 mL, Rfl: 12   hydrochlorothiazide (HYDRODIURIL) 12.5 MG tablet, Take 1 tablet (12.5 mg total) by mouth daily., Disp: 90 tablet, Rfl: 0   memantine (NAMENDA) 10 MG tablet, Take 10 mg by mouth 2 (two) times daily. , Disp: , Rfl: 0   multivitamin-lutein (OCUVITE-LUTEIN) CAPS capsule, Take 1 capsule by mouth daily., Disp: , Rfl: 12   simvastatin (ZOCOR) 20 MG tablet, Take 1 tablet (20 mg total) by mouth daily., Disp: 90 tablet, Rfl: 0  Observations/Objective: Patient is well-developed, well-nourished in no acute distress.  Resting comfortably at home.  Head is normocephalic, atraumatic.  No labored breathing.  Speech is clear and coherent with logical content.  Patient is alert and oriented at baseline.    Assessment and Plan: 1. Primary hypertension - carvedilol (COREG) 6.25 MG tablet; Take 2 tablets (12.5 mg total) by mouth 2 (two) times daily.  Dispense: 360 tablet; Refill: 0 - hydrochlorothiazide (HYDRODIURIL) 12.5 MG tablet; Take 1 tablet (12.5 mg total) by mouth daily.  Dispense: 90 tablet; Refill: 0  2. Anxiety state - escitalopram (LEXAPRO) 5 MG tablet; Take 1 tablet (5 mg total) by mouth daily.  Dispense: 90 tablet; Refill: 0  3. Mixed hyperlipidemia - simvastatin (ZOCOR) 20 MG tablet; Take 1 tablet (20 mg total) by mouth daily.  Dispense: 90 tablet; Refill: 0  - Was approved by Dr. Sabra Heck to refill patient's prescriptions for 3 months, instead of customary one month, due to circumstances regarding refills. - Above medications were refilled  Follow Up Instructions: I discussed the assessment and treatment plan with the patient. The patient  was provided an opportunity to ask questions and all were answered. The patient agreed with the plan and demonstrated an understanding of the instructions.  A copy of instructions were sent to the patient via MyChart.  The patient was advised to call back or seek an in-person evaluation if the symptoms worsen or if the condition fails to improve as anticipated.  Time:  I spent 14 minutes with the patient via telehealth technology discussing the above problems/concerns.    Mar Daring, PA-C

## 2020-08-13 NOTE — Patient Instructions (Signed)
Jackie Phelps, thank you for joining Mar Daring, PA-C for today's virtual visit.  While this provider is not your primary care provider (PCP), if your PCP is located in our provider database this encounter information will be shared with them immediately following your visit.  Consent: (Patient) Jackie Phelps provided verbal consent for this virtual visit at the beginning of the encounter.  Current Medications:  Current Outpatient Medications:    carvedilol (COREG) 6.25 MG tablet, Take 2 tablets (12.5 mg total) by mouth 2 (two) times daily., Disp: 360 tablet, Rfl: 0   donepezil (ARICEPT) 10 MG tablet, Take 10 mg by mouth daily., Disp: , Rfl: 2   escitalopram (LEXAPRO) 5 MG tablet, Take 1 tablet (5 mg total) by mouth daily., Disp: 90 tablet, Rfl: 0   feeding supplement (ENSURE ENLIVE / ENSURE PLUS) LIQD, Take 237 mLs by mouth 2 (two) times daily between meals., Disp: 237 mL, Rfl: 12   hydrochlorothiazide (HYDRODIURIL) 12.5 MG tablet, Take 1 tablet (12.5 mg total) by mouth daily., Disp: 90 tablet, Rfl: 0   memantine (NAMENDA) 10 MG tablet, Take 10 mg by mouth 2 (two) times daily. , Disp: , Rfl: 0   multivitamin-lutein (OCUVITE-LUTEIN) CAPS capsule, Take 1 capsule by mouth daily., Disp: , Rfl: 12   simvastatin (ZOCOR) 20 MG tablet, Take 1 tablet (20 mg total) by mouth daily., Disp: 90 tablet, Rfl: 0   Medications ordered in this encounter:  Meds ordered this encounter  Medications   carvedilol (COREG) 6.25 MG tablet    Sig: Take 2 tablets (12.5 mg total) by mouth 2 (two) times daily.    Dispense:  360 tablet    Refill:  0    This prescription was filled on 05/24/2020. Any refills authorized will be placed on file.    Order Specific Question:   Supervising Provider    Answer:   Noemi Chapel [[id number]]   escitalopram (LEXAPRO) 5 MG tablet    Sig: Take 1 tablet (5 mg total) by mouth daily.    Dispense:  90 tablet    Refill:  0    This prescription was filled on 05/24/2020. Any  refills authorized will be placed on file.    Order Specific Question:   Supervising Provider    Answer:   Noemi Chapel [[id number]]   simvastatin (ZOCOR) 20 MG tablet    Sig: Take 1 tablet (20 mg total) by mouth daily.    Dispense:  90 tablet    Refill:  0    This prescription was filled on 05/24/2020. Any refills authorized will be placed on file.    Order Specific Question:   Supervising Provider    Answer:   Noemi Chapel [[id number]]   hydrochlorothiazide (HYDRODIURIL) 12.5 MG tablet    Sig: Take 1 tablet (12.5 mg total) by mouth daily.    Dispense:  90 tablet    Refill:  0    This prescription was filled on 03/20/2020. Any refills authorized will be placed on file.    Order Specific Question:   Supervising Provider    Answer:   Noemi Chapel [[id number]]     *If you need refills on other medications prior to your next appointment, please contact your pharmacy*  Follow-Up: Call back or seek an in-person evaluation if the symptoms worsen or if the condition fails to improve as anticipated.   If you have been instructed to have an in-person evaluation today at a local Urgent Care facility, please use the link  below. It will take you to a list of all of our available [facility], including address, phone number and hours of operation. Please do not delay care.  [facility]  If you or a family member do not have a primary care provider, use the link below to schedule a visit and establish care. When you choose a [facility] primary care physician or advanced practice provider, you gain a long-term partner in health. Find a Primary Care Provider  Learn more about [facility] in-office and virtual care options: [facility]

## 2020-08-27 ENCOUNTER — Ambulatory Visit (INDEPENDENT_AMBULATORY_CARE_PROVIDER_SITE_OTHER): Payer: Medicare Other | Admitting: Internal Medicine

## 2021-01-08 ENCOUNTER — Other Ambulatory Visit: Payer: Self-pay | Admitting: Physician Assistant

## 2021-01-08 DIAGNOSIS — F411 Generalized anxiety disorder: Secondary | ICD-10-CM

## 2021-01-08 DIAGNOSIS — I10 Essential (primary) hypertension: Secondary | ICD-10-CM

## 2021-01-09 ENCOUNTER — Other Ambulatory Visit: Payer: Self-pay | Admitting: Physician Assistant

## 2021-01-09 DIAGNOSIS — F411 Generalized anxiety disorder: Secondary | ICD-10-CM

## 2021-05-11 ENCOUNTER — Emergency Department (HOSPITAL_COMMUNITY)
Admission: EM | Admit: 2021-05-11 | Discharge: 2021-05-11 | Disposition: A | Discharge status: Home / Self Care | Payer: Medicare Other | Attending: Emergency Medicine | Admitting: Emergency Medicine

## 2021-05-11 ENCOUNTER — Encounter (HOSPITAL_COMMUNITY): Payer: Self-pay

## 2021-05-11 ENCOUNTER — Other Ambulatory Visit: Payer: Self-pay

## 2021-05-11 ENCOUNTER — Emergency Department (HOSPITAL_COMMUNITY): Payer: Medicare Other

## 2021-05-11 DIAGNOSIS — Z79899 Other long term (current) drug therapy: Secondary | ICD-10-CM | POA: Insufficient documentation

## 2021-05-11 DIAGNOSIS — L03114 Cellulitis of left upper limb: Secondary | ICD-10-CM | POA: Insufficient documentation

## 2021-05-11 DIAGNOSIS — F0390 Unspecified dementia without behavioral disturbance: Secondary | ICD-10-CM | POA: Diagnosis not present

## 2021-05-11 DIAGNOSIS — R21 Rash and other nonspecific skin eruption: Secondary | ICD-10-CM | POA: Diagnosis present

## 2021-05-11 DIAGNOSIS — I10 Essential (primary) hypertension: Secondary | ICD-10-CM | POA: Insufficient documentation

## 2021-05-11 HISTORY — DX: Unspecified dementia, unspecified severity, without behavioral disturbance, psychotic disturbance, mood disturbance, and anxiety: F03.90

## 2021-05-11 LAB — URINALYSIS, ROUTINE W REFLEX MICROSCOPIC
Bilirubin Urine: NEGATIVE
Glucose, UA: NEGATIVE mg/dL
Ketones, ur: NEGATIVE mg/dL
Nitrite: NEGATIVE
Protein, ur: NEGATIVE mg/dL
Specific Gravity, Urine: 1.02 (ref 1.005–1.030)
pH: 6 (ref 5.0–8.0)

## 2021-05-11 LAB — URINALYSIS, MICROSCOPIC (REFLEX)

## 2021-05-11 LAB — BASIC METABOLIC PANEL
Anion gap: 8 (ref 5–15)
BUN: 17 mg/dL (ref 8–23)
CO2: 28 mmol/L (ref 22–32)
Calcium: 8.9 mg/dL (ref 8.9–10.3)
Chloride: 101 mmol/L (ref 98–111)
Creatinine, Ser: 0.9 mg/dL (ref 0.44–1.00)
GFR, Estimated: >60 mL/min (ref >60)
Glucose, Bld: 107 mg/dL — ABNORMAL HIGH (ref 70–99)
Potassium: 3.1 mmol/L — ABNORMAL LOW (ref 3.5–5.1)
Sodium: 137 mmol/L (ref 135–145)

## 2021-05-11 LAB — CBC WITH DIFFERENTIAL/PLATELET
Abs Immature Granulocytes: 0.03 10*3/uL (ref 0.00–0.07)
Basophils Absolute: 0 10*3/uL (ref 0.0–0.1)
Basophils Relative: 1 %
Eosinophils Absolute: 0 10*3/uL (ref 0.0–0.5)
Eosinophils Relative: 0 %
HCT: 36.1 % (ref 36.0–46.0)
Hemoglobin: 12.1 g/dL (ref 12.0–15.0)
Immature Granulocytes: 0 %
Lymphocytes Relative: 14 %
Lymphs Abs: 1.2 10*3/uL (ref 0.7–4.0)
MCH: 30 pg (ref 26.0–34.0)
MCHC: 33.5 g/dL (ref 30.0–36.0)
MCV: 89.4 fL (ref 80.0–100.0)
Monocytes Absolute: 1 10*3/uL (ref 0.1–1.0)
Monocytes Relative: 12 %
Neutro Abs: 6.4 10*3/uL (ref 1.7–7.7)
Neutrophils Relative %: 73 %
Platelets: 187 10*3/uL (ref 150–400)
RBC: 4.04 MIL/uL (ref 3.87–5.11)
RDW: 12.5 % (ref 11.5–15.5)
WBC: 8.7 10*3/uL (ref 4.0–10.5)
nRBC: 0 % (ref 0.0–0.2)

## 2021-05-11 MED ORDER — DOXYCYCLINE HYCLATE 100 MG PO CAPS: 100.0000 mg | ORAL_CAPSULE | Freq: Two times a day (BID) | ORAL | 0 refills | Status: AC

## 2021-05-11 MED ORDER — OXYCODONE-ACETAMINOPHEN 5-325 MG PO TABS
1.0000 | ORAL_TABLET | Freq: Once | ORAL | Status: AC
  Administered 2021-05-11: 1 via ORAL
  Filled 2021-05-11: qty 1

## 2021-05-11 MED ORDER — DOXYCYCLINE HYCLATE 100 MG PO TABS
100.0000 mg | ORAL_TABLET | Freq: Once | ORAL | Status: AC
  Administered 2021-05-11: 100 mg via ORAL
  Filled 2021-05-11: qty 1

## 2021-05-11 NOTE — ED Notes (Signed)
ED PA at BS 

## 2021-05-11 NOTE — ED Notes (Signed)
Patient attempted to e sign the discharge form. Was unable to obtain due to faulty equipment.  ?

## 2021-05-11 NOTE — ED Notes (Addendum)
Lab/PBT at [facility]. CT will return. ?

## 2021-05-11 NOTE — ED Triage Notes (Signed)
Pt has dementia and her left arm is warm, swollen , red and decreased strength in left hand.  ?

## 2021-05-11 NOTE — ED Provider Notes (Signed)
?[name] ?Provider Note ? ? ?CSN: [id number] ?Arrival date & time: 05/11/21  1526 ? ?  ? ?History ? ?Chief Complaint  ?Patient presents with  ? Rash  ? ? ?Jackie Phelps is a 88 y.o. female. ? ?HPI ? ?Patient with medical history including hypertension, dementia, presents with complaints of a rash on her left arm.  Patient's daughter is at bedside, she endorses that today she noticed that the patient had a rash on her left arm, she was also weaker in that arm, states that she is having difficulty moving her hand, she denies any trauma to the area, denies any recent bug bites or scratches.  She never had this in the past, she has no systemic rash no fevers or chills URI-like symptoms stomach pains nausea vomiting diarrhea.  She also notes that she seems slightly lethargic, a bit more confused than baseline, she does have dementia and lives with her states he just seems a bit off. ? ?I reviewed patient's chart managed by internal medicine, ? ?Home Medications ?Prior to Admission medications   ?Medication Sig Start Date End Date Taking? Authorizing Provider  ?doxycycline (VIBRAMYCIN) 100 MG capsule Take 1 capsule (100 mg total) by mouth 2 (two) times daily for 7 days. 05/11/21 05/18/21 Yes Marcello Fennel, PA-C  ?carvedilol (COREG) 6.25 MG tablet Take 2 tablets (12.5 mg total) by mouth 2 (two) times daily. 08/13/20   Mar Daring, PA-C  ?donepezil (ARICEPT) 10 MG tablet Take 10 mg by mouth daily. 06/18/16   [provider]  ?escitalopram (LEXAPRO) 5 MG tablet Take 1 tablet (5 mg total) by mouth daily. 08/13/20   Mar Daring, PA-C  ?feeding supplement (ENSURE ENLIVE / ENSURE PLUS) LIQD Take 237 mLs by mouth 2 (two) times daily between meals. 01/31/20   Cristal Deer, MD  ?hydrochlorothiazide (HYDRODIURIL) 12.5 MG tablet Take 1 tablet (12.5 mg total) by mouth daily. 08/13/20 11/11/20  Mar Daring, PA-C  ?memantine (NAMENDA) 10 MG tablet Take 10 mg by mouth 2 (two)  times daily.  06/18/16   [provider]  ?multivitamin-lutein (OCUVITE-LUTEIN) CAPS capsule Take 1 capsule by mouth daily. 07/18/16   [provider]  ?simvastatin (ZOCOR) 20 MG tablet Take 1 tablet (20 mg total) by mouth daily. 08/13/20   Mar Daring, PA-C  ?   ? ?Allergies    ?Penicillins   ? ?Review of Systems   ?Review of Systems  ?Constitutional:  Negative for chills and fever.  ?Respiratory:  Negative for shortness of breath.   ?Cardiovascular:  Negative for chest pain.  ?Gastrointestinal:  Negative for abdominal pain.  ?Skin:  Positive for rash.  ?Neurological:  Negative for headaches.  ? ?Physical Exam ?Updated Vital Signs ?BP 115/62 (BP Location: Right Arm)   Pulse 80   Temp 98.3 ?F (36.8 ?C) (Oral)   Resp 16   Ht '5\' 4"'$  (1.626 m)   Wt 55.8 kg   SpO2 99%   BMI 21.11 kg/m?  ?Physical Exam ?Vitals and nursing note reviewed.  ?Constitutional:   ?   General: She is not in acute distress. ?   Appearance: She is not ill-appearing.  ?HENT:  ?   Head: Normocephalic and atraumatic.  ?   Comments: No deformity of the head present no raccoon eyes or battle sign noted. ?   Nose: No congestion.  ?   Mouth/Throat:  ?   Mouth: Mucous membranes are moist.  ?   Pharynx: Oropharynx is clear. No  oropharyngeal exudate or posterior oropharyngeal erythema.  ?   Comments: No trismus no torticollis no oral trauma ?Eyes:  ?   Extraocular Movements: Extraocular movements intact.  ?   Conjunctiva/sclera: Conjunctivae normal.  ?Cardiovascular:  ?   Rate and Rhythm: Normal rate and regular rhythm.  ?   Pulses: Normal pulses.  ?   Heart sounds: No murmur heard. ?  No friction rub. No gallop.  ?Pulmonary:  ?   Effort: No respiratory distress.  ?   Breath sounds: No wheezing, rhonchi or rales.  ?Abdominal:  ?   Palpations: Abdomen is soft.  ?   Tenderness: There is no abdominal tenderness. There is no right CVA tenderness or left CVA tenderness.  ?Musculoskeletal:  ?   Cervical back: No tenderness.  ?    Comments: Spine palpated nontender to palpation no step-off in form is noted.  Moving all 4 extremities out difficulty.  ?Skin: ?   General: Skin is warm and dry.  ?   Comments: Patient has an erythematous macule like rash on the left distal forearm, warm to the touch, blanches with pressure, no scaling of the skin, no obvious breakage in the skin, no drainage or discharge no flexion induration noted.  ?Neurological:  ?   Mental Status: She is alert.  ?   Cranial Nerves: Cranial nerves 2-12 are intact. No cranial nerve deficit.  ?   Sensory: Sensation is intact.  ?   Motor: Weakness present.  ?   Coordination: Romberg sign negative. Finger-Nose-Finger Test normal.  ?   Gait: Gait is intact.  ?   Comments: Cranial nerves II through XII grossly intact no difficult word finding, following two-step commands, patient noted weakness in her left hand distal to the rash, but had full strength at her elbow and shoulder, she also has slight weakness in her left hip but has full strength at her knee ankle and toes.  Gait fully intact  ?Psychiatric:     ?   Mood and Affect: Mood normal.  ? ? ?ED Results / Procedures / Treatments   ?Labs ?(all labs ordered are listed, but only abnormal results are displayed) ?Labs Reviewed  ?BASIC METABOLIC PANEL - Abnormal; Notable for the following components:  ?    Result Value  ? Potassium 3.1 (*)   ? Glucose, Bld 107 (*)   ? All other components within normal limits  ?URINALYSIS, ROUTINE W REFLEX MICROSCOPIC - Abnormal; Notable for the following components:  ? Hgb urine dipstick LARGE (*)   ? Leukocytes,Ua SMALL (*)   ? All other components within normal limits  ?URINALYSIS, MICROSCOPIC (REFLEX) - Abnormal; Notable for the following components:  ? Bacteria, UA MANY (*)   ? All other components within normal limits  ?URINE CULTURE  ?CBC WITH DIFFERENTIAL/PLATELET  ? ? ?EKG ?None ? ?Radiology ?CT Head Wo Contrast ? ?Result Date: 05/11/2021 ?CLINICAL DATA:  Mental status changes of unknown  cause. Dementia. Left arm swelling and warmth. EXAM: CT HEAD WITHOUT CONTRAST TECHNIQUE: Contiguous axial images were obtained from the base of the skull through the vertex without intravenous contrast. RADIATION DOSE REDUCTION: This exam was performed according to the departmental dose-optimization program which includes automated exposure control, adjustment of the mA and/or kV according to patient size and/or use of iterative reconstruction technique. COMPARISON:  MRI 10/18/2014 FINDINGS: Brain: Generalized atrophy. No focal abnormality seen affecting the brainstem or cerebellum. Cerebral hemispheres show mild chronic small-vessel ischemic change of the white matter and an old small  left frontal subcortical infarction. No acute infarction is seen. No mass lesion, hemorrhage, hydrocephalus or extra-axial collection. Vascular: There is atherosclerotic calcification of the major vessels at the base of the brain. Skull: Negative Sinuses/Orbits: Clear/normal Other: None IMPRESSION: No acute CT finding. Generalized atrophy. Mild chronic small-vessel ischemic changes of the white matter. Old left frontal subcortical infarction. Electronically Signed   By: Nelson Chimes M.D.   On: 05/11/2021 17:24   ? ?Procedures ?Procedures  ? ? ?Medications Ordered in ED ?Medications  ?doxycycline (VIBRA-TABS) tablet 100 mg (has no administration in time range)  ?oxyCODONE-acetaminophen (PERCOCET/ROXICET) 5-325 MG per tablet 1 tablet (1 tablet Oral Given 05/11/21 1646)  ? ? ?ED Course/ Medical Decision Making/ A&P ?  ?                        ?Medical Decision Making ?Amount and/or Complexity of Data Reviewed ?Labs: ordered. ?Radiology: ordered. ? ?Risk ?Prescription drug management. ? ? ?This patient presents to the ED for concern of rash, lethargic, this involves an extensive number of treatment options, and is a complaint that carries with it a high risk of complications and morbidity.  The differential diagnosis includes sepsis,  cellulitis, CVA, metabolic derailments ? ? ? ?Additional history obtained: ? ?Additional history obtained from daughter at bedside ?External records from outside source obtained and reviewed including previous lab work, PCP no

## 2021-05-11 NOTE — Discharge Instructions (Signed)
Lab work imaging was reassuring.  I suspect you likely have a small skin infection of your left arm, I have started you on antibiotics please take as prescribed. ? ?Please follow-up with your PCP for reevaluation. ? ?Please come back if you notice your symptoms are worsening i.e. worsening redness, streaking of the arm, worsening pain becoming more altered as she will need further evaluation. ?

## 2021-05-11 NOTE — ED Notes (Addendum)
EDPA into room, at BS.  

## 2021-05-11 NOTE — ED Notes (Addendum)
L anterior FA rash c/w possible cellulitis, 10cm x 14cm, denies itching, TTP, mild pain.  CMS, ROM, skin intact. Onset today. Endorses some general weakness/ not feeling well. Denies itching, fever, NVD, HA, dizziness. No known contact/ bite/ sting. Daughter at [facility]. TDAP on 5/1, not sure which arm. ?

## 2021-05-11 NOTE — ED Notes (Signed)
Unable to give urine sample at this time. Drinking water. Taken to CT via w/c.  ?

## 2021-05-14 LAB — URINE CULTURE: Culture: >=100000 — AB

## 2021-05-15 ENCOUNTER — Telehealth: Payer: Self-pay

## 2021-05-15 ENCOUNTER — Telehealth: Payer: Self-pay | Admitting: Emergency Medicine

## 2021-05-15 NOTE — Progress Notes (Signed)
ED Antimicrobial Stewardship Positive Culture Follow Up  ? ?Jackie Phelps is an 88 y.o. female who presented to [facility] on 05/11/2021 with a chief complaint of  ?Chief Complaint  ?Patient presents with  ? Rash  ? ? ?Recent Results (from the past 720 hour(s))  ?Urine Culture     Status: Abnormal  ? Collection Time: 05/11/21  6:45 PM  ? Specimen: Urine, Clean Catch  ?Result Value Ref Range Status  ? Specimen Description   Final  ?  URINE, CLEAN CATCH ?Performed at [facility], [address], [place], [place] [address] ?  ? Special Requests   Final  ?  NONE ?Performed at [facility], [address], [place], [place] [address] ?  ? Culture >=100,000 COLONIES/mL ESCHERICHIA COLI (A)  Final  ? Report Status 05/14/2021 FINAL  Final  ? Organism ID, Bacteria ESCHERICHIA COLI (A)  Final  ?    Susceptibility  ? Escherichia coli - MIC*  ?  AMPICILLIN 8 SENSITIVE Sensitive   ?  CEFAZOLIN <=4 SENSITIVE Sensitive   ?  CEFEPIME <=0.12 SENSITIVE Sensitive   ?  CEFTRIAXONE <=0.25 SENSITIVE Sensitive   ?  CIPROFLOXACIN <=0.25 SENSITIVE Sensitive   ?  GENTAMICIN <=1 SENSITIVE Sensitive   ?  IMIPENEM <=0.25 SENSITIVE Sensitive   ?  NITROFURANTOIN <=16 SENSITIVE Sensitive   ?  TRIMETH/SULFA <=20 SENSITIVE Sensitive   ?  AMPICILLIN/SULBACTAM 4 SENSITIVE Sensitive   ?  PIP/TAZO <=4 SENSITIVE Sensitive   ?  * >=100,000 COLONIES/mL ESCHERICHIA COLI  ? ? ?'[]'$  Treated with doxycyline (for SSTI/cellulits), organism resistant to prescribed antimicrobial ?'[x]'$  Patient discharged originally without antimicrobial agent for UTI and treatment may now be indicated ? ?Call patient (and/or patient's caregiver) and assess if having urinary symptoms (dysuria, frequency, pain). If symptomatic, add on new antibiotic ?If symptomatic, new antibiotic prescription: cephalexin 500 mg PO BID x 7 days ?Continue doxycyline 100 mg PO BID for cellulitis  ? ?ED Provider: Elnora Morrison, MD ? ?Thank you for involving pharmacy in this patient's care. ? ?Elita Quick, PharmD ?PGY1 Ambulatory Care Pharmacy Resident ?05/15/2021 9:39 AM ?Clinical Pharmacist ?Monday - Friday phone -  [phone] ?Saturday - Sunday phone - [phone] ? ?

## 2022-05-21 ENCOUNTER — Other Ambulatory Visit (HOSPITAL_COMMUNITY): Payer: Self-pay | Admitting: Family Medicine

## 2022-05-21 DIAGNOSIS — M8580 Other specified disorders of bone density and structure, unspecified site: Secondary | ICD-10-CM

## 2022-05-30 ENCOUNTER — Emergency Department (HOSPITAL_COMMUNITY): Payer: Medicare Other

## 2022-05-30 ENCOUNTER — Emergency Department (HOSPITAL_COMMUNITY)
Admission: EM | Admit: 2022-05-30 | Discharge: 2022-05-30 | Disposition: A | Discharge status: Home / Self Care | Payer: Medicare Other | Attending: Emergency Medicine | Admitting: Emergency Medicine

## 2022-05-30 ENCOUNTER — Other Ambulatory Visit: Payer: Self-pay

## 2022-05-30 DIAGNOSIS — Z7982 Long term (current) use of aspirin: Secondary | ICD-10-CM | POA: Insufficient documentation

## 2022-05-30 DIAGNOSIS — Z8551 Personal history of malignant neoplasm of bladder: Secondary | ICD-10-CM | POA: Insufficient documentation

## 2022-05-30 DIAGNOSIS — E860 Dehydration: Secondary | ICD-10-CM | POA: Diagnosis not present

## 2022-05-30 DIAGNOSIS — R55 Syncope and collapse: Secondary | ICD-10-CM | POA: Diagnosis present

## 2022-05-30 DIAGNOSIS — Z79899 Other long term (current) drug therapy: Secondary | ICD-10-CM | POA: Insufficient documentation

## 2022-05-30 DIAGNOSIS — I10 Essential (primary) hypertension: Secondary | ICD-10-CM | POA: Diagnosis not present

## 2022-05-30 DIAGNOSIS — F0390 Unspecified dementia without behavioral disturbance: Secondary | ICD-10-CM | POA: Insufficient documentation

## 2022-05-30 DIAGNOSIS — I951 Orthostatic hypotension: Secondary | ICD-10-CM

## 2022-05-30 LAB — BASIC METABOLIC PANEL
Anion gap: 10 (ref 5–15)
BUN: 17 mg/dL (ref 8–23)
CO2: 24 mmol/L (ref 22–32)
Calcium: 8.9 mg/dL (ref 8.9–10.3)
Chloride: 103 mmol/L (ref 98–111)
Creatinine, Ser: 1.06 mg/dL — ABNORMAL HIGH (ref 0.44–1.00)
GFR, Estimated: 51 mL/min — ABNORMAL LOW (ref >60)
Glucose, Bld: 101 mg/dL — ABNORMAL HIGH (ref 70–99)
Potassium: 3.4 mmol/L — ABNORMAL LOW (ref 3.5–5.1)
Sodium: 137 mmol/L (ref 135–145)

## 2022-05-30 LAB — URINALYSIS, ROUTINE W REFLEX MICROSCOPIC
Bacteria, UA: NONE SEEN
Bilirubin Urine: NEGATIVE
Glucose, UA: NEGATIVE mg/dL
Ketones, ur: NEGATIVE mg/dL
Leukocytes,Ua: NEGATIVE
Nitrite: NEGATIVE
Protein, ur: NEGATIVE mg/dL
Specific Gravity, Urine: 1.013 (ref 1.005–1.030)
pH: 6 (ref 5.0–8.0)

## 2022-05-30 LAB — CBC WITH DIFFERENTIAL/PLATELET
Abs Immature Granulocytes: 0.03 10*3/uL (ref 0.00–0.07)
Basophils Absolute: 0.1 10*3/uL (ref 0.0–0.1)
Basophils Relative: 1 %
Eosinophils Absolute: 0.1 10*3/uL (ref 0.0–0.5)
Eosinophils Relative: 2 %
HCT: 37.1 % (ref 36.0–46.0)
Hemoglobin: 12.3 g/dL (ref 12.0–15.0)
Immature Granulocytes: 1 %
Lymphocytes Relative: 21 %
Lymphs Abs: 1.3 10*3/uL (ref 0.7–4.0)
MCH: 29.9 pg (ref 26.0–34.0)
MCHC: 33.2 g/dL (ref 30.0–36.0)
MCV: 90.3 fL (ref 80.0–100.0)
Monocytes Absolute: 0.7 10*3/uL (ref 0.1–1.0)
Monocytes Relative: 11 %
Neutro Abs: 4 10*3/uL (ref 1.7–7.7)
Neutrophils Relative %: 64 %
Platelets: 173 10*3/uL (ref 150–400)
RBC: 4.11 MIL/uL (ref 3.87–5.11)
RDW: 12.4 % (ref 11.5–15.5)
WBC: 6.2 10*3/uL (ref 4.0–10.5)
nRBC: 0 % (ref 0.0–0.2)

## 2022-05-30 LAB — TSH: TSH: 1.106 u[IU]/mL (ref 0.350–4.500)

## 2022-05-30 LAB — MAGNESIUM: Magnesium: 2 mg/dL (ref 1.7–2.4)

## 2022-05-30 LAB — TROPONIN I (HIGH SENSITIVITY)
Troponin I (High Sensitivity): 4 ng/L (ref <18)
Troponin I (High Sensitivity): 4 ng/L (ref <18)

## 2022-05-30 MED ORDER — LACTATED RINGERS IV BOLUS
500.0000 mL | Freq: Once | INTRAVENOUS | Status: AC
  Administered 2022-05-30: 500 mL via INTRAVENOUS

## 2022-05-30 MED ORDER — HYDRALAZINE HCL 25 MG PO TABS
25.0000 mg | ORAL_TABLET | ORAL | Status: AC
  Administered 2022-05-30: 25 mg via ORAL
  Filled 2022-05-30: qty 1

## 2022-05-30 NOTE — ED Notes (Signed)
Daughter said Pt has been treated for UTI recently and a tick removed recently. Pt didn't finish her complete antibiotic treatment recently. Daughter says Pt hasn't seemed quite normal recently.

## 2022-05-30 NOTE — ED Provider Notes (Signed)
[place] EMERGENCY DEPARTMENT AT [facility] Provider Note   CSN: [id number] Arrival date & time: 05/30/22  1407     History {Add pertinent medical, surgical, social history, OB history to HPI:1} Chief Complaint  Patient presents with   Loss of Consciousness    Jackie Phelps is a 89 y.o. female.  89 year old female with a history of dementia, bladder cancer in remission, hypertension, and hyperlipidemia who presents to the emergency department with syncopal event.  Patient's daughter provides most of the history and states that she lives with her mother and over the past 3 to 4 weeks has been less interactive than usual and eating less.  Has been sleeping and staying in her pajamas until noon which is abnormal for her.  Is her primary doctor and was diagnosed with a urinary tract infection on 05/20/2022 and has been taking Macrobid for it since then.  Primary doctor was concerned about possible worsening dementia or depression.  Says that today her mother was walking and started to stare off into space and then lose tone of her body.  She was able to catch her mother and so she did not hit the ground hard or have a head strike.  Did fully lose consciousness.  No bowel or bladder incontinence, tongue bite, or seizure-like activity.  Did have a tick on her 3 weeks ago but it was removed and was not engorged and did not have any bull's-eye rash.       Home Medications Prior to Admission medications   Medication Sig Start Date End Date Taking? Authorizing Provider  carvedilol (COREG) 6.25 MG tablet Take 2 tablets (12.5 mg total) by mouth 2 (two) times daily. 08/13/20   Margaretann Loveless, PA-C  donepezil (ARICEPT) 10 MG tablet Take 10 mg by mouth daily. 06/18/16   [provider]  escitalopram (LEXAPRO) 5 MG tablet Take 1 tablet (5 mg total) by mouth daily. 08/13/20   Margaretann Loveless, PA-C  feeding supplement (ENSURE ENLIVE / ENSURE PLUS) LIQD Take 237 mLs by mouth 2  (two) times daily between meals. 01/31/20   Myrtie Neither, MD  hydrochlorothiazide (HYDRODIURIL) 12.5 MG tablet Take 1 tablet (12.5 mg total) by mouth daily. 08/13/20 11/11/20  Margaretann Loveless, PA-C  memantine (NAMENDA) 10 MG tablet Take 10 mg by mouth 2 (two) times daily.  06/18/16   [provider]  multivitamin-lutein (OCUVITE-LUTEIN) CAPS capsule Take 1 capsule by mouth daily. 07/18/16   [provider]  simvastatin (ZOCOR) 20 MG tablet Take 1 tablet (20 mg total) by mouth daily. 08/13/20   Margaretann Loveless, PA-C      Allergies    Penicillins    Review of Systems   Review of Systems  Physical Exam Updated Vital Signs BP (!) 130/54   Pulse 67   Temp 98.4 F (36.9 C) (Oral)   Resp 15   Ht 5\' 4"  (1.626 m)   Wt 55.8 kg   SpO2 97%   BMI 21.12 kg/m  Physical Exam Vitals and nursing note reviewed.  Constitutional:      General: She is not in acute distress.    Appearance: She is well-developed.     Comments: Alert and oriented x 2 and very well-appearing.  This is her baseline per patient's daughter  HENT:     Head: Normocephalic and atraumatic.     Right Ear: External ear normal.     Left Ear: External ear normal.     Nose: Nose  normal.  Eyes:     Extraocular Movements: Extraocular movements intact.     Conjunctiva/sclera: Conjunctivae normal.     Pupils: Pupils are equal, round, and reactive to light.  Cardiovascular:     Rate and Rhythm: Normal rate and regular rhythm.     Heart sounds: No murmur heard. Pulmonary:     Effort: Pulmonary effort is normal. No respiratory distress.     Breath sounds: Normal breath sounds.  Abdominal:     General: Abdomen is flat. There is no distension.     Palpations: Abdomen is soft. There is no mass.     Tenderness: There is no abdominal tenderness. There is no guarding.  Musculoskeletal:     Cervical back: Normal range of motion and neck supple.     Right lower leg: No edema.     Left lower leg: No edema.   Skin:    General: Skin is warm and dry.  Neurological:     Mental Status: She is alert. Mental status is at baseline.     Cranial Nerves: No cranial nerve deficit.     Sensory: No sensory deficit.     Motor: No weakness.  Psychiatric:        Mood and Affect: Mood normal.     ED Results / Procedures / Treatments   Labs (all labs ordered are listed, but only abnormal results are displayed) Labs Reviewed  BASIC METABOLIC PANEL  CBC WITH DIFFERENTIAL/PLATELET  MAGNESIUM  URINALYSIS, ROUTINE W REFLEX MICROSCOPIC  TSH  CBG MONITORING, ED  TROPONIN I (HIGH SENSITIVITY)    EKG EKG Interpretation  Date/Time:  Friday May 30 2022 14:24:30 EDT Ventricular Rate:  60 PR Interval:  168 QRS Duration: 145 QT Interval:  493 QTC Calculation: 493 R Axis:   -31 Text Interpretation: Sinus rhythm Left bundle branch block No significant change since prior 5/23 Confirmed by Meridee Score [phone]) on 05/30/2022 2:36:32 PM  Radiology No results found.  Procedures Procedures  {Document cardiac monitor, telemetry assessment procedure when appropriate:1}  Medications Ordered in ED Medications  lactated ringers bolus 500 mL (has no administration in time range)    ED Course/ Medical Decision Making/ A&P   {   Click here for ABCD2, HEART and other calculatorsREFRESH Note before signing :1}                          Medical Decision Making Amount and/or Complexity of Data Reviewed Labs: ordered. Radiology: ordered.   ***  {Document critical care time when appropriate:1} {Document review of labs and clinical decision tools ie heart score, Chads2Vasc2 etc:1}  {Document your independent review of radiology images, and any outside records:1} {Document your discussion with family members, caretakers, and with consultants:1} {Document social determinants of health affecting pt's care:1} {Document your decision making why or why not admission, treatments were needed:1} Final Clinical  Impression(s) / ED Diagnoses Final diagnoses:  None    Rx / DC Orders ED Discharge Orders     None

## 2022-05-30 NOTE — ED Notes (Signed)
Patient does not have a pacemaker. 

## 2022-05-30 NOTE — Discharge Instructions (Signed)
You were seen for fainting in the emergency department.  It was likely from dehydration.  At home, please be sure to stay well-hydrated.    Follow-up with your primary doctor in 2-3 days regarding your visit and your mood.  Return immediately to the emergency department if you experience any of the following: Chest pain, shortness of breath, recurrent fainting, or any other concerning symptoms.    Thank you for visiting our Emergency Department. It was a pleasure taking care of you today.

## 2022-05-30 NOTE — ED Triage Notes (Signed)
Pt was seen having a syncope episode and called EMS. Her blood pressure was positive for Orthostatic Hypotension and gave her 50mL of normal saline.

## 2022-06-03 ENCOUNTER — Ambulatory Visit (HOSPITAL_COMMUNITY)
Admission: RE | Admit: 2022-06-03 | Discharge: 2022-06-03 | Disposition: A | Discharge status: Home / Self Care | Payer: Medicare Other | Source: Ambulatory Visit | Attending: Family Medicine | Admitting: Family Medicine

## 2022-06-03 DIAGNOSIS — M8588 Other specified disorders of bone density and structure, other site: Secondary | ICD-10-CM | POA: Diagnosis not present

## 2022-06-03 DIAGNOSIS — M8580 Other specified disorders of bone density and structure, unspecified site: Secondary | ICD-10-CM | POA: Insufficient documentation

## 2022-06-03 DIAGNOSIS — Z780 Asymptomatic menopausal state: Secondary | ICD-10-CM | POA: Insufficient documentation

## 2022-06-03 DIAGNOSIS — Z13820 Encounter for screening for osteoporosis: Secondary | ICD-10-CM | POA: Diagnosis not present

## 2022-11-12 ENCOUNTER — Emergency Department (HOSPITAL_COMMUNITY)
Admission: EM | Admit: 2022-11-12 | Discharge: 2022-11-13 | Disposition: A | Discharge status: Home / Self Care | Payer: Medicare Other | Attending: Emergency Medicine | Admitting: Emergency Medicine

## 2022-11-12 ENCOUNTER — Encounter (HOSPITAL_COMMUNITY): Payer: Self-pay | Admitting: Radiology

## 2022-11-12 ENCOUNTER — Other Ambulatory Visit: Payer: Self-pay

## 2022-11-12 DIAGNOSIS — W19XXXA Unspecified fall, initial encounter: Secondary | ICD-10-CM | POA: Diagnosis not present

## 2022-11-12 DIAGNOSIS — Z7982 Long term (current) use of aspirin: Secondary | ICD-10-CM | POA: Insufficient documentation

## 2022-11-12 DIAGNOSIS — I10 Essential (primary) hypertension: Secondary | ICD-10-CM | POA: Insufficient documentation

## 2022-11-12 DIAGNOSIS — I6782 Cerebral ischemia: Secondary | ICD-10-CM | POA: Diagnosis not present

## 2022-11-12 DIAGNOSIS — Z79899 Other long term (current) drug therapy: Secondary | ICD-10-CM | POA: Diagnosis not present

## 2022-11-12 LAB — CBC WITH DIFFERENTIAL/PLATELET
Abs Immature Granulocytes: 0.03 10*3/uL (ref 0.00–0.07)
Basophils Absolute: 0.1 10*3/uL (ref 0.0–0.1)
Basophils Relative: 1 %
Eosinophils Absolute: 0.2 10*3/uL (ref 0.0–0.5)
Eosinophils Relative: 1 %
HCT: 43.9 % (ref 36.0–46.0)
Hemoglobin: 14.1 g/dL (ref 12.0–15.0)
Immature Granulocytes: 0 %
Lymphocytes Relative: 12 %
Lymphs Abs: 1.3 10*3/uL (ref 0.7–4.0)
MCH: 29.1 pg (ref 26.0–34.0)
MCHC: 32.1 g/dL (ref 30.0–36.0)
MCV: 90.5 fL (ref 80.0–100.0)
Monocytes Absolute: 0.9 10*3/uL (ref 0.1–1.0)
Monocytes Relative: 9 %
Neutro Abs: 8 10*3/uL — ABNORMAL HIGH (ref 1.7–7.7)
Neutrophils Relative %: 77 %
Platelets: 213 10*3/uL (ref 150–400)
RBC: 4.85 MIL/uL (ref 3.87–5.11)
RDW: 12.1 % (ref 11.5–15.5)
WBC: 10.4 10*3/uL (ref 4.0–10.5)
nRBC: 0 % (ref 0.0–0.2)

## 2022-11-12 LAB — COMPREHENSIVE METABOLIC PANEL
ALT: 10 U/L (ref 0–44)
AST: 15 U/L (ref 15–41)
Albumin: 3.9 g/dL (ref 3.5–5.0)
Alkaline Phosphatase: 69 U/L (ref 38–126)
Anion gap: 11 (ref 5–15)
BUN: 14 mg/dL (ref 8–23)
CO2: 21 mmol/L — ABNORMAL LOW (ref 22–32)
Calcium: 9.2 mg/dL (ref 8.9–10.3)
Chloride: 104 mmol/L (ref 98–111)
Creatinine, Ser: 0.86 mg/dL (ref 0.44–1.00)
GFR, Estimated: >60 mL/min (ref >60)
Glucose, Bld: 108 mg/dL — ABNORMAL HIGH (ref 70–99)
Potassium: 3.5 mmol/L (ref 3.5–5.1)
Sodium: 136 mmol/L (ref 135–145)
Total Bilirubin: 0.5 mg/dL (ref <1.2)
Total Protein: 6.7 g/dL (ref 6.5–8.1)

## 2022-11-12 NOTE — ED Triage Notes (Signed)
Pt was found sitting in the driveway but is unsure of how she got there. Pt daughter states she was drenched in sweat. The daughter was able to get her up and in the house and took her BP and it was 184/98-200/97.  Daughter is all over the place with information. Very difficult to follow the information being provided.

## 2022-11-13 ENCOUNTER — Emergency Department (HOSPITAL_COMMUNITY): Payer: Medicare Other

## 2022-11-13 LAB — URINALYSIS, ROUTINE W REFLEX MICROSCOPIC
Bilirubin Urine: NEGATIVE
Glucose, UA: NEGATIVE mg/dL
Ketones, ur: NEGATIVE mg/dL
Leukocytes,Ua: NEGATIVE
Nitrite: NEGATIVE
Protein, ur: 30 mg/dL — AB
Specific Gravity, Urine: 1.019 (ref 1.005–1.030)
pH: 5 (ref 5.0–8.0)

## 2022-11-13 LAB — CBG MONITORING, ED: Glucose-Capillary: 107 mg/dL — ABNORMAL HIGH (ref 70–99)

## 2022-11-13 MED ORDER — HYDRALAZINE HCL 20 MG/ML IJ SOLN
5.0000 mg | Freq: Once | INTRAMUSCULAR | Status: AC
  Administered 2022-11-13: 5 mg via INTRAVENOUS
  Filled 2022-11-13: qty 1

## 2022-11-13 NOTE — ED Provider Notes (Signed)
[place] EMERGENCY DEPARTMENT AT [facility] Provider Note   CSN: [id number] Arrival date & time: 11/12/22  1836     History  Chief Complaint  Patient presents with   Hypertension   Fall    Jackie Phelps is a 89 y.o. female.  HPI      This is an 89 year old female who presents with her daughter with concerns for high blood pressure.  Daughter reports that she had an event this afternoon.  She found her mother outside on the ground.  She is unsure whether she may have fallen but she appeared to be diaphoretic.  Patient herself has a history of confusion and was unable to provide any history but did not endorse any symptoms at that time.  Daughter states that they have had a stressful several months with the recent passing of her father.  She is now primary caregiver for her mother.  She reports that she recently had her carvedilol and HCTZ adjusted down after some episodes of hypotension and near syncope.  She had noted that her blood pressures had begun to trend up 130s to 150s systolic.  She has not taken her blood pressure in over a week.  She took her blood pressure after this event and noted it to be high 180s to 200s over 90s.  Patient herself denies any chest pain, shortness of breath, headache.  Home Medications Prior to Admission medications   Medication Sig Start Date End Date Taking? Authorizing Provider  aspirin EC 81 MG tablet Take 1 tablet by mouth 2 (two) times daily with a meal.    [provider]  carvedilol (COREG) 6.25 MG tablet Take 2 tablets (12.5 mg total) by mouth 2 (two) times daily. 08/13/20   Margaretann Loveless, PA-C  cyanocobalamin (VITAMIN B12) 1000 MCG tablet Take by mouth.    [provider]  donepezil (ARICEPT) 10 MG tablet Take 10 mg by mouth daily. 06/18/16   [provider]  escitalopram (LEXAPRO) 5 MG tablet Take 1 tablet (5 mg total) by mouth daily. 08/13/20   Margaretann Loveless, PA-C  hydrochlorothiazide  (HYDRODIURIL) 12.5 MG tablet Take 1 tablet (12.5 mg total) by mouth daily. 08/13/20 05/30/22  Margaretann Loveless, PA-C  memantine (NAMENDA) 10 MG tablet Take 10 mg by mouth 2 (two) times daily.  06/18/16   [provider]  multivitamin-lutein (OCUVITE-LUTEIN) CAPS capsule Take 1 capsule by mouth daily. 07/18/16   [provider]  simvastatin (ZOCOR) 20 MG tablet Take 1 tablet (20 mg total) by mouth daily. 08/13/20   Margaretann Loveless, PA-C      Allergies    Penicillins    Review of Systems   Review of Systems  Constitutional:  Negative for fever.  Respiratory:  Negative for shortness of breath.   Cardiovascular:  Negative for chest pain.  Neurological:  Negative for weakness, numbness and headaches.  All other systems reviewed and are negative.   Physical Exam Updated Vital Signs BP (!) 177/74   Pulse 62   Temp 99.1 F (37.3 C)   Resp 14   Ht 1.626 m (5\' 4" )   Wt 55.8 kg   SpO2 98%   BMI 21.11 kg/m  Physical Exam Vitals and nursing note reviewed.  Constitutional:      Appearance: She is well-developed. She is not ill-appearing.  HENT:     Head: Normocephalic and atraumatic.  Eyes:     Pupils: Pupils are equal, round, and reactive to light.  Cardiovascular:     Rate and Rhythm: Normal rate and regular rhythm.     Heart sounds: Normal heart sounds.  Pulmonary:     Effort: Pulmonary effort is normal. No respiratory distress.     Breath sounds: No wheezing.  Abdominal:     Palpations: Abdomen is soft.     Tenderness: There is no abdominal tenderness.  Musculoskeletal:     Cervical back: Neck supple.     Comments: Trace bilateral lower extremity edema  Skin:    General: Skin is warm and dry.  Neurological:     Mental Status: She is alert and oriented to person, place, and time.     Comments: Cranial nerves II through XII intact, 5 out of 5 strength in all 4 extremities  Psychiatric:        Mood and Affect: Mood normal.     ED Results / Procedures  / Treatments   Labs (all labs ordered are listed, but only abnormal results are displayed) Labs Reviewed  CBC WITH DIFFERENTIAL/PLATELET - Abnormal; Notable for the following components:      Result Value   Neutro Abs 8.0 (*)    All other components within normal limits  COMPREHENSIVE METABOLIC PANEL - Abnormal; Notable for the following components:   CO2 21 (*)    Glucose, Bld 108 (*)    All other components within normal limits  URINALYSIS, ROUTINE W REFLEX MICROSCOPIC - Abnormal; Notable for the following components:   Hgb urine dipstick MODERATE (*)    Protein, ur 30 (*)    Bacteria, UA RARE (*)    All other components within normal limits  CBG MONITORING, ED - Abnormal; Notable for the following components:   Glucose-Capillary 107 (*)    All other components within normal limits    EKG EKG Interpretation Date/Time:  Wednesday November 12 2022 23:46:44 EST Ventricular Rate:  64 PR Interval:  187 QRS Duration:  141 QT Interval:  499 QTC Calculation: 515 R Axis:   -28  Text Interpretation: Sinus rhythm Left bundle branch block No significant change since last tracing Confirmed by Ross Marcus ([id number]) on 11/13/2022 2:39:04 AM  Radiology CT Head Wo Contrast  Result Date: 11/13/2022 CLINICAL DATA:  Mental status change, unknown cause. EXAM: CT HEAD WITHOUT CONTRAST TECHNIQUE: Contiguous axial images were obtained from the base of the skull through the vertex without intravenous contrast. RADIATION DOSE REDUCTION: This exam was performed according to the departmental dose-optimization program which includes automated exposure control, adjustment of the mA and/or kV according to patient size and/or use of iterative reconstruction technique. COMPARISON:  05/30/2022. FINDINGS: Brain: No acute intracranial hemorrhage, midline shift or mass effect is seen. No extra-axial fluid collection. Diffuse atrophy is noted. Subcortical and periventricular white matter hypodensities are present  bilaterally. No hydrocephalus is seen. Vascular: No hyperdense vessel or unexpected calcification. Skull: Normal. Negative for fracture or focal lesion. Sinuses/Orbits: There is partial opacification of the ethmoid air cells on the left. No acute orbital abnormality. Other: None. IMPRESSION: 1. No acute intracranial process. 2. Atrophy with chronic microvascular ischemic changes. Electronically Signed   By: Thornell Sartorius M.D.   On: 11/13/2022 00:46    Procedures Procedures    Medications Ordered in ED Medications  hydrALAZINE (APRESOLINE) injection 5 mg (5 mg Intravenous Given 11/13/22 0139)    ED Course/ Medical Decision Making/ A&P  Medical Decision Making Amount and/or Complexity of Data Reviewed Labs: ordered. Radiology: ordered.  Risk Prescription drug management.   This patient presents to the ED for concern of high blood pressure, this involves an extensive number of treatment options, and is a complaint that carries with it a high risk of complications and morbidity.  I considered the following differential and admission for this acute, potentially life threatening condition.  The differential diagnosis includes essential hypertension, hypertensive urgency, hypertensive emergency  MDM:    This is an 89 year old female who presents with her daughter with concerns for high blood pressure.  Daughter found her on the ground outside and she had difficulty getting up.  She appeared diaphoretic but at that time was without any complaint.  She is nontoxic-appearing and vital signs are notable for elevated blood pressure in the 200s over 90s.  She has no signs or symptoms of stroke at this time.  No obvious traumatic injuries and she denies falling.  She just had difficulty getting up.  Labs obtained and reviewed.  These are largely unremarkable.  EKG does not show any signs of acute ischemia arrhythmia.  CT head is negative for acute intracranial abnormality.   Patient was given 1 dose of IV hydralazine with nice downtrending of her blood pressure.  Daughter appears very anxious regarding her numbers.  She was taken off HCTZ and her carvedilol was lowered because of syncope and hypotension in May.  Because of this would defer to her primary doctor for adjustments.  She likely needs to be readjusted again with increasing or adding back HCTZ.  Daughter will call primary physician first thing in the morning.  She is comfortable with this plan.  No signs or symptoms of hypertensive urgency or emergency at this time.  (Labs, imaging, consults)  Labs: I Ordered, and personally interpreted labs.  The pertinent results include: CBC, CMP, urinalysis  Imaging Studies ordered: I ordered imaging studies including CT head I independently visualized and interpreted imaging. I agree with the radiologist interpretation  Additional history obtained from chart review and daughter.  External records from outside source obtained and reviewed including clinic visits  Cardiac Monitoring: The patient was maintained on a cardiac monitor.  If on the cardiac monitor, I personally viewed and interpreted the cardiac monitored which showed an underlying rhythm of: Sinus  Reevaluation: After the interventions noted above, I reevaluated the patient and found that they have :improved  Social Determinants of Health:  lives with daughter  Disposition: Discharge  Co morbidities that complicate the patient evaluation  Past Medical History:  Diagnosis Date   Anxiety    Complication of anesthesia    Has increased BP after surgery.   Constipation    Dementia (HCC)    HLD (hyperlipidemia) 10/26/2018   Hypercholesteremia    Hypertension    Impaired memory    due to severe sleep apnea,on CPAP now;stopped breathing 53.9 times per hour previously on study.   Sleep apnea    Viral warts 10/26/2018   Vitamin D deficiency disease 10/26/2018     Medicines Meds ordered this  encounter  Medications   hydrALAZINE (APRESOLINE) injection 5 mg    I have reviewed the patients home medicines and have made adjustments as needed  Problem List / ED Course: Problem List Items Addressed This Visit       Cardiovascular and Mediastinum   Hypertension - Primary                Final Clinical  Impression(s) / ED Diagnoses Final diagnoses:  Hypertension, unspecified type    Rx / DC Orders ED Discharge Orders     None         Cassie Henkels, Mayer Masker, MD 11/13/22 [phone]

## 2022-11-13 NOTE — Discharge Instructions (Signed)
You were seen today for high blood pressure.  Discussed with your primary doctor reinitiating and increasing your blood pressure medication.  If you develop headache, strokelike symptoms, any new or worsening symptoms, you should be reevaluated.

## 2023-02-04 ENCOUNTER — Encounter (HOSPITAL_COMMUNITY): Payer: Self-pay | Admitting: Emergency Medicine

## 2023-02-04 ENCOUNTER — Other Ambulatory Visit: Payer: Self-pay

## 2023-02-04 ENCOUNTER — Emergency Department (HOSPITAL_COMMUNITY)
Admission: EM | Admit: 2023-02-04 | Discharge: 2023-02-04 | Disposition: A | Discharge status: Home / Self Care | Payer: Medicare Other | Attending: Emergency Medicine | Admitting: Emergency Medicine

## 2023-02-04 DIAGNOSIS — N390 Urinary tract infection, site not specified: Secondary | ICD-10-CM | POA: Diagnosis not present

## 2023-02-04 DIAGNOSIS — F0390 Unspecified dementia without behavioral disturbance: Secondary | ICD-10-CM | POA: Diagnosis not present

## 2023-02-04 DIAGNOSIS — E876 Hypokalemia: Secondary | ICD-10-CM | POA: Diagnosis not present

## 2023-02-04 DIAGNOSIS — R55 Syncope and collapse: Secondary | ICD-10-CM | POA: Diagnosis present

## 2023-02-04 DIAGNOSIS — Z7982 Long term (current) use of aspirin: Secondary | ICD-10-CM | POA: Diagnosis not present

## 2023-02-04 LAB — BASIC METABOLIC PANEL
Anion gap: 11 (ref 5–15)
BUN: 17 mg/dL (ref 8–23)
CO2: 25 mmol/L (ref 22–32)
Calcium: 9.2 mg/dL (ref 8.9–10.3)
Chloride: 99 mmol/L (ref 98–111)
Creatinine, Ser: 1.06 mg/dL — ABNORMAL HIGH (ref 0.44–1.00)
GFR, Estimated: 50 mL/min — ABNORMAL LOW (ref >60)
Glucose, Bld: 153 mg/dL — ABNORMAL HIGH (ref 70–99)
Potassium: 2.9 mmol/L — ABNORMAL LOW (ref 3.5–5.1)
Sodium: 135 mmol/L (ref 135–145)

## 2023-02-04 LAB — URINALYSIS, ROUTINE W REFLEX MICROSCOPIC
Bilirubin Urine: NEGATIVE
Glucose, UA: NEGATIVE mg/dL
Ketones, ur: NEGATIVE mg/dL
Nitrite: NEGATIVE
Protein, ur: NEGATIVE mg/dL
RBC / HPF: >50 RBC/hpf (ref 0–5)
Specific Gravity, Urine: 1.016 (ref 1.005–1.030)
pH: 6 (ref 5.0–8.0)

## 2023-02-04 LAB — CBC
HCT: 37.9 % (ref 36.0–46.0)
Hemoglobin: 12.9 g/dL (ref 12.0–15.0)
MCH: 30.1 pg (ref 26.0–34.0)
MCHC: 34 g/dL (ref 30.0–36.0)
MCV: 88.6 fL (ref 80.0–100.0)
Platelets: 191 10*3/uL (ref 150–400)
RBC: 4.28 MIL/uL (ref 3.87–5.11)
RDW: 12.2 % (ref 11.5–15.5)
WBC: 6.7 10*3/uL (ref 4.0–10.5)
nRBC: 0 % (ref 0.0–0.2)

## 2023-02-04 MED ORDER — POTASSIUM CHLORIDE 20 MEQ PO PACK
40.0000 meq | PACK | Freq: Once | ORAL | Status: AC
  Administered 2023-02-04: 40 meq via ORAL
  Filled 2023-02-04: qty 2

## 2023-02-04 MED ORDER — POTASSIUM CHLORIDE CRYS ER 20 MEQ PO TBCR: EXTENDED_RELEASE_TABLET | ORAL | 0 refills | Status: DC

## 2023-02-04 MED ORDER — LACTATED RINGERS IV BOLUS
1000.0000 mL | Freq: Once | INTRAVENOUS | Status: AC
  Administered 2023-02-04: 1000 mL via INTRAVENOUS

## 2023-02-04 MED ORDER — CEPHALEXIN 500 MG PO CAPS: 500.0000 mg | ORAL_CAPSULE | Freq: Three times a day (TID) | ORAL | 0 refills | Status: DC

## 2023-02-04 MED ORDER — SODIUM CHLORIDE 0.9 % IV SOLN
1.0000 g | Freq: Once | INTRAVENOUS | Status: AC
  Administered 2023-02-04: 1 g via INTRAVENOUS
  Filled 2023-02-04: qty 10

## 2023-02-04 NOTE — ED Provider Notes (Signed)
[facility] Provider Note   CSN: [id number] Arrival date & time: 02/04/23  1905     History  Chief Complaint  Patient presents with   Altered Mental Status    Jackie Phelps is a 89 y.o. female.  Pt with hx dementia presents via EMS s/p syncopal event. Per report, had stood, and then was noted to have syncopal event, did not hit head. On ems arrival pt was alert, awake, mental status at baseline without complaint. Ems did not orthostatic vitals drop in bp. On arrival to ED pt smiling, conversant, denies any c/o. No headache. No chest pain or discomfort. No sob. No abd pain or nvd. No dysuria or gu c/o. Denies cough, sore throat, or uri symptoms. No extremity injury, pain or swelling. Pt unaware of any recent change in meds.   The history is provided by the patient, medical records and the EMS personnel. The history is limited by the condition of the patient.  Altered Mental Status Associated symptoms: no abdominal pain, no fever, no headaches, no palpitations, no vomiting and no weakness        Home Medications Prior to Admission medications   Medication Sig Start Date End Date Taking? Authorizing Provider  cephALEXin (KEFLEX) 500 MG capsule Take 1 capsule (500 mg total) by mouth 3 (three) times daily. 02/04/23  Yes Cathren Laine, MD  potassium chloride SA (KLOR-CON M) 20 MEQ tablet One po bid x 2 days, then one po once a day 02/04/23  Yes Cathren Laine, MD  aspirin EC 81 MG tablet Take 1 tablet by mouth 2 (two) times daily with a meal.    [provider]  carvedilol (COREG) 6.25 MG tablet Take 2 tablets (12.5 mg total) by mouth 2 (two) times daily. 08/13/20   Margaretann Loveless, PA-C  cyanocobalamin (VITAMIN B12) 1000 MCG tablet Take by mouth.    [provider]  donepezil (ARICEPT) 10 MG tablet Take 10 mg by mouth daily. 06/18/16   [provider]  escitalopram (LEXAPRO) 5 MG tablet Take 1 tablet (5 mg total)  by mouth daily. 08/13/20   Margaretann Loveless, PA-C  hydrochlorothiazide (HYDRODIURIL) 12.5 MG tablet Take 1 tablet (12.5 mg total) by mouth daily. 08/13/20 05/30/22  Margaretann Loveless, PA-C  memantine (NAMENDA) 10 MG tablet Take 10 mg by mouth 2 (two) times daily.  06/18/16   [provider]  multivitamin-lutein (OCUVITE-LUTEIN) CAPS capsule Take 1 capsule by mouth daily. 07/18/16   [provider]  simvastatin (ZOCOR) 20 MG tablet Take 1 tablet (20 mg total) by mouth daily. 08/13/20   Margaretann Loveless, PA-C      Allergies    Penicillins    Review of Systems   Review of Systems  Constitutional:  Negative for chills and fever.  HENT:  Negative for sore throat.   Eyes:  Negative for pain, redness and visual disturbance.  Respiratory:  Negative for cough and shortness of breath.   Cardiovascular:  Negative for chest pain, palpitations and leg swelling.  Gastrointestinal:  Negative for abdominal pain, blood in stool, diarrhea and vomiting.  Genitourinary:  Negative for dysuria and flank pain.  Musculoskeletal:  Negative for back pain and neck pain.  Skin:  Negative for wound.  Neurological:  Negative for speech difficulty, weakness, numbness and headaches.    Physical Exam Updated Vital Signs BP 121/62   Pulse 78   Temp 98.2 F (36.8 C) (Oral)   Resp 16  Ht 1.626 m (5\' 4" )   Wt 56 kg   SpO2 92%   BMI 21.19 kg/m  Physical Exam Vitals and nursing note reviewed.  Constitutional:      Appearance: Normal appearance. She is well-developed.  HENT:     Head: Atraumatic.     Comments: No facial or scalp contusion.     Nose: Nose normal.     Mouth/Throat:     Mouth: Mucous membranes are moist.  Eyes:     General: No scleral icterus.    Conjunctiva/sclera: Conjunctivae normal.     Pupils: Pupils are equal, round, and reactive to light.  Neck:     Vascular: No carotid bruit.     Trachea: No tracheal deviation.     Comments: Normal rom, non tender. No stiffness  or rigidity.  Cardiovascular:     Rate and Rhythm: Normal rate and regular rhythm.     Pulses: Normal pulses.     Heart sounds: Normal heart sounds. No murmur heard.    No friction rub. No gallop.  Pulmonary:     Effort: Pulmonary effort is normal. No respiratory distress.     Breath sounds: Normal breath sounds.  Chest:     Chest wall: No tenderness.  Abdominal:     General: Bowel sounds are normal. There is no distension.     Palpations: Abdomen is soft. There is no mass.     Tenderness: There is no abdominal tenderness. There is no guarding.     Comments: No pulsatile mass.   Genitourinary:    Comments: No cva tenderness.  Musculoskeletal:        General: No swelling or tenderness.     Cervical back: Normal range of motion and neck supple. No rigidity or tenderness. No muscular tenderness.     Right lower leg: No edema.     Left lower leg: No edema.     Comments: CTLS spine, non tender, aligned, no step off. Good rom bil extremities without pain or focal bony tenderness.   Skin:    General: Skin is warm and dry.     Findings: No rash.  Neurological:     Mental Status: She is alert.     Comments: Alert, speech normal. GCS 15. Motor/sens grossly intact bil. Stre 5/5.   Psychiatric:        Mood and Affect: Mood normal.     ED Results / Procedures / Treatments   Labs (all labs ordered are listed, but only abnormal results are displayed) Results for orders placed or performed during the hospital encounter of 02/04/23  Basic metabolic panel   Collection Time: 02/04/23  8:09 PM  Result Value Ref Range   Sodium 135 135 - 145 mmol/L   Potassium 2.9 (L) 3.5 - 5.1 mmol/L   Chloride 99 98 - 111 mmol/L   CO2 25 22 - 32 mmol/L   Glucose, Bld 153 (H) 70 - 99 mg/dL   BUN 17 8 - 23 mg/dL   Creatinine, Ser 1.61 (H) 0.44 - 1.00 mg/dL   Calcium 9.2 8.9 - 09.6 mg/dL   GFR, Estimated 50 (L) >60 mL/min   Anion gap 11 5 - 15  CBC   Collection Time: 02/04/23  8:09 PM  Result Value  Ref Range   WBC 6.7 4.0 - 10.5 K/uL   RBC 4.28 3.87 - 5.11 MIL/uL   Hemoglobin 12.9 12.0 - 15.0 g/dL   HCT 04.5 40.9 - 81.1 %   MCV 88.6 80.0 -  100.0 fL   MCH 30.1 26.0 - 34.0 pg   MCHC 34.0 30.0 - 36.0 g/dL   RDW 16.1 09.6 - 04.5 %   Platelets 191 150 - 400 K/uL   nRBC 0.0 0.0 - 0.2 %  Urinalysis, Routine w reflex microscopic -Urine, Catheterized   Collection Time: 02/04/23  9:17 PM  Result Value Ref Range   Color, Urine AMBER (A) YELLOW   APPearance CLOUDY (A) CLEAR   Specific Gravity, Urine 1.016 1.005 - 1.030   pH 6.0 5.0 - 8.0   Glucose, UA NEGATIVE NEGATIVE mg/dL   Hgb urine dipstick SMALL (A) NEGATIVE   Bilirubin Urine NEGATIVE NEGATIVE   Ketones, ur NEGATIVE NEGATIVE mg/dL   Protein, ur NEGATIVE NEGATIVE mg/dL   Nitrite NEGATIVE NEGATIVE   Leukocytes,Ua SMALL (A) NEGATIVE   RBC / HPF >50 0 - 5 RBC/hpf   WBC, UA 21-50 0 - 5 WBC/hpf   Bacteria, UA MANY (A) NONE SEEN   Squamous Epithelial / HPF 21-50 0 - 5 /HPF     EKG EKG Interpretation Date/Time:  Wednesday February 04 2023 20:06:56 EST Ventricular Rate:  64 PR Interval:  184 QRS Duration:  147 QT Interval:  506 QTC Calculation: 523 R Axis:   -11  Text Interpretation: Sinus rhythm Left bundle branch block No significant change since last tracing Confirmed by Cathren Laine ([id number]) on 02/04/2023 8:31:49 PM  Radiology No results found.  Procedures Procedures    Medications Ordered in ED Medications  potassium chloride (KLOR-CON) packet 40 mEq (has no administration in time range)  cefTRIAXone (ROCEPHIN) 1 g in sodium chloride 0.9 % 100 mL IVPB (has no administration in time range)  lactated ringers bolus 1,000 mL (1,000 mLs Intravenous Bolus 02/04/23 2039)    ED Course/ Medical Decision Making/ A&P                                 Medical Decision Making Problems Addressed: Acute UTI: acute illness or injury with systemic symptoms Dementia without behavioral disturbance (HCC): chronic illness or injury  that poses a threat to life or bodily functions Hypokalemia: acute illness or injury Syncope, unspecified syncope type: acute illness or injury with systemic symptoms that poses a threat to life or bodily functions  Amount and/or Complexity of Data Reviewed Independent Historian: EMS    Details: hx External Data Reviewed: notes. Labs: ordered. Decision-making details documented in ED Course. ECG/medicine tests: ordered and independent interpretation performed. Decision-making details documented in ED Course.  Risk Prescription drug management. Decision regarding hospitalization.   Iv ns. Continuous pulse ox and cardiac monitoring. Labs ordered/sent.   Differential diagnosis includes syncope, orthostasis, anemia, dehydration, etc. Dispo decision including potential need for admission considered - will get labs and reassess.   Reviewed nursing notes and prior charts for additional history. External reports reviewed. Additional history from: EMS.   Cardiac monitor: sinus rhythm, rate 70.  Labs reviewed/interpreted by me - wbc and hgb normal. K mildly low. Kcl po. Ua c/w uti. Rocephin iv. Rx for home.   Po fluids/food. Ambulate in hall.  Recheck pt, no new c/o, indicates feels fine and ready to go home. No pain. No faintness. No sob.   Pt currently appears stable for d/c.   Rec pcp f/u.  Return precautions provided.              Final Clinical Impression(s) / ED Diagnoses Final diagnoses:  Syncope, unspecified syncope  type  Dementia without behavioral disturbance (HCC)  Hypokalemia  Acute UTI    Rx / DC Orders ED Discharge Orders          Ordered    potassium chloride SA (KLOR-CON M) 20 MEQ tablet        02/04/23 2140    cephALEXin (KEFLEX) 500 MG capsule  3 times daily        02/04/23 2156              Cathren Laine, MD 02/04/23 2157

## 2023-02-04 NOTE — ED Notes (Signed)
Pt given crackers and water, and was able to keep it down.

## 2023-02-04 NOTE — ED Triage Notes (Signed)
Per daughter pt is more confused today. Pt has dementia. Ems states she is orthostatic.

## 2023-02-04 NOTE — ED Notes (Signed)
Daughter called and made aware that patient is currently on the way home.

## 2023-02-04 NOTE — Discharge Instructions (Addendum)
It was our pleasure to provide your ER care today - we hope that you feel better.  Fall precautions. Make sure to stay well hydrated/drink plenty of fluids. Your lab work shows a possible urine infection - take keflex (antibiotic) as prescribed.   Follow up closely with primary care doctor in the next 1-2 weeks. Your potassium level is low - eat plenty of fruits and vegetables, take potassium supplement as prescribed, and follow up with primary care doctor in 1-2 weeks.   Return to ER if worse, new symptoms, fevers, weak/fainting, chest pain, trouble breathing, new/severe pain, or other concern.

## 2023-02-04 NOTE — ED Notes (Signed)
Pt has been placed on the transportation list back home

## 2023-06-06 ENCOUNTER — Emergency Department (HOSPITAL_COMMUNITY)

## 2023-06-06 ENCOUNTER — Inpatient Hospital Stay (HOSPITAL_COMMUNITY)
Admission: EM | Admit: 2023-06-06 | Discharge: 2023-06-08 | DRG: 690 | Disposition: A | Discharge status: Home Health | Attending: Internal Medicine | Admitting: Internal Medicine

## 2023-06-06 ENCOUNTER — Encounter (HOSPITAL_COMMUNITY): Payer: Self-pay | Admitting: *Deleted

## 2023-06-06 ENCOUNTER — Other Ambulatory Visit: Payer: Self-pay

## 2023-06-06 DIAGNOSIS — Z8249 Family history of ischemic heart disease and other diseases of the circulatory system: Secondary | ICD-10-CM

## 2023-06-06 DIAGNOSIS — Z96643 Presence of artificial hip joint, bilateral: Secondary | ICD-10-CM | POA: Diagnosis present

## 2023-06-06 DIAGNOSIS — F03A4 Unspecified dementia, mild, with anxiety: Secondary | ICD-10-CM | POA: Diagnosis present

## 2023-06-06 DIAGNOSIS — R531 Weakness: Secondary | ICD-10-CM

## 2023-06-06 DIAGNOSIS — E782 Mixed hyperlipidemia: Secondary | ICD-10-CM | POA: Diagnosis present

## 2023-06-06 DIAGNOSIS — F0390 Unspecified dementia without behavioral disturbance: Secondary | ICD-10-CM | POA: Insufficient documentation

## 2023-06-06 DIAGNOSIS — Z792 Long term (current) use of antibiotics: Secondary | ICD-10-CM

## 2023-06-06 DIAGNOSIS — R9431 Abnormal electrocardiogram [ECG] [EKG]: Secondary | ICD-10-CM | POA: Insufficient documentation

## 2023-06-06 DIAGNOSIS — N390 Urinary tract infection, site not specified: Secondary | ICD-10-CM | POA: Diagnosis not present

## 2023-06-06 DIAGNOSIS — F419 Anxiety disorder, unspecified: Secondary | ICD-10-CM | POA: Insufficient documentation

## 2023-06-06 DIAGNOSIS — F32A Depression, unspecified: Secondary | ICD-10-CM | POA: Insufficient documentation

## 2023-06-06 DIAGNOSIS — N3001 Acute cystitis with hematuria: Principal | ICD-10-CM

## 2023-06-06 DIAGNOSIS — G4730 Sleep apnea, unspecified: Secondary | ICD-10-CM | POA: Diagnosis present

## 2023-06-06 DIAGNOSIS — Z79899 Other long term (current) drug therapy: Secondary | ICD-10-CM

## 2023-06-06 DIAGNOSIS — Z87891 Personal history of nicotine dependence: Secondary | ICD-10-CM

## 2023-06-06 DIAGNOSIS — E559 Vitamin D deficiency, unspecified: Secondary | ICD-10-CM | POA: Diagnosis present

## 2023-06-06 DIAGNOSIS — I10 Essential (primary) hypertension: Secondary | ICD-10-CM

## 2023-06-06 DIAGNOSIS — E876 Hypokalemia: Secondary | ICD-10-CM | POA: Insufficient documentation

## 2023-06-06 DIAGNOSIS — G3184 Mild cognitive impairment, so stated: Secondary | ICD-10-CM

## 2023-06-06 DIAGNOSIS — Z87440 Personal history of urinary (tract) infections: Secondary | ICD-10-CM

## 2023-06-06 DIAGNOSIS — Z7982 Long term (current) use of aspirin: Secondary | ICD-10-CM

## 2023-06-06 DIAGNOSIS — Z880 Allergy status to penicillin: Secondary | ICD-10-CM

## 2023-06-06 LAB — COMPREHENSIVE METABOLIC PANEL WITH GFR
ALT: 10 U/L (ref 0–44)
AST: 15 U/L (ref 15–41)
Albumin: 3.8 g/dL (ref 3.5–5.0)
Alkaline Phosphatase: 63 U/L (ref 38–126)
Anion gap: 7 (ref 5–15)
BUN: 15 mg/dL (ref 8–23)
CO2: 25 mmol/L (ref 22–32)
Calcium: 8.9 mg/dL (ref 8.9–10.3)
Chloride: 103 mmol/L (ref 98–111)
Creatinine, Ser: 0.91 mg/dL (ref 0.44–1.00)
GFR, Estimated: >60 mL/min (ref >60)
Glucose, Bld: 94 mg/dL (ref 70–99)
Potassium: 3.1 mmol/L — ABNORMAL LOW (ref 3.5–5.1)
Sodium: 135 mmol/L (ref 135–145)
Total Bilirubin: 0.9 mg/dL (ref 0.0–1.2)
Total Protein: 6.7 g/dL (ref 6.5–8.1)

## 2023-06-06 LAB — CBC WITH DIFFERENTIAL/PLATELET
Abs Immature Granulocytes: 0.02 10*3/uL (ref 0.00–0.07)
Basophils Absolute: 0 10*3/uL (ref 0.0–0.1)
Basophils Relative: 0 %
Eosinophils Absolute: 0.1 10*3/uL (ref 0.0–0.5)
Eosinophils Relative: 2 %
HCT: 41.3 % (ref 36.0–46.0)
Hemoglobin: 13.8 g/dL (ref 12.0–15.0)
Immature Granulocytes: 0 %
Lymphocytes Relative: 6 %
Lymphs Abs: 0.4 10*3/uL — ABNORMAL LOW (ref 0.7–4.0)
MCH: 29.8 pg (ref 26.0–34.0)
MCHC: 33.4 g/dL (ref 30.0–36.0)
MCV: 89.2 fL (ref 80.0–100.0)
Monocytes Absolute: 0.4 10*3/uL (ref 0.1–1.0)
Monocytes Relative: 7 %
Neutro Abs: 5.2 10*3/uL (ref 1.7–7.7)
Neutrophils Relative %: 85 %
Platelets: 175 10*3/uL (ref 150–400)
RBC: 4.63 MIL/uL (ref 3.87–5.11)
RDW: 12.2 % (ref 11.5–15.5)
WBC: 6.1 10*3/uL (ref 4.0–10.5)
nRBC: 0 % (ref 0.0–0.2)

## 2023-06-06 LAB — URINALYSIS, ROUTINE W REFLEX MICROSCOPIC
Bacteria, UA: NONE SEEN
Bilirubin Urine: NEGATIVE
Glucose, UA: NEGATIVE mg/dL
Ketones, ur: NEGATIVE mg/dL
Leukocytes,Ua: NEGATIVE
Nitrite: NEGATIVE
Protein, ur: NEGATIVE mg/dL
Specific Gravity, Urine: 1.008 (ref 1.005–1.030)
pH: 6 (ref 5.0–8.0)

## 2023-06-06 MED ORDER — SODIUM CHLORIDE 0.9 % IV SOLN
2.0000 g | Freq: Once | INTRAVENOUS | Status: AC
  Administered 2023-06-06: 2 g via INTRAVENOUS
  Filled 2023-06-06: qty 20

## 2023-06-06 MED ORDER — SODIUM CHLORIDE 0.9 % IV BOLUS
1000.0000 mL | Freq: Once | INTRAVENOUS | Status: AC
  Administered 2023-06-06: 1000 mL via INTRAVENOUS

## 2023-06-06 NOTE — ED Triage Notes (Signed)
 Pt arrives by [facility] from home where she lives with her daughter who is her caregiver.  Pt has dementia. Pt has been having increased generalized weakness and she was dx with a UTI and was started on macrobid yesterday.  Temp 99.4 orally. CBG 110

## 2023-06-06 NOTE — ED Notes (Signed)
 Pt is straight cathed at this time to obtain u/a. Size 8 fr used. Pt tolerated well

## 2023-06-07 ENCOUNTER — Emergency Department (HOSPITAL_COMMUNITY)

## 2023-06-07 DIAGNOSIS — N3001 Acute cystitis with hematuria: Secondary | ICD-10-CM | POA: Diagnosis present

## 2023-06-07 DIAGNOSIS — R9431 Abnormal electrocardiogram [ECG] [EKG]: Secondary | ICD-10-CM | POA: Insufficient documentation

## 2023-06-07 DIAGNOSIS — R531 Weakness: Secondary | ICD-10-CM

## 2023-06-07 DIAGNOSIS — F03A4 Unspecified dementia, mild, with anxiety: Secondary | ICD-10-CM | POA: Diagnosis present

## 2023-06-07 DIAGNOSIS — I10 Essential (primary) hypertension: Secondary | ICD-10-CM | POA: Diagnosis present

## 2023-06-07 DIAGNOSIS — R319 Hematuria, unspecified: Secondary | ICD-10-CM

## 2023-06-07 DIAGNOSIS — F32A Depression, unspecified: Secondary | ICD-10-CM | POA: Insufficient documentation

## 2023-06-07 DIAGNOSIS — Z792 Long term (current) use of antibiotics: Secondary | ICD-10-CM | POA: Diagnosis not present

## 2023-06-07 DIAGNOSIS — Z87440 Personal history of urinary (tract) infections: Secondary | ICD-10-CM | POA: Diagnosis not present

## 2023-06-07 DIAGNOSIS — F0390 Unspecified dementia without behavioral disturbance: Secondary | ICD-10-CM | POA: Insufficient documentation

## 2023-06-07 DIAGNOSIS — G4730 Sleep apnea, unspecified: Secondary | ICD-10-CM | POA: Diagnosis present

## 2023-06-07 DIAGNOSIS — Z79899 Other long term (current) drug therapy: Secondary | ICD-10-CM | POA: Diagnosis not present

## 2023-06-07 DIAGNOSIS — N390 Urinary tract infection, site not specified: Secondary | ICD-10-CM | POA: Diagnosis present

## 2023-06-07 DIAGNOSIS — Z880 Allergy status to penicillin: Secondary | ICD-10-CM | POA: Diagnosis not present

## 2023-06-07 DIAGNOSIS — E782 Mixed hyperlipidemia: Secondary | ICD-10-CM

## 2023-06-07 DIAGNOSIS — F419 Anxiety disorder, unspecified: Secondary | ICD-10-CM

## 2023-06-07 DIAGNOSIS — Z96643 Presence of artificial hip joint, bilateral: Secondary | ICD-10-CM | POA: Diagnosis present

## 2023-06-07 DIAGNOSIS — E876 Hypokalemia: Secondary | ICD-10-CM | POA: Diagnosis present

## 2023-06-07 DIAGNOSIS — Z7982 Long term (current) use of aspirin: Secondary | ICD-10-CM | POA: Diagnosis not present

## 2023-06-07 DIAGNOSIS — E559 Vitamin D deficiency, unspecified: Secondary | ICD-10-CM | POA: Diagnosis present

## 2023-06-07 DIAGNOSIS — Z87891 Personal history of nicotine dependence: Secondary | ICD-10-CM | POA: Diagnosis not present

## 2023-06-07 DIAGNOSIS — Z8249 Family history of ischemic heart disease and other diseases of the circulatory system: Secondary | ICD-10-CM | POA: Diagnosis not present

## 2023-06-07 LAB — CBC
HCT: 38.2 % (ref 36.0–46.0)
Hemoglobin: 13 g/dL (ref 12.0–15.0)
MCH: 30.1 pg (ref 26.0–34.0)
MCHC: 34 g/dL (ref 30.0–36.0)
MCV: 88.4 fL (ref 80.0–100.0)
Platelets: 147 10*3/uL — ABNORMAL LOW (ref 150–400)
RBC: 4.32 MIL/uL (ref 3.87–5.11)
RDW: 12.2 % (ref 11.5–15.5)
WBC: 7.2 10*3/uL (ref 4.0–10.5)
nRBC: 0 % (ref 0.0–0.2)

## 2023-06-07 LAB — COMPREHENSIVE METABOLIC PANEL WITH GFR
ALT: 9 U/L (ref 0–44)
AST: 14 U/L — ABNORMAL LOW (ref 15–41)
Albumin: 3.2 g/dL — ABNORMAL LOW (ref 3.5–5.0)
Alkaline Phosphatase: 52 U/L (ref 38–126)
Anion gap: 10 (ref 5–15)
BUN: 13 mg/dL (ref 8–23)
CO2: 25 mmol/L (ref 22–32)
Calcium: 8.2 mg/dL — ABNORMAL LOW (ref 8.9–10.3)
Chloride: 101 mmol/L (ref 98–111)
Creatinine, Ser: 0.84 mg/dL (ref 0.44–1.00)
GFR, Estimated: >60 mL/min (ref >60)
Glucose, Bld: 114 mg/dL — ABNORMAL HIGH (ref 70–99)
Potassium: 3.2 mmol/L — ABNORMAL LOW (ref 3.5–5.1)
Sodium: 136 mmol/L (ref 135–145)
Total Bilirubin: 0.8 mg/dL (ref 0.0–1.2)
Total Protein: 5.8 g/dL — ABNORMAL LOW (ref 6.5–8.1)

## 2023-06-07 LAB — PHOSPHORUS: Phosphorus: 3 mg/dL (ref 2.5–4.6)

## 2023-06-07 LAB — MAGNESIUM: Magnesium: 2.1 mg/dL (ref 1.7–2.4)

## 2023-06-07 MED ORDER — ACETAMINOPHEN 325 MG PO TABS: 650.0000 mg | ORAL_TABLET | Freq: Four times a day (QID) | ORAL | Status: DC | PRN

## 2023-06-07 MED ORDER — MEMANTINE HCL 10 MG PO TABS
10.0000 mg | ORAL_TABLET | Freq: Two times a day (BID) | ORAL | Status: DC
  Administered 2023-06-07 – 2023-06-08 (×3): 10 mg via ORAL
  Filled 2023-06-07 (×3): qty 1

## 2023-06-07 MED ORDER — POTASSIUM CHLORIDE IN NACL 20-0.9 MEQ/L-% IV SOLN: Freq: Once | INTRAVENOUS | Status: AC

## 2023-06-07 MED ORDER — ENOXAPARIN SODIUM 40 MG/0.4ML IJ SOSY
40.0000 mg | PREFILLED_SYRINGE | INTRAMUSCULAR | Status: DC
  Administered 2023-06-07 – 2023-06-08 (×2): 40 mg via SUBCUTANEOUS
  Filled 2023-06-07 (×2): qty 0.4

## 2023-06-07 MED ORDER — MAGNESIUM SULFATE IN D5W 1-5 GM/100ML-% IV SOLN
1.0000 g | Freq: Once | INTRAVENOUS | Status: AC
  Administered 2023-06-07: 1 g via INTRAVENOUS
  Filled 2023-06-07: qty 100

## 2023-06-07 MED ORDER — PROCHLORPERAZINE EDISYLATE 10 MG/2ML IJ SOLN: 10.0000 mg | Freq: Four times a day (QID) | INTRAMUSCULAR | Status: DC | PRN

## 2023-06-07 MED ORDER — ESCITALOPRAM OXALATE 10 MG PO TABS
5.0000 mg | ORAL_TABLET | Freq: Every day | ORAL | Status: DC
  Administered 2023-06-07 – 2023-06-08 (×2): 5 mg via ORAL
  Filled 2023-06-07 (×2): qty 1

## 2023-06-07 MED ORDER — ACETAMINOPHEN 650 MG RE SUPP: 650.0000 mg | Freq: Four times a day (QID) | RECTAL | Status: DC | PRN

## 2023-06-07 MED ORDER — CARVEDILOL 12.5 MG PO TABS: 12.5000 mg | ORAL_TABLET | Freq: Two times a day (BID) | ORAL | Status: DC

## 2023-06-07 MED ORDER — POTASSIUM CHLORIDE 20 MEQ PO PACK
60.0000 meq | PACK | Freq: Once | ORAL | Status: AC
  Administered 2023-06-07: 60 meq via ORAL
  Filled 2023-06-07: qty 3

## 2023-06-07 MED ORDER — CARVEDILOL 3.125 MG PO TABS
6.2500 mg | ORAL_TABLET | Freq: Two times a day (BID) | ORAL | Status: DC
  Administered 2023-06-07 – 2023-06-08 (×3): 6.25 mg via ORAL
  Filled 2023-06-07 (×3): qty 2

## 2023-06-07 MED ORDER — DONEPEZIL HCL 5 MG PO TABS
10.0000 mg | ORAL_TABLET | Freq: Every day | ORAL | Status: DC
  Administered 2023-06-07: 10 mg via ORAL
  Filled 2023-06-07: qty 2

## 2023-06-07 MED ORDER — SODIUM CHLORIDE 0.9 % IV SOLN
1.0000 g | INTRAVENOUS | Status: DC
  Administered 2023-06-07: 1 g via INTRAVENOUS
  Filled 2023-06-07: qty 10

## 2023-06-07 MED ORDER — SIMVASTATIN 20 MG PO TABS
20.0000 mg | ORAL_TABLET | Freq: Every day | ORAL | Status: DC
  Administered 2023-06-07: 20 mg via ORAL
  Filled 2023-06-07: qty 1

## 2023-06-07 NOTE — H&P (Signed)
 History and Physical    Patient: Jackie Phelps:[id number] DOB: [birth year] DOA: 06/06/2023 DOS: the patient was seen and examined on 06/07/2023 PCP: Rory Collard, MD  Patient coming from: Home  Chief Complaint: No chief complaint on file.  HPI: Jackie Phelps is a 89 y.o. female with medical history significant of hypertension, hyperlipidemia, impaired memory/dementia (mild), anxiety/depression, sleep apnea who presents to the emergency department from home via EMS due to generalized weakness.  Patient was unable to provide history due to underlying dementia, history was obtained from EDP and ED medical record.  Per report patient was recently diagnosed with UTI and was started on Macrobid yesterday, but she has been having increased generalized weakness to the extent that she had difficulty in being able to ambulate.  EMS was activated and patient was sent to the ED for further evaluation and management.  ED Course:  In the emergency department, BP was 190/93, other vital signs were within normal range.  Workup in the ED showed normal CBC BMP except for potassium of 3.1.  Urinalysis was positive for hematuria. CT head without contrast showed no acute intracranial abnormality CT of abdomen and pelvis showed no acute findings She was treated with IV ceftriaxone .  Potassium was replenished, magnesium  was given and IV hydration was provided.  TRH was asked to admit  Review of Systems: Review of systems as noted in the HPI. All other systems reviewed and are negative.   Past Medical History:  Diagnosis Date   Anxiety    Complication of anesthesia    Has increased BP after surgery.   Constipation    Dementia (HCC)    HLD (hyperlipidemia) 10/26/2018   Hypercholesteremia    Hypertension    Impaired memory    due to severe sleep apnea,on CPAP now;stopped breathing 53.9 times per hour previously on study.   Sleep apnea    Viral warts 10/26/2018   Vitamin D  deficiency disease  10/26/2018   Past Surgical History:  Procedure Laterality Date   CATARACT EXTRACTION, BILATERAL     COLONOSCOPY     COLONOSCOPY  03/05/2011   Procedure: COLONOSCOPY;  Surgeon: Ruby Corporal, MD;  Location: [facility];  Service: Endoscopy;  Laterality: N/A;  1200   CYSTOSCOPY W/ RETROGRADES Bilateral 08/11/2016   Procedure: CYSTOSCOPY WITH BILATERAL RETROGRADE PYELOGRAM;  Surgeon: Marco Severs, MD;  Location: [facility] ORS;  Service: Urology;  Laterality: Bilateral;   HIP ARTHROPLASTY Left 01/08/2014   Procedure: ARTHROPLASTY BIPOLAR HIP;  Surgeon: Edison Gore, MD;  Location: [facility] OR;  Service: Orthopedics;  Laterality: Left;   TOTAL HIP ARTHROPLASTY Right 01/28/2020   Procedure: TOTAL HIP ARTHROPLASTY ANTERIOR APPROACH;  Surgeon: Adonica Hoose, MD;  Location: [facility] OR;  Service: Orthopedics;  Laterality: Right;   TRANSURETHRAL RESECTION OF BLADDER TUMOR N/A 08/11/2016   Procedure: TRANSURETHRAL RESECTION OF BLADDER TUMOR (TURBT);  Surgeon: Marco Severs, MD;  Location: [facility] ORS;  Service: Urology;  Laterality: N/A;   TRIGGER FINGER RELEASE Bilateral    ring and middle fingers.    Social History:  reports that she quit smoking about 57 years ago. Her smoking use included cigarettes. She started smoking about 61 years ago. She has a 1 pack-year smoking history. She has never used smokeless tobacco. She reports current alcohol use of about 2.0 standard drinks of alcohol per week. She reports that she does not use drugs.   Allergies  Allergen Reactions   Penicillins Hives and Rash    Has patient had  a PCN reaction causing immediate rash, facial/tongue/throat swelling, SOB or lightheadedness with hypotension: unknown Has patient had a PCN reaction causing severe rash involving mucus membranes or skin necrosis: unknown Has patient had a PCN reaction that required hospitalization: unknown Has patient had a PCN reaction occurring within the last 10 years: unknown  Patient tolerated Ancef   without any complications.    Family History  Problem Relation Age of Onset   Hypertension Mother    Healthy Daughter    Colon cancer Neg Hx      Prior to Admission medications   Medication Sig Start Date End Date Taking? Authorizing Provider  aspirin  EC 81 MG tablet Take 1 tablet by mouth 2 (two) times daily with a meal.    [provider]  carvedilol  (COREG ) 6.25 MG tablet Take 2 tablets (12.5 mg total) by mouth 2 (two) times daily. 08/13/20   Angelia Kelp, PA-C  cephALEXin  (KEFLEX ) 500 MG capsule Take 1 capsule (500 mg total) by mouth 3 (three) times daily. 02/04/23   Steinl, Kevin, MD  cyanocobalamin (VITAMIN B12) 1000 MCG tablet Take by mouth.    [provider]  donepezil  (ARICEPT ) 10 MG tablet Take 10 mg by mouth daily. 06/18/16   [provider]  escitalopram  (LEXAPRO ) 5 MG tablet Take 1 tablet (5 mg total) by mouth daily. 08/13/20   Angelia Kelp, PA-C  hydrochlorothiazide  (HYDRODIURIL ) 12.5 MG tablet Take 1 tablet (12.5 mg total) by mouth daily. 08/13/20 05/30/22  Angelia Kelp, PA-C  memantine  (NAMENDA ) 10 MG tablet Take 10 mg by mouth 2 (two) times daily.  06/18/16   [provider]  multivitamin-lutein  (OCUVITE-LUTEIN ) CAPS capsule Take 1 capsule by mouth daily. 07/18/16   [provider]  potassium chloride  SA (KLOR-CON  M) 20 MEQ tablet One po bid x 2 days, then one po once a day 02/04/23   Steinl, Kevin, MD  simvastatin  (ZOCOR ) 20 MG tablet Take 1 tablet (20 mg total) by mouth daily. 08/13/20   Angelia Kelp, PA-C    Physical Exam: BP (!) 146/73   Pulse 69   Temp 97.6 F (36.4 C) (Oral)   Resp 18   Ht 5\' 2"  (1.575 m)   Wt 59.9 kg   SpO2 98%   BMI 24.15 kg/m   General: 89 y.o. year-old female well developed well nourished in no acute distress.  Alert and oriented x3. HEENT: NCAT, EOMI Neck: Supple, trachea medial Cardiovascular: Regular rate and rhythm with no rubs or gallops.  No thyromegaly or JVD  noted.  No lower extremity edema. 2/4 pulses in all 4 extremities. Respiratory: Clear to auscultation with no wheezes or rales. Good inspiratory effort. Abdomen: Soft, nontender nondistended with normal bowel sounds x4 quadrants. Muskuloskeletal: No cyanosis, clubbing or edema noted bilaterally Neuro: CN II-XII intact, strength 5/5 x 4, sensation, reflexes intact Skin: No ulcerative lesions noted or rashes Psychiatry: Mood is appropriate for condition and setting          Labs on Admission:  Basic Metabolic Panel: Recent Labs  Lab 06/06/23 1808  NA 135  K 3.1*  CL 103  CO2 25  GLUCOSE 94  BUN 15  CREATININE 0.91  CALCIUM 8.9   Liver Function Tests: Recent Labs  Lab 06/06/23 1808  AST 15  ALT 10  ALKPHOS 63  BILITOT 0.9  PROT 6.7  ALBUMIN  3.8   No results for input(s): "LIPASE", "AMYLASE" in the last 168 hours. No results for input(s): "AMMONIA" in the last 168  hours. CBC: Recent Labs  Lab 06/06/23 1808  WBC 6.1  NEUTROABS 5.2  HGB 13.8  HCT 41.3  MCV 89.2  PLT 175   Cardiac Enzymes: No results for input(s): "CKTOTAL", "CKMB", "CKMBINDEX", "TROPONINI" in the last 168 hours.  BNP (last 3 results) No results for input(s): "BNP" in the last 8760 hours.  ProBNP (last 3 results) No results for input(s): "PROBNP" in the last 8760 hours.  CBG: No results for input(s): "GLUCAP" in the last 168 hours.  Radiological Exams on Admission: CT Head Wo Contrast Result Date: 06/07/2023 EXAM: CT HEAD WITHOUT 06/26/2022 TECHNIQUE: CT of the head was performed without the administration of intravenous contrast. Automated exposure control, iterative reconstruction, and/or weight based adjustment of the mA/kV was utilized to reduce the radiation dose to as low as reasonably achievable. COMPARISON: 11/13/2022 CLINICAL HISTORY: FINDINGS: BRAIN AND VENTRICLES: There is no acute intracranial hemorrhage, mass effect or midline shift. No abnormal extra-axial fluid collection. The  gray-white differentiation is maintained without evidence of an acute infarct. Global cortical atrophy with stable secondary ventricular prominence. Subcortical and periventricular small vessel ischemic changes. ORBITS: The visualized portion of the orbits demonstrate no acute abnormality. SINUSES: The visualized paranasal sinuses and mastoid air cells demonstrate no acute abnormality. SOFT TISSUES AND SKULL: No acute abnormality of the visualized skull or soft tissues. IMPRESSION: 1. No acute intracranial abnormality. 2. Global cortical atrophy with small vessel ischemic changes. Electronically signed by: Zadie Herter MD 06/07/2023 12:19 AM EDT RP Workstation: [id number]   CT Renal Stone Study Result Date: 06/06/2023 EXAM: CT UROGRAM 06/06/2023 10:19:27 PM TECHNIQUE: CT of the abdomen and pelvis was performed before and after the administration of intravenous contrast as per CT urogram protocol. Multiplanar reformatted images as well as MIP urogram images are provided for review. Automated exposure control, iterative reconstruction, and/or weight based adjustment of the mA/kV was utilized to reduce the radiation dose to as low as reasonably achievable. COMPARISON: 06/09/2016 CLINICAL HISTORY: Abdominal/flank pain, stone suspected. Weakness, Pt arrives by [facility] from home where she lives with her daughter who is her caregiver. Pt has dementia. Pt has been having increased generalized weakness and she was dx with a UTI and was started on macrobid yesterday. Temp 99.4 orally FINDINGS: LOWER CHEST: Trace bilateral pleural effusions with mild bilateral lower lobe atelectasis. LIVER: The liver is unremarkable. GALLBLADDER AND BILE DUCTS: Gallbladder is unremarkable. No biliary ductal dilatation. SPLEEN: No acute abnormality. PANCREAS: No acute abnormality. ADRENAL GLANDS: No acute abnormality. KIDNEYS, URETERS AND BLADDER: 3.3 cm medial right lower pole simple cyst (image 35), benign (Bosniak 1). Per consensus, no  follow-up is needed for simple Bosniak type 1 and 2 renal cysts, unless the patient has a malignancy history or risk factors. No stones in the kidneys or ureters. No evidence of hydronephrosis. No evidence of perinephric or periureteral stranding. Urinary bladder is unremarkable. GI AND BOWEL: Stomach demonstrates no acute abnormality. There is no evidence of bowel obstruction. Normal appendix (image 56). Left colonic diverticulosis, without evidence of diverticulitis. PERITONEUM AND RETROPERITONEUM: No evidence of ascites. No free air. VASCULATURE: Atherosclerotic calcifications of the abdominal aorta and branch vessels. LYMPH NODES: No evidence of lymphadenopathy. REPRODUCTIVE ORGANS: Calcified uterine fibroids. BONES AND SOFT TISSUES: Bilateral hip arthroplasties without evidence of complication. No acute osseous abnormality. No focal soft tissue abnormality. IMPRESSION: 1. No acute findings. 2. Left colonic diverticulosis without evidence of diverticulitis. 3. Additional ancillary findings as above. Electronically signed by: Zadie Herter MD 06/06/2023 10:24 PM EDT RP Workstation: [id number]  EKG: I independently viewed the EKG done and my findings are as followed: Normal sinus rhythm at a rate of 82 bpm with LBBB and QTc of 516 ms  Assessment/Plan Present on Admission:  UTI (urinary tract infection)  Essential hypertension  Mixed hyperlipidemia  Principal Problem:   UTI (urinary tract infection) Active Problems:   Essential hypertension   Mixed hyperlipidemia   Generalized weakness   Hypokalemia   Prolonged QT interval   Dementia without behavioral disturbance (HCC)   Anxiety and depression  UTI POA Continue IV ceftriaxone  Urine culture pending  Generalized weakness Continue fall precaution Continue PT/OT eval and treat  Hypokalemia K= 3.1, this was replenished  Prolonged QT interval QTc 516 ms Avoid QT prolonging drugs K+ 3.1, this was replenished Magnesium  level will  be checked Repeat EKG in the morning  Essential hypertension Continue Coreg   Mixed hyperlipidemia Continue Zocor   History of impaired memory/dementia: Mild Continue Aricept  and Namenda  Constant reorientation and supportive care   Depression/anxiety Patient denies suicidal ideation, hallucinations Continue Lexapro .   DVT prophylaxis: Lovenox    Code Status: Full code  Family Communication: None at bedside.  Consults: None  Severity of Illness: The appropriate patient status for this patient is INPATIENT. Inpatient status is judged to be reasonable and necessary in order to provide the required intensity of service to ensure the patient's safety. The patient's presenting symptoms, physical exam findings, and initial radiographic and laboratory data in the context of their chronic comorbidities is felt to place them at high risk for further clinical deterioration. Furthermore, it is not anticipated that the patient will be medically stable for discharge from the hospital within 2 midnights of admission.   * I certify that at the point of admission it is my clinical judgment that the patient will require inpatient hospital care spanning beyond 2 midnights from the point of admission due to high intensity of service, high risk for further deterioration and high frequency of surveillance required.*  Author: Shakhia Gramajo, DO 06/07/2023 7:42 AM  For on call review [email or web address].

## 2023-06-07 NOTE — Progress Notes (Signed)
 Jackie Phelps is a 89 y.o. female with medical history significant of hypertension, hyperlipidemia, impaired memory/dementia (mild), anxiety/depression, sleep apnea who presents to the emergency department from home via EMS due to generalized weakness.  Patient was unable to provide history due to underlying dementia, history was obtained from EDP and ED medical record.  Per report patient was recently diagnosed with UTI and was started on Macrobid yesterday, but she has been having increased generalized weakness to the extent that she had difficulty in being able to ambulate.  EMS was activated and patient was sent to the ED for further evaluation and management.   Patient was admitted with generalized weakness in the setting of UTI and remains on IV Rocephin  with urine cultures pending.  PT/OT evaluation currently pending.  Noted to have hypokalemia which is being repleted.  A.m. labs ordered.  Patient has been seen and evaluated at bedside and appears pleasantly confused.  She has been admitted after midnight.  Total care time: 30 minutes.

## 2023-06-07 NOTE — ED Notes (Addendum)
 the pt failed the ambulation trial. The pt was unable to sit up on her own and when we tried to sit on the side of the bed, this nurse was holding all of the pt's weight to sit her up. The daughter feels uncomfortable bringing her home with how weak and altered the pt currently is.

## 2023-06-07 NOTE — Progress Notes (Signed)
   06/07/23 1203  TOC Brief Assessment  Insurance and Status Reviewed  Patient has primary care physician Yes (BRONSTEIN, DAVID)  Home environment has been reviewed From home with daughter  Prior level of function: Need Assistance/daughter caretaker  Prior/Current Home Services No current home services  Social Drivers of Health Review SDOH reviewed no interventions necessary  Readmission risk has been reviewed Yes  Transition of care needs transition of care needs identified, TOC will continue to follow (Pending PT/OT eval.)

## 2023-06-07 NOTE — Plan of Care (Signed)

## 2023-06-08 DIAGNOSIS — R319 Hematuria, unspecified: Secondary | ICD-10-CM | POA: Diagnosis not present

## 2023-06-08 DIAGNOSIS — N390 Urinary tract infection, site not specified: Secondary | ICD-10-CM | POA: Diagnosis not present

## 2023-06-08 LAB — CBC
HCT: 36.4 % (ref 36.0–46.0)
Hemoglobin: 11.8 g/dL — ABNORMAL LOW (ref 12.0–15.0)
MCH: 28.6 pg (ref 26.0–34.0)
MCHC: 32.4 g/dL (ref 30.0–36.0)
MCV: 88.1 fL (ref 80.0–100.0)
Platelets: 138 10*3/uL — ABNORMAL LOW (ref 150–400)
RBC: 4.13 MIL/uL (ref 3.87–5.11)
RDW: 12.3 % (ref 11.5–15.5)
WBC: 6.2 10*3/uL (ref 4.0–10.5)
nRBC: 0 % (ref 0.0–0.2)

## 2023-06-08 LAB — BASIC METABOLIC PANEL WITH GFR
Anion gap: 8 (ref 5–15)
BUN: 17 mg/dL (ref 8–23)
CO2: 23 mmol/L (ref 22–32)
Calcium: 8.1 mg/dL — ABNORMAL LOW (ref 8.9–10.3)
Chloride: 103 mmol/L (ref 98–111)
Creatinine, Ser: 0.77 mg/dL (ref 0.44–1.00)
GFR, Estimated: >60 mL/min (ref >60)
Glucose, Bld: 98 mg/dL (ref 70–99)
Potassium: 3.3 mmol/L — ABNORMAL LOW (ref 3.5–5.1)
Sodium: 134 mmol/L — ABNORMAL LOW (ref 135–145)

## 2023-06-08 LAB — MAGNESIUM: Magnesium: 1.9 mg/dL (ref 1.7–2.4)

## 2023-06-08 LAB — URINE CULTURE: Culture: NO GROWTH

## 2023-06-08 MED ORDER — SODIUM CHLORIDE 0.9 % IV SOLN
1.0000 g | INTRAVENOUS | Status: DC
  Administered 2023-06-08: 1 g via INTRAVENOUS
  Filled 2023-06-08: qty 10

## 2023-06-08 MED ORDER — POTASSIUM CHLORIDE 20 MEQ PO PACK
60.0000 meq | PACK | Freq: Once | ORAL | Status: AC
  Administered 2023-06-08: 60 meq via ORAL
  Filled 2023-06-08: qty 3

## 2023-06-08 MED ORDER — HYDROCHLOROTHIAZIDE 12.5 MG PO TABS
12.5000 mg | ORAL_TABLET | Freq: Every day | ORAL | 0 refills | Status: DC
Start: 2023-06-08 — End: 2023-09-15

## 2023-06-08 MED ORDER — HYDROCHLOROTHIAZIDE 12.5 MG PO TABS
12.5000 mg | ORAL_TABLET | Freq: Every day | ORAL | Status: DC
  Administered 2023-06-08: 12.5 mg via ORAL
  Filled 2023-06-08: qty 1

## 2023-06-08 MED ORDER — HYDRALAZINE HCL 20 MG/ML IJ SOLN
10.0000 mg | INTRAMUSCULAR | Status: DC | PRN
  Filled 2023-06-08: qty 1

## 2023-06-08 NOTE — Progress Notes (Signed)
 Went over discharge instructions w/ pt daughter Korey Arroyo.

## 2023-06-08 NOTE — Plan of Care (Signed)
   Problem: Education: Goal: Knowledge of General Education information will improve Description: Including pain rating scale, medication(s)/side effects and non-pharmacologic comfort measures Outcome: Progressing   Problem: Health Behavior/Discharge Planning: Goal: Ability to manage health-related needs will improve Outcome: Progressing   Problem: Clinical Measurements: Goal: Will remain free from infection Outcome: Progressing

## 2023-06-08 NOTE — Discharge Summary (Signed)
 Physician Discharge Summary  Jackie Phelps:[id number] DOB: 11/08/1931 DOA: 06/06/2023  PCP: Rory Collard, MD  Admit date: 06/06/2023  Discharge date: 06/08/2023  Admitted From:Home  Disposition:  Home  Recommendations for Outpatient Follow-up:  Follow up with PCP in 1-2 weeks Completed course of treatment for UTI with no growth on urine cultures Continue home medications as prior  Home Health: Yes with home PT  Equipment/Devices: None  Discharge Condition:Stable  CODE STATUS: Full  Diet recommendation: Heart Healthy  Brief/Interim Summary: Jackie Phelps is a 89 y.o. female with medical history significant of hypertension, hyperlipidemia, impaired memory/dementia (mild), anxiety/depression, sleep apnea who presents to the emergency department from home via EMS due to generalized weakness.  Patient was unable to provide history due to underlying dementia, history was obtained from EDP and ED medical record.  Per report patient was recently diagnosed with UTI and was started on Macrobid yesterday, but she has been having increased generalized weakness to the extent that she had difficulty in being able to ambulate.  EMS was activated and patient was sent to the ED for further evaluation and management.   Patient was admitted with generalized weakness in the setting of UTI and empirically received IV Rocephin  and urine cultures demonstrated no growth.  PT/OT recommending home health services which have not been arranged.  No other acute events or concerns noted throughout the course of this admission.  Discharge Diagnoses:  Principal Problem:   UTI (urinary tract infection) Active Problems:   Essential hypertension   Mixed hyperlipidemia   Generalized weakness   Hypokalemia   Prolonged QT interval   Dementia without behavioral disturbance (HCC)   Anxiety and depression  Principal discharge diagnosis: Generalized weakness in the setting of UTI.  Discharge  Instructions  Discharge Instructions     Diet - low sodium heart healthy   Complete by: As directed    Increase activity slowly   Complete by: As directed       Allergies as of 06/08/2023       Reactions   Penicillins Hives, Dermatitis, Rash   Patient tolerated Ancef  without any complications.        Medication List     STOP taking these medications    nitrofurantoin (macrocrystal-monohydrate) 100 MG capsule Commonly known as: MACROBID       TAKE these medications    aspirin  EC 81 MG tablet Take 1 tablet by mouth daily.   carvedilol  6.25 MG tablet Commonly known as: COREG  Take 2 tablets (12.5 mg total) by mouth 2 (two) times daily. What changed:  how much to take when to take this   donepezil  10 MG tablet Commonly known as: ARICEPT  Take 10 mg by mouth daily.   escitalopram  5 MG tablet Commonly known as: LEXAPRO  Take 1 tablet (5 mg total) by mouth daily.   hydrochlorothiazide  12.5 MG tablet Commonly known as: HYDRODIURIL  Take 1 tablet (12.5 mg total) by mouth daily. What changed: how much to take   memantine  10 MG tablet Commonly known as: NAMENDA  Take 10 mg by mouth 2 (two) times daily.   simvastatin  20 MG tablet Commonly known as: ZOCOR  Take 1 tablet (20 mg total) by mouth daily.   vitamin B-12 500 MCG tablet Commonly known as: CYANOCOBALAMIN Take 500 mcg by mouth daily.        Follow-up Information     [place]  Follow up.   Why: Will contact you to schedule home health visits. Contact information: [phone]  [address] [place] [address] [phone]         Rory Collard, MD. Schedule an appointment as soon as possible for a visit in 1 week(s).   Specialty: Family Medicine Contact information: [address] [place] [address] [phone]                Allergies  Allergen Reactions   Penicillins Hives, Dermatitis and Rash    Patient tolerated Ancef  without any complications.     Consultations: None   Procedures/Studies: CT Head Wo Contrast Result Date: 06/07/2023 EXAM: CT HEAD WITHOUT 06/26/2022 TECHNIQUE: CT of the head was performed without the administration of intravenous contrast. Automated exposure control, iterative reconstruction, and/or weight based adjustment of the mA/kV was utilized to reduce the radiation dose to as low as reasonably achievable. COMPARISON: 11/13/2022 CLINICAL HISTORY: FINDINGS: BRAIN AND VENTRICLES: There is no acute intracranial hemorrhage, mass effect or midline shift. No abnormal extra-axial fluid collection. The gray-white differentiation is maintained without evidence of an acute infarct. Global cortical atrophy with stable secondary ventricular prominence. Subcortical and periventricular small vessel ischemic changes. ORBITS: The visualized portion of the orbits demonstrate no acute abnormality. SINUSES: The visualized paranasal sinuses and mastoid air cells demonstrate no acute abnormality. SOFT TISSUES AND SKULL: No acute abnormality of the visualized skull or soft tissues. IMPRESSION: 1. No acute intracranial abnormality. 2. Global cortical atrophy with small vessel ischemic changes. Electronically signed by: Zadie Herter MD 06/07/2023 12:19 AM EDT RP Workstation: [id number]   CT Renal Stone Study Result Date: 06/06/2023 EXAM: CT UROGRAM 06/06/2023 10:19:27 PM TECHNIQUE: CT of the abdomen and pelvis was performed before and after the administration of intravenous contrast as per CT urogram protocol. Multiplanar reformatted images as well as MIP urogram images are provided for review. Automated exposure control, iterative reconstruction, and/or weight based adjustment of the mA/kV was utilized to reduce the radiation dose to as low as reasonably achievable. COMPARISON: 06/09/2016 CLINICAL HISTORY: Abdominal/flank pain, stone suspected. Weakness, Pt arrives by [facility] from home where she lives with her daughter who is her caregiver. Pt  has dementia. Pt has been having increased generalized weakness and she was dx with a UTI and was started on macrobid yesterday. Temp 99.4 orally FINDINGS: LOWER CHEST: Trace bilateral pleural effusions with mild bilateral lower lobe atelectasis. LIVER: The liver is unremarkable. GALLBLADDER AND BILE DUCTS: Gallbladder is unremarkable. No biliary ductal dilatation. SPLEEN: No acute abnormality. PANCREAS: No acute abnormality. ADRENAL GLANDS: No acute abnormality. KIDNEYS, URETERS AND BLADDER: 3.3 cm medial right lower pole simple cyst (image 35), benign (Bosniak 1). Per consensus, no follow-up is needed for simple Bosniak type 1 and 2 renal cysts, unless the patient has a malignancy history or risk factors. No stones in the kidneys or ureters. No evidence of hydronephrosis. No evidence of perinephric or periureteral stranding. Urinary bladder is unremarkable. GI AND BOWEL: Stomach demonstrates no acute abnormality. There is no evidence of bowel obstruction. Normal appendix (image 56). Left colonic diverticulosis, without evidence of diverticulitis. PERITONEUM AND RETROPERITONEUM: No evidence of ascites. No free air. VASCULATURE: Atherosclerotic calcifications of the abdominal aorta and branch vessels. LYMPH NODES: No evidence of lymphadenopathy. REPRODUCTIVE ORGANS: Calcified uterine fibroids. BONES AND SOFT TISSUES: Bilateral hip arthroplasties without evidence of complication. No acute osseous abnormality. No focal soft tissue abnormality. IMPRESSION: 1. No acute findings. 2. Left colonic diverticulosis without evidence of diverticulitis. 3. Additional ancillary findings as above. Electronically signed by: Zadie Herter MD 06/06/2023 10:24 PM EDT RP Workstation: [id number]  Discharge Exam: Vitals:   06/08/23 0708 06/08/23 0844  BP: (!) 174/75 (!) 146/74  Pulse: 65 60  Resp:    Temp:    SpO2: 91% 98%   Vitals:   06/07/23 2027 06/08/23 0410 06/08/23 0708 06/08/23 0844  BP: (!) 151/70 (!) 195/76  (!) 174/75 (!) 146/74  Pulse: 74 (!) 59 65 60  Resp: 20 18    Temp: 98.8 F (37.1 C) 98.5 F (36.9 C)    TempSrc: Oral Oral    SpO2: 97% 98% 91% 98%  Weight:      Height:        General: Pt is alert, awake, not in acute distress Cardiovascular: RRR, S1/S2 +, no rubs, no gallops Respiratory: CTA bilaterally, no wheezing, no rhonchi Abdominal: Soft, NT, ND, bowel sounds + Extremities: no edema, no cyanosis    The results of significant diagnostics from this hospitalization (including imaging, microbiology, ancillary and laboratory) are listed below for reference.     Microbiology: Recent Results (from the past 240 hours)  Urine Culture     Status: None   Collection Time: 06/06/23  6:38 PM   Specimen: Urine, Clean Catch  Result Value Ref Range Status   Specimen Description   Final    URINE, CLEAN CATCH Performed at [facility], [address] [place], [place] [address]    Special Requests   Final    NONE Performed at [facility], [address], [place], [place] [address]    Culture   Final    NO GROWTH Performed at [facility], [address], [place], [place] [address]    Report Status 06/08/2023 FINAL  Final     Labs: BNP (last 3 results) No results for input(s): "BNP" in the last 8760 hours. Basic Metabolic Panel: Recent Labs  Lab 06/06/23 1808 06/07/23 0743 06/08/23 0304  NA 135 136 134*  K 3.1* 3.2* 3.3*  CL 103 101 103  CO2 25 25 23   GLUCOSE 94 114* 98  BUN 15 13 17   CREATININE 0.91 0.84 0.77  CALCIUM 8.9 8.2* 8.1*  MG  --  2.1 1.9  PHOS  --  3.0  --    Liver Function Tests: Recent Labs  Lab 06/06/23 1808 06/07/23 0743  AST 15 14*  ALT 10 9  ALKPHOS 63 52  BILITOT 0.9 0.8  PROT 6.7 5.8*  ALBUMIN  3.8 3.2*   No results for input(s): "LIPASE", "AMYLASE" in the last 168 hours. No results for input(s): "AMMONIA" in the last 168 hours. CBC: Recent Labs  Lab 06/06/23 1808 06/07/23 0743 06/08/23 0304  WBC 6.1 7.2 6.2  NEUTROABS  5.2  --   --   HGB 13.8 13.0 11.8*  HCT 41.3 38.2 36.4  MCV 89.2 88.4 88.1  PLT 175 147* 138*   Cardiac Enzymes: No results for input(s): "CKTOTAL", "CKMB", "CKMBINDEX", "TROPONINI" in the last 168 hours. BNP: Invalid input(s): "POCBNP" CBG: No results for input(s): "GLUCAP" in the last 168 hours. D-Dimer No results for input(s): "DDIMER" in the last 72 hours. Hgb A1c No results for input(s): "HGBA1C" in the last 72 hours. Lipid Profile No results for input(s): "CHOL", "HDL", "LDLCALC", "TRIG", "CHOLHDL", "LDLDIRECT" in the last 72 hours. Thyroid  function studies No results for input(s): "TSH", "T4TOTAL", "T3FREE", "THYROIDAB" in the last 72 hours.  Invalid input(s): "FREET3" Anemia work up No results for input(s): "VITAMINB12", "FOLATE", "FERRITIN", "TIBC", "IRON", "RETICCTPCT" in the last 72 hours. Urinalysis    Component Value Date/Time  COLORURINE YELLOW 06/06/2023 1838   APPEARANCEUR CLEAR 06/06/2023 1838   LABSPEC 1.008 06/06/2023 1838   PHURINE 6.0 06/06/2023 1838   GLUCOSEU NEGATIVE 06/06/2023 1838   HGBUR LARGE (A) 06/06/2023 1838   BILIRUBINUR NEGATIVE 06/06/2023 1838   BILIRUBINUR neg 05/25/2019 1500   KETONESUR NEGATIVE 06/06/2023 1838   PROTEINUR NEGATIVE 06/06/2023 1838   UROBILINOGEN negative (A) 05/25/2019 1500   UROBILINOGEN 0.2 01/08/2014 1506   NITRITE NEGATIVE 06/06/2023 1838   LEUKOCYTESUR NEGATIVE 06/06/2023 1838   Sepsis Labs Recent Labs  Lab 06/06/23 1808 06/07/23 0743 06/08/23 0304  WBC 6.1 7.2 6.2   Microbiology Recent Results (from the past 240 hours)  Urine Culture     Status: None   Collection Time: 06/06/23  6:38 PM   Specimen: Urine, Clean Catch  Result Value Ref Range Status   Specimen Description   Final    URINE, CLEAN CATCH Performed at [facility], [address] [place], [place] [address]    Special Requests   Final    NONE Performed at [facility], [address] [place], [place] [address]    Culture   Final     NO GROWTH Performed at [facility], [address], [place], [place] [address]    Report Status 06/08/2023 FINAL  Final     Time coordinating discharge: 35 minutes  SIGNED:   Cornelius Dill, DO [facility] 06/08/2023, 12:14 PM  If 7PM-7AM, please contact night-coverage [email or web address]

## 2023-06-08 NOTE — ED Provider Notes (Signed)
 [facility] MEDICAL SURGICAL UNIT Provider Note   CSN: [id number] Arrival date & time: 06/06/23  1803     History  No chief complaint on file.   Jackie Phelps is a 89 y.o. female.  Patient has a history of hypertension, dementia and frequent UTIs.  She presents with significant weakness and has been started on Macrobid  The history is provided by a relative. No language interpreter was used.  Weakness Severity:  Moderate Timing:  Constant Progression:  Worsening Chronicity:  Recurrent Context: not alcohol use   Relieved by:  Nothing Worsened by:  Nothing Associated symptoms: no abdominal pain, no chest pain, no cough, no diarrhea, no frequency, no headaches and no seizures        Home Medications Prior to Admission medications   Medication Sig Start Date End Date Taking? Authorizing Provider  aspirin  EC 81 MG tablet Take 1 tablet by mouth daily.   Yes [provider]  carvedilol  (COREG ) 6.25 MG tablet Take 2 tablets (12.5 mg total) by mouth 2 (two) times daily. Patient taking differently: Take 6.25 mg by mouth 2 (two) times daily with a meal. 08/13/20  Yes Burnette, Leory Rands, PA-C  donepezil  (ARICEPT ) 10 MG tablet Take 10 mg by mouth daily. 06/18/16  Yes [provider]  escitalopram  (LEXAPRO ) 5 MG tablet Take 1 tablet (5 mg total) by mouth daily. 08/13/20  Yes Angelia Kelp, PA-C  memantine  (NAMENDA ) 10 MG tablet Take 10 mg by mouth 2 (two) times daily.  06/18/16  Yes [provider]  nitrofurantoin, macrocrystal-monohydrate, (MACROBID) 100 MG capsule Take 100 mg by mouth 2 (two) times daily. 06/05/23 06/10/23 Yes [provider]  simvastatin  (ZOCOR ) 20 MG tablet Take 1 tablet (20 mg total) by mouth daily. 08/13/20  Yes Angelia Kelp, PA-C  vitamin B-12 (CYANOCOBALAMIN) 500 MCG tablet Take 500 mcg by mouth daily.   Yes [provider]  hydrochlorothiazide  (HYDRODIURIL ) 12.5 MG tablet Take 1 tablet (12.5 mg total) by  mouth daily. 06/08/23 09/06/23  Doreene Gammon D, DO      Allergies    Penicillins    Review of Systems   Review of Systems  Constitutional:  Negative for appetite change and fatigue.  HENT:  Negative for congestion, ear discharge and sinus pressure.   Eyes:  Negative for discharge.  Respiratory:  Negative for cough.   Cardiovascular:  Negative for chest pain.  Gastrointestinal:  Negative for abdominal pain and diarrhea.  Genitourinary:  Negative for frequency and hematuria.  Musculoskeletal:  Negative for back pain.  Skin:  Negative for rash.  Neurological:  Positive for weakness. Negative for seizures and headaches.  Psychiatric/Behavioral:  Negative for hallucinations.     Physical Exam Updated Vital Signs BP (!) 146/74 (BP Location: Right Arm)   Pulse 60   Temp 98.5 F (36.9 C) (Oral)   Resp 18   Ht 5\' 2"  (1.575 m)   Wt 59.9 kg   SpO2 98%   BMI 24.15 kg/m  Physical Exam Vitals and nursing note reviewed.  Constitutional:      Appearance: She is well-developed.     Comments: General weakness  HENT:     Head: Normocephalic.     Mouth/Throat:     Mouth: Mucous membranes are moist.  Eyes:     General: No scleral icterus.    Conjunctiva/sclera: Conjunctivae normal.  Neck:     Thyroid : No thyromegaly.  Cardiovascular:     Rate and Rhythm: Normal rate and regular  rhythm.     Heart sounds: No murmur heard.    No friction rub. No gallop.  Pulmonary:     Breath sounds: No stridor. No wheezing or rales.  Chest:     Chest wall: No tenderness.  Abdominal:     General: There is no distension.     Tenderness: There is no abdominal tenderness. There is no rebound.  Musculoskeletal:        General: Normal range of motion.     Cervical back: Neck supple.  Lymphadenopathy:     Cervical: No cervical adenopathy.  Skin:    Findings: No erythema or rash.  Neurological:     Mental Status: She is alert and oriented to person, place, and time.     Motor: No abnormal muscle  tone.     Coordination: Coordination normal.  Psychiatric:        Behavior: Behavior normal.     ED Results / Procedures / Treatments   Labs (all labs ordered are listed, but only abnormal results are displayed) Labs Reviewed  CBC WITH DIFFERENTIAL/PLATELET - Abnormal; Notable for the following components:      Result Value   Lymphs Abs 0.4 (*)    All other components within normal limits  COMPREHENSIVE METABOLIC PANEL WITH GFR - Abnormal; Notable for the following components:   Potassium 3.1 (*)    All other components within normal limits  URINALYSIS, ROUTINE W REFLEX MICROSCOPIC - Abnormal; Notable for the following components:   Hgb urine dipstick LARGE (*)    All other components within normal limits  CBC - Abnormal; Notable for the following components:   Platelets 147 (*)    All other components within normal limits  COMPREHENSIVE METABOLIC PANEL WITH GFR - Abnormal; Notable for the following components:   Potassium 3.2 (*)    Glucose, Bld 114 (*)    Calcium 8.2 (*)    Total Protein 5.8 (*)    Albumin  3.2 (*)    AST 14 (*)    All other components within normal limits  CBC - Abnormal; Notable for the following components:   Hemoglobin 11.8 (*)    Platelets 138 (*)    All other components within normal limits  BASIC METABOLIC PANEL WITH GFR - Abnormal; Notable for the following components:   Sodium 134 (*)    Potassium 3.3 (*)    Calcium 8.1 (*)    All other components within normal limits  URINE CULTURE  MAGNESIUM   PHOSPHORUS  MAGNESIUM     EKG EKG Interpretation Date/Time:  Saturday Jun 06 2023 18:20:47 EDT Ventricular Rate:  82 PR Interval:  177 QRS Duration:  144 QT Interval:  441 QTC Calculation: 516 R Axis:   -22  Text Interpretation: Sinus rhythm Left bundle branch block No significant change since last tracing Confirmed by Ballard Bongo [phone]) on 06/07/2023 12:28:44 AM  Radiology CT Head Wo Contrast Result Date: 06/07/2023 EXAM: CT HEAD  WITHOUT 06/26/2022 TECHNIQUE: CT of the head was performed without the administration of intravenous contrast. Automated exposure control, iterative reconstruction, and/or weight based adjustment of the mA/kV was utilized to reduce the radiation dose to as low as reasonably achievable. COMPARISON: 11/13/2022 CLINICAL HISTORY: FINDINGS: BRAIN AND VENTRICLES: There is no acute intracranial hemorrhage, mass effect or midline shift. No abnormal extra-axial fluid collection. The gray-white differentiation is maintained without evidence of an acute infarct. Global cortical atrophy with stable secondary ventricular prominence. Subcortical and periventricular small vessel ischemic changes. ORBITS: The visualized portion  of the orbits demonstrate no acute abnormality. SINUSES: The visualized paranasal sinuses and mastoid air cells demonstrate no acute abnormality. SOFT TISSUES AND SKULL: No acute abnormality of the visualized skull or soft tissues. IMPRESSION: 1. No acute intracranial abnormality. 2. Global cortical atrophy with small vessel ischemic changes. Electronically signed by: Zadie Herter MD 06/07/2023 12:19 AM EDT RP Workstation: [id number]   CT Renal Stone Study Result Date: 06/06/2023 EXAM: CT UROGRAM 06/06/2023 10:19:27 PM TECHNIQUE: CT of the abdomen and pelvis was performed before and after the administration of intravenous contrast as per CT urogram protocol. Multiplanar reformatted images as well as MIP urogram images are provided for review. Automated exposure control, iterative reconstruction, and/or weight based adjustment of the mA/kV was utilized to reduce the radiation dose to as low as reasonably achievable. COMPARISON: 06/09/2016 CLINICAL HISTORY: Abdominal/flank pain, stone suspected. Weakness, Pt arrives by [facility] from home where she lives with her daughter who is her caregiver. Pt has dementia. Pt has been having increased generalized weakness and she was dx with a UTI and was started on  macrobid yesterday. Temp 99.4 orally FINDINGS: LOWER CHEST: Trace bilateral pleural effusions with mild bilateral lower lobe atelectasis. LIVER: The liver is unremarkable. GALLBLADDER AND BILE DUCTS: Gallbladder is unremarkable. No biliary ductal dilatation. SPLEEN: No acute abnormality. PANCREAS: No acute abnormality. ADRENAL GLANDS: No acute abnormality. KIDNEYS, URETERS AND BLADDER: 3.3 cm medial right lower pole simple cyst (image 35), benign (Bosniak 1). Per consensus, no follow-up is needed for simple Bosniak type 1 and 2 renal cysts, unless the patient has a malignancy history or risk factors. No stones in the kidneys or ureters. No evidence of hydronephrosis. No evidence of perinephric or periureteral stranding. Urinary bladder is unremarkable. GI AND BOWEL: Stomach demonstrates no acute abnormality. There is no evidence of bowel obstruction. Normal appendix (image 56). Left colonic diverticulosis, without evidence of diverticulitis. PERITONEUM AND RETROPERITONEUM: No evidence of ascites. No free air. VASCULATURE: Atherosclerotic calcifications of the abdominal aorta and branch vessels. LYMPH NODES: No evidence of lymphadenopathy. REPRODUCTIVE ORGANS: Calcified uterine fibroids. BONES AND SOFT TISSUES: Bilateral hip arthroplasties without evidence of complication. No acute osseous abnormality. No focal soft tissue abnormality. IMPRESSION: 1. No acute findings. 2. Left colonic diverticulosis without evidence of diverticulitis. 3. Additional ancillary findings as above. Electronically signed by: Zadie Herter MD 06/06/2023 10:24 PM EDT RP Workstation: [id number]    Procedures Procedures    Medications Ordered in ED Medications  enoxaparin  (LOVENOX ) injection 40 mg (40 mg Subcutaneous Given 06/08/23 0847)  acetaminophen  (TYLENOL ) tablet 650 mg (has no administration in time range)    Or  acetaminophen  (TYLENOL ) suppository 650 mg (has no administration in time range)  prochlorperazine (COMPAZINE)  injection 10 mg (has no administration in time range)  simvastatin  (ZOCOR ) tablet 20 mg (20 mg Oral Given 06/07/23 2139)  donepezil  (ARICEPT ) tablet 10 mg (10 mg Oral Given 06/07/23 2139)  memantine  (NAMENDA ) tablet 10 mg (10 mg Oral Given 06/08/23 0847)  escitalopram  (LEXAPRO ) tablet 5 mg (5 mg Oral Given 06/08/23 0846)  carvedilol  (COREG ) tablet 6.25 mg (6.25 mg Oral Given 06/08/23 0716)  hydrochlorothiazide  (HYDRODIURIL ) tablet 12.5 mg (0 mg Oral Duplicate 06/08/23 1000)  hydrALAZINE  (APRESOLINE ) injection 10 mg (has no administration in time range)  cefTRIAXone  (ROCEPHIN ) 1 g in sodium chloride  0.9 % 100 mL IVPB (has no administration in time range)  sodium chloride  0.9 % bolus 1,000 mL (0 mLs Intravenous Stopped 06/07/23 0000)  cefTRIAXone  (ROCEPHIN ) 2 g in sodium chloride  0.9 % 100 mL IVPB (  0 g Intravenous Stopped 06/07/23 0000)  potassium chloride  (KLOR-CON ) packet 60 mEq (60 mEq Oral Given 06/07/23 0248)  0.9 % NaCl with KCl 20 mEq/ L  infusion ( Intravenous New Bag/Given 06/07/23 0357)  magnesium  sulfate IVPB 1 g 100 mL (1 g Intravenous New Bag/Given 06/07/23 0246)  potassium chloride  (KLOR-CON ) packet 60 mEq (60 mEq Oral Given 06/07/23 1156)  potassium chloride  (KLOR-CON ) packet 60 mEq (60 mEq Oral Given 06/08/23 0716)    ED Course/ Medical Decision Making/ A&P                                 Medical Decision Making Amount and/or Complexity of Data Reviewed Labs: ordered. Radiology: ordered. ECG/medicine tests: ordered.  Risk Prescription drug management. Decision regarding hospitalization.   Patient with general weakness and hematuria with possible UTI and hypokalemia.  She will be admitted to the medicine        Final Clinical Impression(s) / ED Diagnoses Final diagnoses:  Acute cystitis with hematuria    Rx / DC Orders ED Discharge Orders          Ordered    hydrochlorothiazide  (HYDRODIURIL ) 12.5 MG tablet  Daily       Note to Pharmacy: This prescription was filled on 03/20/2020.  Any refills authorized will be placed on file.   06/08/23 1213    Increase activity slowly        06/08/23 1213    Diet - low sodium heart healthy        06/08/23 1213              Cheyenne Cotta, MD 06/08/23 1249

## 2023-06-08 NOTE — TOC Initial Note (Signed)
 Transition of Care [facility]) - Initial/Assessment Note    Patient Details  Name: Jackie Phelps MRN: [id number] Date of Birth: 1931/04/16  Transition of Care [facility] [place] [facility]) CM/SW Contact:    Ander Katos, LCSW Phone Number: 06/08/2023, 11:39 AM  Clinical Narrative: Pt admitted due to UTI. LCSW met with pt and daughter at bedside. Pt lives with daughter who assists with ADLs as needed and provides transportation to appointments. PT evaluated pt and recommend HHPT. Discussed with pt and daughter who requests [facility]. Referred and accepted by Artavia with [facility]. HHPT order requested. No other needs reported at this time. Artavia aware of anticipated d/c today.               Expected Discharge Plan: Home w Home Health Services Barriers to Discharge: Barriers Resolved   Patient Goals and CMS Choice Patient states their goals for this hospitalization and ongoing recovery are:: return home   Choice offered to / list presented to : Patient, Adult Children [place] ownership interest in [facility].provided to::  (n/a)    Expected Discharge Plan and Services In-house Referral: Clinical Social Work   Post Acute Care Choice: Home Health Living arrangements for the past 2 months: Single Family Home                           HH Arranged: PT HH [facility] Date HH Agency Contacted: 06/08/23 Time HH Agency Contacted: 1138 Representative spoke with at [facility] Agency: Renetta Carter  Prior Living Arrangements/Services Living arrangements for the past 2 months: Single Family Home Lives with:: Adult Children Patient language and need for interpreter reviewed:: Yes Do you feel safe going back to the place where you live?: Yes      Need for Family Participation in Patient Care: Yes (Comment) Care giver support system in place?: Yes (comment) Current home services: DME ([facility], [facility]) Criminal Activity/Legal Involvement Pertinent to Current Situation/Hospitalization: No  - Comment as needed  Activities of Daily Living      Permission Sought/Granted                  Emotional Assessment     Affect (typically observed): Appropriate Orientation: : Oriented to Self, Oriented to Place Alcohol / Substance Use: Not Applicable Psych Involvement: No (comment)  Admission diagnosis:  UTI (urinary tract infection) [N39.0] Acute cystitis with hematuria [N30.01] Patient Active Problem List   Diagnosis Date Noted   UTI (urinary tract infection) 06/07/2023   Generalized weakness 06/07/2023   Hypokalemia 06/07/2023   Prolonged QT interval 06/07/2023   Dementia without behavioral disturbance (HCC) 06/07/2023   Anxiety and depression 06/07/2023   Other fracture of right femur, initial encounter for closed fracture (HCC) 01/27/2020   Malignant neoplasm of urinary bladder (HCC) 05/25/2019   Vitamin D  deficiency disease 10/26/2018   Viral warts 10/26/2018   Mixed hyperlipidemia 10/26/2018   Iron deficiency anemia 01/22/2014   Anxiety state 01/16/2014   Cough 01/16/2014   Left displaced femoral neck fracture (HCC) 01/08/2014   Chronic constipation 02/03/2011   Essential hypertension 02/03/2011   PCP:  Rory Collard, MD Pharmacy:   [facility] [place] - [address] [place] [address] Phone: [phone] Fax: [phone]     Social Drivers of Health (SDOH) Social History: SDOH Screenings   Food Insecurity: Patient Declined (06/07/2023)  Housing: Patient Declined (06/07/2023)  Transportation Needs: Patient Declined (06/07/2023)  Utilities: Patient  Declined (06/07/2023)  Depression (PHQ2-9): Low Risk  (12/19/2019)  Financial Resource Strain: Low Risk  (06/03/2023)   Received from [facility]  Social Connections: Patient Declined (06/07/2023)  Tobacco Use: Medium Risk (06/06/2023)   SDOH Interventions:     Readmission Risk Interventions    06/08/2023   11:36 AM  Readmission Risk Prevention  Plan  Transportation Screening Complete  HRI or Home Care Consult Complete  Social Work Consult for Recovery Care Planning/Counseling Complete  Palliative Care Screening Not Applicable  Medication Review Oceanographer) Complete

## 2023-06-08 NOTE — Evaluation (Signed)
 Physical Therapy Evaluation Patient Details Name: Jackie Phelps MRN: [id number] DOB: 11-12-1931 Today's Date: 06/08/2023  History of Present Illness  Jackie Phelps is a 89 y.o. female with medical history significant of hypertension, hyperlipidemia, impaired memory/dementia (mild), anxiety/depression, sleep apnea who presents to the emergency department from home via EMS due to generalized weakness.  Patient was unable to provide history due to underlying dementia, history was obtained from EDP and ED medical record.  Per report patient was recently diagnosed with UTI and was started on Macrobid yesterday, but she has been having increased generalized weakness to the extent that she had difficulty in being able to ambulate.  EMS was activated and patient was sent to the ED for further evaluation and management.   Clinical Impression  Patient demonstrates good return for bed mobility, transfers, has to occasionally lean on walls, side rails during ambulation without AD, no loss of balance and limited mostly due to fatigue. Plan:  Patient discharged from physical therapy to care of nursing for ambulation daily as tolerated for length of stay.         If plan is discharge home, recommend the following: Help with stairs or ramp for entrance;Assistance with cooking/housework;A little help with walking and/or transfers;A little help with bathing/dressing/bathroom   Can travel by private vehicle        Equipment Recommendations None recommended by PT  Recommendations for Other Services       Functional Status Assessment Patient has had a recent decline in their functional status and demonstrates the ability to make significant improvements in function in a reasonable and predictable amount of time.     Precautions / Restrictions Precautions Precautions: Fall Restrictions Weight Bearing Restrictions Per Provider Order: No      Mobility  Bed Mobility Overal bed mobility: Modified  Independent                  Transfers Overall transfer level: Modified independent                      Ambulation/Gait Ambulation/Gait assistance: Modified independent (Device/Increase time), Supervision Gait Distance (Feet): 85 Feet Assistive device: None Gait Pattern/deviations: Step-through pattern, Decreased step length - right, Decreased stride length Gait velocity: decreased     General Gait Details: slightly labored movement with occasional leaning on walls, side rails without loss of balance, limited mostly due to fatigue  Stairs            Wheelchair Mobility     Tilt Bed    Modified Rankin (Stroke Patients Only)       Balance Overall balance assessment: Mild deficits observed, not formally tested                                           Pertinent Vitals/Pain Pain Assessment Pain Assessment: No/denies pain    Home Living Family/patient expects to be discharged to:: Private residence Living Arrangements: Spouse/significant other Available Help at Discharge: Family;Available 24 hours/day Type of Home: House Home Access: Stairs to enter   Entrance Stairs-Number of Steps: 2 Alternate Level Stairs-Number of Steps: 20 Home Layout: Multi-level;Bed/bath upstairs Home Equipment: Agricultural consultant (2 wheels);Tub bench      Prior Function Prior Level of Function : Needs assist       Physical Assist : Mobility (physical);ADLs (physical) Mobility (physical): Transfers;Bed mobility;Stairs;Gait   Mobility Comments:  household and short distanced community ambulation without AD ADLs Comments: Assisted by family     Extremity/Trunk Assessment   Upper Extremity Assessment Upper Extremity Assessment: Overall WFL for tasks assessed    Lower Extremity Assessment Lower Extremity Assessment: Generalized weakness    Cervical / Trunk Assessment Cervical / Trunk Assessment: Normal  Communication    Communication Communication: No apparent difficulties    Cognition Arousal: Alert Behavior During Therapy: WFL for tasks assessed/performed   PT - Cognitive impairments: History of cognitive impairments                         Following commands: Intact       Cueing Cueing Techniques: Verbal cues, Tactile cues     General Comments      Exercises     Assessment/Plan    PT Assessment All further PT needs can be met in the next venue of care  PT Problem List Decreased activity tolerance;Decreased balance;Decreased strength;Decreased mobility       PT Treatment Interventions      PT Goals (Current goals can be found in the Care Plan section)  Acute Rehab PT Goals Patient Stated Goal: return home with family to assist PT Goal Formulation: With patient Time For Goal Achievement: 06/08/23 Potential to Achieve Goals: Good    Frequency       Co-evaluation               AM-PAC PT "6 Clicks" Mobility  Outcome Measure Help needed turning from your back to your side while in a flat bed without using bedrails?: None Help needed moving from lying on your back to sitting on the side of a flat bed without using bedrails?: None Help needed moving to and from a bed to a chair (including a wheelchair)?: None Help needed standing up from a chair using your arms (e.g., wheelchair or bedside chair)?: A Little Help needed to walk in hospital room?: A Little Help needed climbing 3-5 steps with a railing? : A Little 6 Click Score: 21    End of Session   Activity Tolerance: Patient tolerated treatment well;Patient limited by fatigue Patient left: in chair;with call bell/phone within reach;with chair alarm set Nurse Communication: Mobility status PT Visit Diagnosis: Unsteadiness on feet (R26.81);Other abnormalities of gait and mobility (R26.89);Muscle weakness (generalized) (M62.81)    Time: 5784-6962 PT Time Calculation (min) (ACUTE ONLY): 21 min   Charges:   PT  Evaluation $PT Eval Moderate Complexity: 1 Mod PT Treatments $Therapeutic Activity: 8-22 mins PT General Charges $$ ACUTE PT VISIT: 1 Visit         1:38 PM, 06/08/23 Walton Guppy, MPT Physical Therapist with [facility] [phone] office [phone] mobile phone

## 2023-06-09 ENCOUNTER — Other Ambulatory Visit: Payer: Self-pay | Admitting: Neurology

## 2023-06-09 DIAGNOSIS — G3184 Mild cognitive impairment, so stated: Secondary | ICD-10-CM

## 2023-06-10 ENCOUNTER — Ambulatory Visit (INDEPENDENT_AMBULATORY_CARE_PROVIDER_SITE_OTHER): Payer: Medicare Other | Admitting: Otolaryngology

## 2023-06-13 ENCOUNTER — Ambulatory Visit (HOSPITAL_COMMUNITY)

## 2023-06-14 ENCOUNTER — Ambulatory Visit
Admission: RE | Admit: 2023-06-14 | Discharge: 2023-06-14 | Disposition: A | Discharge status: Home / Self Care | Source: Ambulatory Visit | Attending: Neurology | Admitting: Neurology

## 2023-06-14 DIAGNOSIS — G3184 Mild cognitive impairment, so stated: Secondary | ICD-10-CM | POA: Diagnosis present

## 2023-07-09 NOTE — Progress Notes (Signed)
 Ref Provider: Glover Alm Hail* PCP: Glover Alm Hail, MD  Assessment Jackie Plan:   In most patients we give written parts of assessment Jackie plan to patient under Patient Instructions/After Visit Summary. So some parts are directed to patient.  Dear Jackie Phelps, It was our pleasure to participate in your care in person. We have typed up brief summary of what we discussed. Assessment & Plan Mild to Moderate Mixed Dementia (Alzheimer's Jackie vascular)  Mild to moderate mixed dementia with Alzheimer's Jackie vascular components. Elevated PTAL Jackie reduced beta amyloid 42/40 ratio suggest Alzheimer's pathology. MRI shows moderate white matter microvascular ischemic Jackie metabolic changes. Cognitive changes exacerbated by UTIs. - Continue donepezil  Jackie memantine . - Maintain social activities Jackie physical exercise.  We reviewed MRI brain with Jackie Phelps done on 06/14/2023, which showed  IMPRESSION:  Age-related changes without acute intracranial pathology. Mild to moderate white matter disease suggesting chronic small vessel ischemic change. Mild ventriculomegaly which I believe is proportional to the degree of volume  loss rather than mild normal pressure hydrocephalus. This is relatively stable  from CT scan 2023.   Gait impairment Gait impairment with shuffling, possibly related to neurodegeneration, white matter microvascular ischemic Jackie metabolic changes. Recent Physical therapy was beneficial. - Continue physical therapy exercises at home  - Encourage regular walking with rest as needed.  Urinary incontinence Urinary incontinence with increased risk of UTIs, exacerbating cognitive symptoms. Emphasized hygiene Jackie hydration. - Encourage hydration Jackie proper hygiene. - Advise against baths; recommend showers instead.  UTI UTI in May led to delirium Jackie increased gait impairment. Treated with antibiotics. - Monitor for early signs of UTI. - Ensure prompt treatment of any  future UTIs.  4 months with Dr. Jannett Fairly   - Reviewed vitamin B12 Jackie thyroid  function We will order labs to check  TSH, Vit B12, Vit B1, Vit D, folate.  Phosphorylated Tau 217 (p-Tau 217), plasma ([facility] number [id number]) Beta Amyloid 42/40 ratio, Plasma ([id number], [facility] number [id number])    Return in about 4 months (around 11/09/2023) for with Dr. Fairly. This note has been created using automated tools Jackie reviewed for accuracy by ELIAS GREGORY RODRIGUEZ. I spent a total of 34 minutes in both face-to-face Jackie non-face-to-face activities, excluding procedures performed, for this visit on the date of this encounter. Interim History date 07/09/2023   Jackie Phelps is a 89 y.o. female here for treatment Jackie evaluation of Mild to moderate dementia, accompanied by loved one.   Jackie Phelps last visit was on 06/03/2023  Per loved one, patient had a UTI beck in May Jackie had a fall due to leg weakness. She did not sustain any serious injuries Jackie she has been discharged from physical therapy 07/08/2023. Patient states since has been doing good since her previous appointment. Per loved one, patients memory has been stable since being treated for the UTI, she no longer has any delirium.   Per loved one, there are concerns for patient walking long distances, as she has been shuffling her feet.   We reviewed MRI brain with Jackie Phelps done on 06/14/2023, which showed  IMPRESSION:  Age-related changes without acute intracranial pathology. Mild to moderate white matter disease suggesting chronic small vessel ischemic change. Mild ventriculomegaly which I believe is proportional to the degree of volume  loss rather than mild normal pressure hydrocephalus. This is relatively stable  from CT scan 2023.   - Reviewed vitamin B12 Jackie thyroid  function We will order labs to check  TSH, Vit  B12, Vit B1, Vit D, folate.  Phosphorylated Tau 217 (p-Tau 217), plasma ([facility] number [id number]) Beta Amyloid 42/40 ratio, Plasma  ([id number], [facility] number [id number])  she has an elevation in p-Tau 217 Jackie reduced amyloid beta 42/40 ratio, this can be seen in patients with underlying neurodegenerative disease, suggestive of Alzheimer's Disease pathology but not diagnostic of it. We will discuss more at next visit.     The rest of the labs are ok with minor variations.  History Jackie Present Illness:   Ms. Jackie Phelps is a right handed 89 y.o. female  here for evaluation of Mild to moderate dementia , referred by Glover Alm Hail*.  Patient is presenting for changes in her memory  History of Present Illness Jackie Phelps is an 89 year old female with cognitive decline Jackie gait impairment who presents for evaluation. She is accompanied by her daughter, who is her primary caregiver. She was previously followed by Dr. Myrtis, Oddis RODES.  She has experienced cognitive decline over the past 11 years, marked by poor memory of recent events, difficulty following conversations Jackie instructions, repeatedly asking the same questions, challenges in learning new information, Jackie difficulty understanding television programs or news. She is unable to perform activities of daily living without assistance Jackie exhibits low motivation Jackie irritability. Sundowning occurs in the evenings. A CT scan of the head in November 2024 Jackie May 2024 showed atrophy Jackie microvascular ischemic changes. She has a positive family history of dementia Jackie experienced a major life event with the passing of her husband in August 2024.  Her gait impairment has progressively worsened, impacting her ability to exercise, drive, attend social events, Jackie communicate effectively. She stopped driving in 7983 after forgetting how to enter a car. Her daughter assists with managing finances Jackie medications. No recent formal physical therapy has been undertaken, Jackie there is a history of a fall in January.  She has a history of hearing impairment, which may contribute to  communication difficulties Jackie cognitive symptoms. She requires assistance with bathing Jackie hygiene Jackie wears thin pull-up underwear due to difficulty controlling her bowel Jackie bladder. She can be left alone for short periods.  Her diet is limited, with a preference for sweets Jackie a reduced appetite over the past two years. She does not consume large amounts of food Jackie has difficulty maintaining hydration, preferring Gatorade Jackie Pedialyte over water . She enjoys fruits like grapes Jackie watermelon.   WILLINE Phelps is a 89 year old female with mild to moderate dementia who presents for follow-up. She is accompanied by her daughter, who is her primary caregiver.  She has experienced a gradual onset of dementia over the past eleven years, with memory issues that fluctuate. Her gait has changed to a shuffling pattern, particularly in certain environments, Jackie she struggles with longer distances. These changes were initially noted after a urinary tract infection in May, which led to delirium Jackie hospitalization.  Her medical history includes elevated PTAL, a reduced beta amyloid 42/40 ratio, Jackie an MRI showing mild ventricular enlargement proportional to volume loss, cortical atrophy, Jackie chronic small vessel ischemic changes. She experiences gait impairment Jackie hearing impairment. Physical therapy has been beneficial, Jackie she remains mobile with assistance.  Current medications include Namenda  10 mg, Aricept , aspirin , carvedilol , vitamin B12, donepezil , Lexapro , hydrochlorothiazide , Jackie potassium supplements. Her daughter ensures she maintains hydration Jackie hygiene to prevent further UTIs.  I reviewed labs, imaging, Jackie notes in [place], [place], Jackie from outside providers, if available.  11/13/2022 Ct Head without  IMPRESSION:  1. No acute intracranial process.  2. Atrophy with chronic microvascular ischemic changes.   05/30/2022 Ct Head without contrast  IMPRESSION:  1. No evidence of  acute intracranial abnormality.  2. Mild chronic small vessel ischemic disease Jackie moderate cerebral  atrophy.   Results RADIOLOGY Head CT: atrophy Jackie microvascular ischemic changes (05/2022)  DIAGNOSTIC Cystoscopy: negative   LABS PTAU: 217 (06/03/2023) Beta amyloid 42/40 ratio: reduced (06/03/2023)  RADIOLOGY Brain MRI: Mildly large ventricles, cortical atrophy, mild to moderate white matter disease, chronic small vessel ischemic changes (Spring 2025) Brain CT: Stable, mild ventricular enlargement (2023)   General Exam:   Vitals:   07/09/23 1408  BP: 130/72  Pulse: 67  SpO2: 97%  Weight: 59.4 kg (131 lb)  Height: 157.5 cm (5' 2)     Body mass index is 23.96 kg/m.  Neurological exam appropriate for the patient's condition was performed.  Physical Exam   General exam  Neuro exam   Below information was reviewed by ELIAS GREGORY RODRIGUEZ.  Social Drivers of Health   Tobacco Use: Medium Risk (07/09/2023)   Patient History   . Smoking Tobacco Use: Former   . Smokeless Tobacco Use: Never   . Passive Exposure: Not on file  Alcohol Use: Not on file  Financial Resource Strain: Low Risk  (06/03/2023)   Overall Financial Resource Strain (CARDIA)   . Difficulty of Paying Living Expenses: Not hard at all  Food Insecurity: Patient Declined (06/07/2023)   Received from [facility]   Hunger Vital Sign   . Within the past 12 months, you worried that your food would run out before you got the money to buy more.: Patient declined   . Within the past 12 months, the food you bought just didn't last Jackie you didn't have money to get more.: Patient declined  Transportation Needs: Patient Declined (06/07/2023)   Received from [facility] - Transportation   . Lack of Transportation (Medical): Patient declined   . Lack of Transportation (Non-Medical): Patient declined  Physical Activity: Not on file  Stress: Not on file  Social Connections: Patient Declined  (06/07/2023)   Received from [facility]   Social Connection Jackie Isolation Panel   . In a typical week, how many times do you talk on the phone with family, friends, or neighbors?: Patient declined   . How often do you get together with friends or relatives?: Patient declined   . How often do you attend church or religious services?: Patient declined   . Do you belong to any clubs or organizations such as church groups, unions, fraternal or athletic groups, or school groups?: Patient declined   . How often do you attend meetings of the clubs or organizations you belong to?: Patient declined   . Are you married, widowed, divorced, separated, never married, or living with a partner?: Patient declined  Depression: Not at risk (05/25/2023)   PHQ-2   . PHQ-2 Score: 0  Housing Stability: Low Risk  (06/03/2023)   Housing Stability Vital Sign   . Unable to Pay for Housing in the Last Year: No   . Number of Times Moved in the Last Year: 0   . Homeless in the Last Year: No  Utilities: Patient Declined (06/07/2023)   Received from [facility] Utilities   . Threatened with loss of utilities: Patient declined  Health Literacy: Not on file    Medications: Current Outpatient Medications on  File Prior to Visit  Medication Sig Dispense Refill  . aspirin  81 MG chewable tablet Take 81 mg by mouth once daily    . carvediloL  (COREG ) 6.25 MG tablet TAKE 1 TABLET BY MOUTH TWICE DAILY 360 tablet 2  . cyanocobalamin (VITAMIN B12) 1000 MCG tablet Take 1,000 mcg by mouth once daily    . donepeziL  (ARICEPT ) 10 MG tablet Take 1 tablet (10 mg total) by mouth at bedtime 90 tablet 3  . escitalopram  oxalate (LEXAPRO ) 5 MG tablet TAKE 1 TABLET BY MOUTH DAILY 90 tablet 2  . hydroCHLOROthiazide  (HYDRODIURIL ) 12.5 MG tablet TAKE 1 TABLET BY MOUTH DAILY (Patient taking differently: Take 12.5 mg by mouth once daily) 90 tablet 2  . memantine  (NAMENDA ) 10 MG tablet Take 1 tablet (10 mg total) by mouth 2 (two) times daily  180 tablet 3   No current facility-administered medications on file prior to visit.    Past Medical History:  Past Medical History:  Diagnosis Date  . Anxiety   . Dementia (CMS/HHS-HCC)   . Fracture    hips  . Hemorrhoids   . Hx of bladder cancer   . Hyperlipidemia   . Hypertension   . Nonexudative senile macular degeneration of retina - Both Eyes 10/02/2011  . Senile cataract, unspecified - Both Eyes 10/02/2011    Past Surgical History:  Past Surgical History:  Procedure Laterality Date  . EXTRACTION CATARACT EXTRACAPSULAR W/INSERTION INTRAOCULAR PROSTHESIS Left 06/13/2013   Procedure: EXTRACAPSULAR CATARACT PHACO REMOVAL WITH INSERTION OF INTRAOCULAR LENS PROSTHESIS (1 STAGE PROCEDURE), MANUAL OR MECHANICAL TECHNIQUE;  Surgeon: Grayce Donnamarie Bowl, MD;  [facility];  Service: Ophthalmology;  Laterality: Left;  LRI  . EXTRACTION CATARACT EXTRACAPSULAR W/INSERTION INTRAOCULAR PROSTHESIS Right 07/04/2013   Procedure: EXTRACTION CATARACT EXTRACAPSULAR W/INSERTION INTRAOCULAR PROSTHESIS;  Surgeon: Grayce Donnamarie Bowl, MD;  [facility];  Service: Ophthalmology;  Laterality: Right;  plan on LRI  . CATARACT EXTRACTION    . CYSTECTOMY     from abdomen  . OTHER SURGERY     left hip displaced  . OTHER SURGERY     transu. resection of bladder tumor  . OTHER SURGERY     hip/femor fracture  . TENOLYSIS EXTENSOR HAND/FINGER     Family History:  Family History  Problem Relation Name Age of Onset  . High blood pressure (Hypertension) Mother 79   . Hyperlipidemia (Elevated cholesterol) Mother 84   . Stroke Mother 48   . Multiple myeloma Father 34   . Macular degeneration Father 10   . High blood pressure (Hypertension) Sister    . Hyperlipidemia (Elevated cholesterol) Sister    . High blood pressure (Hypertension) Brother 75   . Hyperlipidemia (Elevated cholesterol) Brother 75   . Glaucoma Neg Hx    . Breast cancer Neg Hx    . Ovarian cancer Neg Hx     Social  History:  Social History   Socioeconomic History  . Marital status: Married  . Number of children: 1  Occupational History  . Occupation: Retired  Tobacco Use  . Smoking status: Former    Current packs/day: 0.00    Average packs/day: 0.5 packs/day for 5.0 years (2.5 ttl pk-yrs)    Types: Cigarettes    Start date: 04/03/1956    Quit date: 04/03/1961    Years since quitting: 62.3  . Smokeless tobacco: Never  Vaping Use  . Vaping status: Never Used  Substance Jackie Sexual Activity  . Alcohol use: Yes  Alcohol/week: 0.0 - 3.0 standard drinks of alcohol    Comment: very little  . Drug use: No  . Sexual activity: Not Currently  Social History Narrative   Exercises (walks) 30 minutes a week. Eats no fast food or red meat.    Social Drivers of Corporate investment banker Strain: Low Risk  (06/03/2023)   Overall Financial Resource Strain (CARDIA)   . Difficulty of Paying Living Expenses: Not hard at all  Food Insecurity: Patient Declined (06/07/2023)   Received from [facility]   Hunger Vital Sign   . Within the past 12 months, you worried that your food would run out before you got the money to buy more.: Patient declined   . Within the past 12 months, the food you bought just didn't last Jackie you didn't have money to get more.: Patient declined  Transportation Needs: Patient Declined (06/07/2023)   Received from [facility] - Transportation   . Lack of Transportation (Medical): Patient declined   . Lack of Transportation (Non-Medical): Patient declined  Social Connections: Patient Declined (06/07/2023)   [facility]   Social Connection Jackie Isolation Panel   . In a typical week, how many times do you talk on the phone with family, friends, or neighbors?: Patient declined   . How often do you get together with friends or relatives?: Patient declined   . How often do you attend church or religious services?: Patient declined   . Do you belong to any clubs or  organizations such as church groups, unions, fraternal or athletic groups, or school groups?: Patient declined   . How often do you attend meetings of the clubs or organizations you belong to?: Patient declined   . Are you married, widowed, divorced, separated, never married, or living with a partner?: Patient declined  Housing Stability: Low Risk  (06/03/2023)   Housing Stability Vital Sign   . Unable to Pay for Housing in the Last Year: No   . Number of Times Moved in the Last Year: 0   . Homeless in the Last Year: No   Allergies:  Allergies  Allergen Reactions  . Penicillins Hives Jackie Rash    Has patient had a PCN reaction causing immediate rash, facial/tongue/throat swelling, SOB or lightheadedness with hypotension: unknown Has patient had a PCN reaction causing severe rash involving mucus membranes or skin necrosis: unknown Has patient had a PCN reaction that required hospitalization: unknown Has patient had a PCN reaction occurring within the last 10 years: unknown If all of the above answers are NO, then may proceed with Cephalosporin use.    This note has been created using automated tools Jackie reviewed for accuracy by ELIAS GREGORY RODRIGUEZ.  Attestation Statement:   I personally performed the service, non-incident to. (WP)   ELIAS CORDELLA STALLION, NP

## 2023-08-05 ENCOUNTER — Other Ambulatory Visit: Payer: Self-pay

## 2023-08-05 ENCOUNTER — Emergency Department (HOSPITAL_COMMUNITY)

## 2023-08-05 ENCOUNTER — Encounter (HOSPITAL_COMMUNITY): Payer: Self-pay

## 2023-08-05 ENCOUNTER — Emergency Department (HOSPITAL_COMMUNITY)
Admission: EM | Admit: 2023-08-05 | Discharge: 2023-08-05 | Disposition: A | Discharge status: Home / Self Care | Attending: Emergency Medicine | Admitting: Emergency Medicine

## 2023-08-05 DIAGNOSIS — R1110 Vomiting, unspecified: Secondary | ICD-10-CM | POA: Diagnosis present

## 2023-08-05 DIAGNOSIS — R300 Dysuria: Secondary | ICD-10-CM | POA: Diagnosis not present

## 2023-08-05 DIAGNOSIS — Z79899 Other long term (current) drug therapy: Secondary | ICD-10-CM | POA: Diagnosis not present

## 2023-08-05 DIAGNOSIS — I10 Essential (primary) hypertension: Secondary | ICD-10-CM | POA: Insufficient documentation

## 2023-08-05 DIAGNOSIS — N3000 Acute cystitis without hematuria: Secondary | ICD-10-CM

## 2023-08-05 DIAGNOSIS — F0390 Unspecified dementia without behavioral disturbance: Secondary | ICD-10-CM | POA: Diagnosis not present

## 2023-08-05 DIAGNOSIS — I159 Secondary hypertension, unspecified: Secondary | ICD-10-CM

## 2023-08-05 DIAGNOSIS — Z7982 Long term (current) use of aspirin: Secondary | ICD-10-CM | POA: Diagnosis not present

## 2023-08-05 LAB — COMPREHENSIVE METABOLIC PANEL WITH GFR
ALT: 10 U/L (ref 0–44)
AST: 17 U/L (ref 15–41)
Albumin: 3.5 g/dL (ref 3.5–5.0)
Alkaline Phosphatase: 64 U/L (ref 38–126)
Anion gap: 11 (ref 5–15)
BUN: 10 mg/dL (ref 8–23)
CO2: 24 mmol/L (ref 22–32)
Calcium: 9 mg/dL (ref 8.9–10.3)
Chloride: 101 mmol/L (ref 98–111)
Creatinine, Ser: 1.02 mg/dL — ABNORMAL HIGH (ref 0.44–1.00)
GFR, Estimated: 52 mL/min — ABNORMAL LOW (ref >60)
Glucose, Bld: 152 mg/dL — ABNORMAL HIGH (ref 70–99)
Potassium: 3.2 mmol/L — ABNORMAL LOW (ref 3.5–5.1)
Sodium: 136 mmol/L (ref 135–145)
Total Bilirubin: 0.7 mg/dL (ref 0.0–1.2)
Total Protein: 6.2 g/dL — ABNORMAL LOW (ref 6.5–8.1)

## 2023-08-05 LAB — URINALYSIS, ROUTINE W REFLEX MICROSCOPIC
Bilirubin Urine: NEGATIVE
Glucose, UA: NEGATIVE mg/dL
Ketones, ur: NEGATIVE mg/dL
Nitrite: NEGATIVE
Protein, ur: NEGATIVE mg/dL
Specific Gravity, Urine: 1.01 (ref 1.005–1.030)
pH: 6 (ref 5.0–8.0)

## 2023-08-05 LAB — CBC WITH DIFFERENTIAL/PLATELET
Abs Immature Granulocytes: 0.02 K/uL (ref 0.00–0.07)
Basophils Absolute: 0.1 K/uL (ref 0.0–0.1)
Basophils Relative: 1 %
Eosinophils Absolute: 0.1 K/uL (ref 0.0–0.5)
Eosinophils Relative: 2 %
HCT: 40.8 % (ref 36.0–46.0)
Hemoglobin: 13.5 g/dL (ref 12.0–15.0)
Immature Granulocytes: 0 %
Lymphocytes Relative: 21 %
Lymphs Abs: 1.4 K/uL (ref 0.7–4.0)
MCH: 29.2 pg (ref 26.0–34.0)
MCHC: 33.1 g/dL (ref 30.0–36.0)
MCV: 88.1 fL (ref 80.0–100.0)
Monocytes Absolute: 0.8 K/uL (ref 0.1–1.0)
Monocytes Relative: 12 %
Neutro Abs: 4.3 K/uL (ref 1.7–7.7)
Neutrophils Relative %: 64 %
Platelets: 213 K/uL (ref 150–400)
RBC: 4.63 MIL/uL (ref 3.87–5.11)
RDW: 12.7 % (ref 11.5–15.5)
WBC: 6.7 K/uL (ref 4.0–10.5)
nRBC: 0 % (ref 0.0–0.2)

## 2023-08-05 LAB — TROPONIN I (HIGH SENSITIVITY): Troponin I (High Sensitivity): 6 ng/L (ref <18)

## 2023-08-05 MED ORDER — SODIUM CHLORIDE 0.9 % IV SOLN
1.0000 g | Freq: Once | INTRAVENOUS | Status: AC
  Administered 2023-08-05: 1 g via INTRAVENOUS
  Filled 2023-08-05: qty 10

## 2023-08-05 MED ORDER — CEFPODOXIME PROXETIL 200 MG PO TABS: 200.0000 mg | ORAL_TABLET | Freq: Two times a day (BID) | ORAL | 0 refills | Status: AC

## 2023-08-05 MED ORDER — SODIUM CHLORIDE 0.9 % IV BOLUS
500.0000 mL | Freq: Once | INTRAVENOUS | Status: AC
  Administered 2023-08-05: 500 mL via INTRAVENOUS

## 2023-08-05 MED ORDER — POTASSIUM CHLORIDE CRYS ER 20 MEQ PO TBCR
40.0000 meq | EXTENDED_RELEASE_TABLET | Freq: Once | ORAL | Status: AC
  Administered 2023-08-05: 40 meq via ORAL
  Filled 2023-08-05: qty 2

## 2023-08-05 NOTE — ED Triage Notes (Signed)
 Pt BIB daughter for high blood pressure reading this morning. 164/92 and 177/12 daughter states she took it on her other arm and got 144/86. Pt also has vomited twice today.

## 2023-08-05 NOTE — ED Provider Notes (Cosign Needed Addendum)
  Physical Exam  BP (!) 138/59   Pulse 75   Temp 98.4 F (36.9 C) (Oral)   Resp 16   Ht 5' 2 (1.575 m) Comment: Simultaneous filing. User may not have seen previous data.  Wt 59.9 kg Comment: Simultaneous filing. User may not have seen previous data.  SpO2 94%   BMI 24.15 kg/m   Physical Exam  Procedures  Procedures  ED Course / MDM    Medical Decision Making Amount and/or Complexity of Data Reviewed Labs: ordered. Radiology: ordered.  Risk Prescription drug management.  Signout was taken from [employer].  Patient came in with high blood pressure and vomiting today, blood pressure well-controlled at this time, pending is UA and head CT anticipate discharge patient is well-appearing.  Labs show potassium 3.2 which will be repleted, she has mild elevation in her creatinine was given IV fluids for this.  UA shows trace leukocytes, negative nitrite and 11-20 white blood cells with many bacteria but 6-7 squamous epithelial cells.  Will send for culture.  Patient not having UTI symptoms and I talked to her daughter, her daughter states that she does not have typical UTI symptoms, last time she had symptoms similar to this she did have a UTI.  They started her on Macrobid but she worsened and was unable to walk so ended up being hospitalized.  In light of this we will empirically start her on antibiotics.  Advised on close follow-up for culture results with PCP.  CT head showed no acute findings.        Suellen Sherran LABOR, PA-C 08/05/23 [address], PA-C 08/05/23 [address] PA-C 08/05/23 1719    Suzette Pac, MD 08/07/23 [phone]

## 2023-08-05 NOTE — Discharge Instructions (Addendum)
 Seen today because her blood pressure was high.  Lower showed likely mild dehydration and he was given IV fluids.  Potassium was also slightly low and you are given potassium tablet here.  Make sure you follow closely with your PCP.  Eat foods high in potassium such as bananas, orange juice, potatoes, dairy products your potassium stay up.  Urine was sent for culture.  Started on antibiotics since you do not typically have classic UTI symptoms, follow-up with your PCP in 2 days for culture results to see if you need to continue antibiotics.

## 2023-08-05 NOTE — ED Provider Notes (Signed)
 [place] EMERGENCY DEPARTMENT AT [facility] Provider Note   CSN: [id number] Arrival date & time: 08/05/23  1347     Patient presents with: Hypertension   Jackie Phelps is a 89 y.o. female.   89 y/o presents to ED with daughter. Pt has dementia and daughter is main historian. Pt was sitting at home with daughter a few hours ago when daughter advised she had a strange look on her face then she had two episodes of vomiting. Pt's BP readings after the incident were elevate past her normal. Pt has been seen for HTN and has had multiple medication changes to manage HTN. Daughter reports a similar incident a few months ago she had to be admitted for UTI. Pt is not currently complaining of any pain but keeps asking why she is at the hospital. Pt is unable to describe incident and does not recall anything from today.    Hypertension Pertinent negatives include no chest pain and no shortness of breath.       Prior to Admission medications   Medication Sig Start Date End Date Taking? Authorizing Provider  aspirin  EC 81 MG tablet Take 1 tablet by mouth daily.    [provider]  carvedilol  (COREG ) 6.25 MG tablet Take 2 tablets (12.5 mg total) by mouth 2 (two) times daily. Patient taking differently: Take 6.25 mg by mouth 2 (two) times daily with a meal. 08/13/20   Burnette, Delon CHRISTELLA, PA-C  donepezil  (ARICEPT ) 10 MG tablet Take 10 mg by mouth daily. 06/18/16   [provider]  escitalopram  (LEXAPRO ) 5 MG tablet Take 1 tablet (5 mg total) by mouth daily. 08/13/20   Vivienne Delon CHRISTELLA, PA-C  hydrochlorothiazide  (HYDRODIURIL ) 12.5 MG tablet Take 1 tablet (12.5 mg total) by mouth daily. 06/08/23 09/06/23  Maree, Pratik D, DO  memantine  (NAMENDA ) 10 MG tablet Take 10 mg by mouth 2 (two) times daily.  06/18/16   [provider]  simvastatin  (ZOCOR ) 20 MG tablet Take 1 tablet (20 mg total) by mouth daily. 08/13/20   Vivienne Delon CHRISTELLA, PA-C  vitamin B-12  (CYANOCOBALAMIN) 500 MCG tablet Take 500 mcg by mouth daily.    [provider]    Allergies: Penicillins    Review of Systems  Constitutional:  Negative for diaphoresis and fever.  HENT: Negative.    Respiratory:  Negative for chest tightness and shortness of breath.   Cardiovascular:  Negative for chest pain.  Gastrointestinal:  Positive for vomiting.  Skin: Negative.   Neurological: Negative.     Updated Vital Signs BP 126/76 (BP Location: Left Arm)   Pulse 96   Temp 98.4 F (36.9 C) (Oral)   Resp 18   Ht 5' 2 (1.575 m) Comment: Simultaneous filing. User may not have seen previous data.  Wt 59.9 kg Comment: Simultaneous filing. User may not have seen previous data.  SpO2 95%   BMI 24.15 kg/m   Physical Exam HENT:     Head: Normocephalic and atraumatic.  Eyes:     Pupils: Pupils are equal, round, and reactive to light.  Cardiovascular:     Rate and Rhythm: Normal rate.  Pulmonary:     Effort: Pulmonary effort is normal.     Breath sounds: Normal breath sounds.  Musculoskeletal:     Cervical back: Normal range of motion.  Neurological:     Mental Status: She is alert. Mental status is at baseline.     (all labs ordered are listed, but only abnormal  results are displayed) Labs Reviewed  COMPREHENSIVE METABOLIC PANEL WITH GFR  CBC WITH DIFFERENTIAL/PLATELET  URINALYSIS, ROUTINE W REFLEX MICROSCOPIC  TROPONIN I (HIGH SENSITIVITY)    EKG: None  Radiology: No results found.  Procedures   Medications Ordered in the ED  sodium chloride  0.9 % bolus 500 mL (has no administration in time range)                                   Medical Decision Making 89 y/o Female presenting with daughter following incident at home where patient had two episodes of vomiting and elevated BP readings on at home monitor. Patient has dementia and daughter is historian. Daughter has concern for UTI due to similar incident recently. Daughter reports her mother looked  distressed prior to the vomiting incident. Pt is pleasant during exam and not reporting any pain. No pain to palpation in abdomen and lungs CTA.   Due to dementia cardiac origins, TIA, or UTI needs to be ruled out. CBC, CMP, and Troponin's reviewed. EKG was sinus with LBB similar to prior EKG. No acute abnormalities noted and results discussed with patient's daughter.   Waiting on UA results. Care transferred to [facility].       Amount and/or Complexity of Data Reviewed Labs: ordered. Radiology: ordered.    Final diagnoses:  None    ED Discharge Orders     None          Myriam Fonda GORMAN DEVONNA 08/05/23 1551    Garrick Charleston, MD 08/06/23 1537

## 2023-08-05 NOTE — ED Notes (Signed)
 Patient transported to CT

## 2023-08-07 LAB — URINE CULTURE: Culture: >=100000 — AB

## 2023-08-08 ENCOUNTER — Telehealth (HOSPITAL_BASED_OUTPATIENT_CLINIC_OR_DEPARTMENT_OTHER): Payer: Self-pay | Admitting: *Deleted

## 2023-08-08 NOTE — Telephone Encounter (Signed)
 Post ED Visit - Positive Culture Follow-up  Culture report reviewed by antimicrobial stewardship pharmacist: [facility] []  Rankin Dee, Pharm.D. []  Venetia Gully, Pharm.D., BCPS AQ-ID []  Garrel Crews, Pharm.D., BCPS []  Almarie Lunger, Pharm.D., BCPS []  [place], [place].D., BCPS, AAHIVP []  Rosaline Bihari, Pharm.D., BCPS, AAHIVP []  Vernell Meier, PharmD, BCPS []  Latanya Hint, PharmD, BCPS []  Donald Medley, PharmD, BCPS []  Rocky Bold, PharmD []  Dorothyann Alert, PharmD, BCPS [x]  Dorn Buttner, PharmD  [facility] []  Rosaline Edison, PharmD []  Romona Bliss, PharmD []  Dolphus Roller, PharmD []  Veva Seip, Rph []  Vernell Daunt) Leonce, PharmD []  Eva Allis, PharmD []  Rosaline Millet, PharmD []  Iantha Batch, PharmD []  Arvin Gauss, PharmD []  Wanda Hasting, PharmD []  Ronal Rav, PharmD []  Rocky Slade, PharmD []  Bard Jeans, PharmD   Positive urine culture Treated with Cefpodoxime  Proxetil, organism sensitive to the same and no further patient follow-up is required at this time.  [name] 08/08/2023, 1:26 PM

## 2023-09-12 ENCOUNTER — Inpatient Hospital Stay (HOSPITAL_COMMUNITY)
Admission: EM | Admit: 2023-09-12 | Discharge: 2023-09-15 | DRG: 243 | Disposition: A | Discharge status: Home Health | Attending: Internal Medicine | Admitting: Internal Medicine

## 2023-09-12 ENCOUNTER — Other Ambulatory Visit: Payer: Self-pay

## 2023-09-12 ENCOUNTER — Emergency Department (HOSPITAL_COMMUNITY)

## 2023-09-12 ENCOUNTER — Encounter (HOSPITAL_COMMUNITY): Payer: Self-pay

## 2023-09-12 DIAGNOSIS — I442 Atrioventricular block, complete: Principal | ICD-10-CM | POA: Diagnosis present

## 2023-09-12 DIAGNOSIS — I10 Essential (primary) hypertension: Secondary | ICD-10-CM | POA: Diagnosis present

## 2023-09-12 DIAGNOSIS — B9620 Unspecified Escherichia coli [E. coli] as the cause of diseases classified elsewhere: Secondary | ICD-10-CM | POA: Diagnosis present

## 2023-09-12 DIAGNOSIS — I493 Ventricular premature depolarization: Secondary | ICD-10-CM | POA: Diagnosis present

## 2023-09-12 DIAGNOSIS — I4510 Unspecified right bundle-branch block: Secondary | ICD-10-CM

## 2023-09-12 DIAGNOSIS — Z8551 Personal history of malignant neoplasm of bladder: Secondary | ICD-10-CM

## 2023-09-12 DIAGNOSIS — Z79899 Other long term (current) drug therapy: Secondary | ICD-10-CM

## 2023-09-12 DIAGNOSIS — Z9841 Cataract extraction status, right eye: Secondary | ICD-10-CM

## 2023-09-12 DIAGNOSIS — Z880 Allergy status to penicillin: Secondary | ICD-10-CM

## 2023-09-12 DIAGNOSIS — Z87891 Personal history of nicotine dependence: Secondary | ICD-10-CM

## 2023-09-12 DIAGNOSIS — Z8249 Family history of ischemic heart disease and other diseases of the circulatory system: Secondary | ICD-10-CM

## 2023-09-12 DIAGNOSIS — Z87440 Personal history of urinary (tract) infections: Secondary | ICD-10-CM | POA: Diagnosis present

## 2023-09-12 DIAGNOSIS — F0390 Unspecified dementia without behavioral disturbance: Secondary | ICD-10-CM | POA: Diagnosis present

## 2023-09-12 DIAGNOSIS — E785 Hyperlipidemia, unspecified: Secondary | ICD-10-CM | POA: Diagnosis present

## 2023-09-12 DIAGNOSIS — N179 Acute kidney failure, unspecified: Secondary | ICD-10-CM | POA: Diagnosis present

## 2023-09-12 DIAGNOSIS — N390 Urinary tract infection, site not specified: Secondary | ICD-10-CM | POA: Diagnosis present

## 2023-09-12 DIAGNOSIS — Z9842 Cataract extraction status, left eye: Secondary | ICD-10-CM

## 2023-09-12 DIAGNOSIS — F01B4 Vascular dementia, moderate, with anxiety: Secondary | ICD-10-CM | POA: Diagnosis present

## 2023-09-12 DIAGNOSIS — Z96643 Presence of artificial hip joint, bilateral: Secondary | ICD-10-CM | POA: Diagnosis present

## 2023-09-12 DIAGNOSIS — Z23 Encounter for immunization: Secondary | ICD-10-CM

## 2023-09-12 DIAGNOSIS — Z7982 Long term (current) use of aspirin: Secondary | ICD-10-CM

## 2023-09-12 HISTORY — DX: Left bundle-branch block, unspecified: I44.7

## 2023-09-12 LAB — MAGNESIUM: Magnesium: 1.9 mg/dL (ref 1.7–2.4)

## 2023-09-12 LAB — CBC WITH DIFFERENTIAL/PLATELET
Abs Immature Granulocytes: 0.03 K/uL (ref 0.00–0.07)
Basophils Absolute: 0 K/uL (ref 0.0–0.1)
Basophils Relative: 1 %
Eosinophils Absolute: 0.2 K/uL (ref 0.0–0.5)
Eosinophils Relative: 3 %
HCT: 42.6 % (ref 36.0–46.0)
Hemoglobin: 14 g/dL (ref 12.0–15.0)
Immature Granulocytes: 0 %
Lymphocytes Relative: 9 %
Lymphs Abs: 0.7 K/uL (ref 0.7–4.0)
MCH: 29 pg (ref 26.0–34.0)
MCHC: 32.9 g/dL (ref 30.0–36.0)
MCV: 88.2 fL (ref 80.0–100.0)
Monocytes Absolute: 0.7 K/uL (ref 0.1–1.0)
Monocytes Relative: 9 %
Neutro Abs: 6 K/uL (ref 1.7–7.7)
Neutrophils Relative %: 78 %
Platelets: 180 K/uL (ref 150–400)
RBC: 4.83 MIL/uL (ref 3.87–5.11)
RDW: 12.7 % (ref 11.5–15.5)
WBC: 7.7 K/uL (ref 4.0–10.5)
nRBC: 0 % (ref 0.0–0.2)

## 2023-09-12 LAB — BASIC METABOLIC PANEL WITH GFR
Anion gap: 12 (ref 5–15)
BUN: 22 mg/dL (ref 8–23)
CO2: 26 mmol/L (ref 22–32)
Calcium: 9.2 mg/dL (ref 8.9–10.3)
Chloride: 102 mmol/L (ref 98–111)
Creatinine, Ser: 1.48 mg/dL — ABNORMAL HIGH (ref 0.44–1.00)
GFR, Estimated: 33 mL/min — ABNORMAL LOW (ref >60)
Glucose, Bld: 134 mg/dL — ABNORMAL HIGH (ref 70–99)
Potassium: 3.3 mmol/L — ABNORMAL LOW (ref 3.5–5.1)
Sodium: 140 mmol/L (ref 135–145)

## 2023-09-12 LAB — TSH: TSH: 1.576 u[IU]/mL (ref 0.350–4.500)

## 2023-09-12 MED ORDER — POTASSIUM CHLORIDE 20 MEQ PO PACK
40.0000 meq | PACK | Freq: Once | ORAL | Status: AC
  Administered 2023-09-12: 40 meq via ORAL
  Filled 2023-09-12: qty 2

## 2023-09-12 MED ORDER — ASPIRIN 81 MG PO TBEC
81.0000 mg | DELAYED_RELEASE_TABLET | Freq: Every day | ORAL | Status: DC
  Administered 2023-09-12 – 2023-09-15 (×4): 81 mg via ORAL
  Filled 2023-09-12 (×4): qty 1

## 2023-09-12 MED ORDER — HYDRALAZINE HCL 20 MG/ML IJ SOLN
10.0000 mg | INTRAMUSCULAR | Status: DC | PRN
  Administered 2023-09-12: 10 mg via INTRAVENOUS
  Filled 2023-09-12 (×2): qty 1

## 2023-09-12 MED ORDER — INFLUENZA VAC SPLIT HIGH-DOSE 0.5 ML IM SUSY
0.5000 mL | PREFILLED_SYRINGE | INTRAMUSCULAR | Status: AC
  Administered 2023-09-13: 0.5 mL via INTRAMUSCULAR
  Filled 2023-09-12: qty 0.5

## 2023-09-12 MED ORDER — SIMVASTATIN 20 MG PO TABS
20.0000 mg | ORAL_TABLET | Freq: Every day | ORAL | Status: DC
  Administered 2023-09-12 – 2023-09-14 (×3): 20 mg via ORAL
  Filled 2023-09-12 (×3): qty 1

## 2023-09-12 MED ORDER — AMLODIPINE BESYLATE 10 MG PO TABS
10.0000 mg | ORAL_TABLET | Freq: Every day | ORAL | Status: DC
  Administered 2023-09-12 – 2023-09-15 (×4): 10 mg via ORAL
  Filled 2023-09-12: qty 1
  Filled 2023-09-12: qty 2
  Filled 2023-09-12 (×2): qty 1

## 2023-09-12 MED ORDER — HYDROCHLOROTHIAZIDE 12.5 MG PO TABS
12.5000 mg | ORAL_TABLET | Freq: Every day | ORAL | Status: DC
  Administered 2023-09-12 – 2023-09-13 (×2): 12.5 mg via ORAL
  Filled 2023-09-12 (×2): qty 1

## 2023-09-12 MED ORDER — ENOXAPARIN SODIUM 30 MG/0.3ML IJ SOSY
30.0000 mg | PREFILLED_SYRINGE | INTRAMUSCULAR | Status: DC
  Administered 2023-09-12: 30 mg via SUBCUTANEOUS
  Filled 2023-09-12: qty 0.3

## 2023-09-12 MED ORDER — IRBESARTAN 75 MG PO TABS
75.0000 mg | ORAL_TABLET | Freq: Every day | ORAL | Status: DC
  Administered 2023-09-12 – 2023-09-15 (×4): 75 mg via ORAL
  Filled 2023-09-12 (×4): qty 1

## 2023-09-12 NOTE — Progress Notes (Signed)
 Brief cardiology note  Received call from RN that SBP>200 but asymptomatic after coreg  held.  Received hydrochlorothiazide  and amlodipine  10 this afternoon  Plan: Start hydral 10mg  iv prn SBP>170 She will need another oral drug, started an ARB (irbesartan )  Andee Flatten, MD Cardiology on call

## 2023-09-12 NOTE — ED Notes (Signed)
 Per Inpatient request, Rounding team messaged about Pt's elevated blood pressure.  Awaiting further orders.

## 2023-09-12 NOTE — Hospital Course (Addendum)
 Jackie Phelps is a 89 y.o. person living with a history of  Dementia,  hx of LBBB,HTN, HLD, hx urinary bladder malignancy, anxiety, depressionwho presented with SOB and admitted for third degree heart block on hospital day 0   Third degree Heart Block Pt was brought in by EMS after daughter found her to have SOB. On admission, she was not in respiratory distress, was afebrile with a negative CXR for an acute process. HR was 40 and EKG showed third-degree heart block. IP cardiology was on board and believed her HR was likely due to CHB exacerbated by the use of home Coreg  and Aricept . There was no improvement in HR after holding Aricept  and Coreg  for a day. EP recommended pt to get a PPM implant which was completed on 09/14/2023. Pt tolerated the procedure well. Pt's HR on discharge was 75 and she looked stable on her PE with no acute complaints.      HTN BP on admission 156/82. Home hydrochlorothiazide  and coreg  were held. Started her on amlodipine  10 mg for increased BP. Cardiology started pt on IV hydralazine  PRN and PO irbesartan  75 mg as she had elevated SBP greater than 200s. BP on discharge was 142/72. Asked her to continue Amlodipine  10mg , irbesartan  75 mg, and stop hydrochlorothiazide .     Dementia  On admission, pt had poor memory on PE--was oriented to self only not to place or year. Daughter mentioned she has a hx of dementia for several years now and per charting she has vascular dementia. Aricept  was stopped due to her low HR on admission. Pt appeared well on discharge and was accompanied by her daughter. She denied any acute complaints. Asked her to pause aricept  till PCP clears her to use it. Continue home memantine .     UTI UA from 09/08/23 was positive for nitrites and some bacteria. Pt was taking macrodantin for it. She did not endorse any urinary symptoms during her admission. Can continue taking it if she has symtoms as urine cx grew E. coli.    HLD Home simvastatin  20 mg changed  to atorvastatin  10 mg IP due to adverse drug effects with Amlodipine  with the higher dose statin. Sent her home with home simvastatin  20 mg. PCP to consider changing home statin to atorvastatin  for adverse drug affects, if pt requires.    Urinary Bladder Malignancy Per daughter, her last dose of cisplatin was last year.    Anxiety Takes Lexapro  5mg  at home

## 2023-09-12 NOTE — ED Notes (Signed)
 3E made aware Pt is in route.

## 2023-09-12 NOTE — H&P (Cosign Needed)
 Date: 09/12/2023               Patient Name:  Jackie Phelps MRN: [id number]  DOB: July 08, 1931 Age / Sex: 89 y.o., female   PCP: Glover Lenis, MD         Medical Service: Internal Medicine Teaching Service         Attending Physician: Dr. Mliss Pouch      First Contact: Rebecka Pion, DO}    Second Contact: Dr. Roetta Chars, MD          Pager Information: First Contact Pager: [phone]   Second Contact Pager: [phone]   SUBJECTIVE   Chief Complaint: Difficulty Breathing   History of Present Illness: Jackie Phelps is a 89 y.o. female with PMH of Dementia, hx LBBB, HTN, HLD, hx urinary bladder malignancy, anxiety, depression who was brought in by EMS after daughter called them. Majority of the hx obtained by daughter. Daughter found the pt to be really SOB this morning.  She had gone to the bathroom, sat in her chair, came downstairs and was very short of breath. She was very nauseated as well at the table. Daughter said pt denied any CP or HA.  No vomiting. No fevers or chills, but daughter said that she was really hot sometimes because she was fanning herself. Daughter couldn't say if the pt was dizzy or not. Mentioned that pt was seen by NP this past Thursday who mentioned her HR was very low. Pt said she has had multiple UTI's and just got her abx macrodantin 100 mg for her recent episode of UTI yesterday. Daughter said that pt doesn't endorse any dysuria or blood in her urine. They have found out about UTIs in the past as she's usually nauseous before. Pt wears underwear pull-ups and goes to the bathroom by herself. Daughter denies pt having any diarrhea or constipation. Pt's symptoms improved after she came into the ED.  On talking to the pt, she did not remember where she was and what the year was. Pt was asking the daughter what they were here for. Daughter mentioned that her mentation has been like this for several years now. Daughter noticed changes in pt that started  approximately in May. Pt was having some trouble walking into neurologist office in May. Pt had a fall in early June when she came back from the hospital. Daughter hasn't been taking her out. She is with the pt in her childhood house and has a camera that monitors her mom's movements. Pt is not allowed to walk out by herself and waits for her daughter to take her.   ED Course: Labs significant for  K: 3.3 Cr: 1.48 Imaging CXR showed no acute process Received  Consulted IMTS  Meds:  Daughter confirmed: aspirin  81 MG chewable tablet Take 81 mg by mouth once daily  carvediloL  (COREG ) 6.25 MG tablet TAKE 1 TABLET BY MOUTH TWICE DAILY 360 tablet 2  cyanocobalamin (VITAMIN B12) 1000 MCG tablet Take 1,000 mcg by mouth once daily  donepeziL  (ARICEPT ) 10 MG tablet Take 1 tablet (10 mg total) by mouth at bedtime 90 tablet 3  escitalopram  oxalate (LEXAPRO ) 5 MG tablet take 1 tablet by mouth daily 90 tablet 2  hydroCHLOROthiazide  (HYDRODIURIL ) 12.5 MG tablet TAKE 1 TABLET BY MOUTH DAILY (Patient taking differently: Take 6.5 mg by mouth once daily) 90 tablet 2  memantine  (NAMENDA ) 10 MG tablet Take 1 tablet (10 mg total) by mouth 2 (two) times daily 180 tablet 3  simvastatin  (ZOCOR ) 20 MG tablet Take 1 tablet (20 mg total) by mouth once daily 90 tablet 3  BETA-CAROTENE,A, W-C & E/MIN (OCUVITE ORAL) Take 1 tablet by mouth daily. (Patient not taking: Reported on 06/24/2023)     Current Meds  Medication Sig   aspirin  EC 81 MG tablet Take 1 tablet by mouth daily.   carvedilol  (COREG ) 6.25 MG tablet Take 2 tablets (12.5 mg total) by mouth 2 (two) times daily. (Patient taking differently: Take 6.25 mg by mouth 2 (two) times daily with a meal.)   donepezil  (ARICEPT ) 10 MG tablet Take 10 mg by mouth daily.   escitalopram  (LEXAPRO ) 5 MG tablet Take 1 tablet (5 mg total) by mouth daily.   hydrochlorothiazide  (HYDRODIURIL ) 12.5 MG tablet Take 1 tablet (12.5 mg total) by mouth daily.   memantine  (NAMENDA ) 10 MG  tablet Take 10 mg by mouth 2 (two) times daily.    nitrofurantoin, macrocrystal-monohydrate, (MACROBID) 100 MG capsule Take 100 mg by mouth 2 (two) times daily. For 5 days   potassium chloride  SA (KLOR-CON  M) 20 MEQ tablet Take 40 mEq by mouth daily.   simvastatin  (ZOCOR ) 20 MG tablet Take 1 tablet (20 mg total) by mouth daily.   vitamin B-12 (CYANOCOBALAMIN) 500 MCG tablet Take 500 mcg by mouth daily.    Past Medical History Dementia,  hx of LBBB, HTN, HLD, hx urinary bladder malignancy, anxiety, depression Past Surgical History Past Surgical History:  Procedure Laterality Date   CATARACT EXTRACTION, BILATERAL     COLONOSCOPY     COLONOSCOPY  03/05/2011   Procedure: COLONOSCOPY;  Surgeon: Claudis RAYMOND Rivet, MD;  Location: [facility];  Service: Endoscopy;  Laterality: N/A;  1200   CYSTOSCOPY W/ RETROGRADES Bilateral 08/11/2016   Procedure: CYSTOSCOPY WITH BILATERAL RETROGRADE PYELOGRAM;  Surgeon: Sherrilee Belvie CROME, MD;  Location: [facility];  Service: Urology;  Laterality: Bilateral;   HIP ARTHROPLASTY Left 01/08/2014   Procedure: ARTHROPLASTY BIPOLAR HIP;  Surgeon: Kay Ozell Cummins, MD;  Location[facility];  Service: Orthopedics;  Laterality: Left;   TOTAL HIP ARTHROPLASTY Right 01/28/2020   Procedure: TOTAL HIP ARTHROPLASTY ANTERIOR APPROACH;  Surgeon: Fidel Rogue, MD;  Location: [facility] OR;  Service: Orthopedics;  Laterality: Right;   TRANSURETHRAL RESECTION OF BLADDER TUMOR N/A 08/11/2016   Procedure: TRANSURETHRAL RESECTION OF BLADDER TUMOR (TURBT);  Surgeon: Sherrilee Belvie CROME, MD;  Location: [facility];  Service: Urology;  Laterality: N/A;   TRIGGER FINGER RELEASE Bilateral    ring and middle fingers.     Social:  Lives With: daughter on the first floor with daughter on the 2nd floor.  Level of Function: needs support from daughter for all of her ADL/iADLS PCP:  Glover Lenis, MD  Substances: Denies any current or past tobacco or recreational drug use. Used to love a glass of wine,  drinks about twice a month-- last drink was 4 months ago.   Family History:  Family History  Problem Relation Age of Onset   Hypertension Mother    Healthy Daughter    Colon cancer Neg Hx      Allergies: Allergies as of 09/12/2023 - Review Complete 09/12/2023  Allergen Reaction Noted   Penicillins Hives, Dermatitis, and Rash 02/03/2011    Review of Systems: A complete ROS was negative except as per HPI.   OBJECTIVE:   Physical Exam: Blood pressure (!) 156/82, pulse (!) 40, temperature 98.1 F (36.7 C), temperature source Oral, resp. rate 20, height 5' 2 (1.575 m), weight 59.9 kg, SpO2 97%.  Constitutional: well-appearing woman sitting comfortably in bed, in no acute distress HENT: normocephalic atraumatic, mucous membranes moist Eyes: conjunctiva non-erythematous Neck: supple Cardiovascular: regular rate and rhythm, no m/r/g Pulmonary/Chest: normal work of breathing on room air, lungs clear to auscultation bilaterally Abdominal: soft, non-tender, non-distended MSK: normal bulk and tone Neurological: poor memory, oriented to self--not place, and year, 5/5 strength in bilateral upper and lower extremities, normal gait Skin: warm and dry Psych: normal mood  Labs: CBC    Component Value Date/Time   WBC 7.7 09/12/2023 1103   RBC 4.83 09/12/2023 1103   HGB 14.0 09/12/2023 1103   HCT 42.6 09/12/2023 1103   PLT 180 09/12/2023 1103   MCV 88.2 09/12/2023 1103   MCH 29.0 09/12/2023 1103   MCHC 32.9 09/12/2023 1103   RDW 12.7 09/12/2023 1103   LYMPHSABS 0.7 09/12/2023 1103   MONOABS 0.7 09/12/2023 1103   EOSABS 0.2 09/12/2023 1103   BASOSABS 0.0 09/12/2023 1103     CMP     Component Value Date/Time   NA 140 09/12/2023 1103   K 3.3 (L) 09/12/2023 1103   CL 102 09/12/2023 1103   CO2 26 09/12/2023 1103   GLUCOSE 134 (H) 09/12/2023 1103   BUN 22 09/12/2023 1103   CREATININE 1.48 (H) 09/12/2023 1103   CREATININE 0.79 04/05/2020 1630   CALCIUM  9.2 09/12/2023 1103    PROT 6.2 (L) 08/05/2023 1435   ALBUMIN  3.5 08/05/2023 1435   AST 17 08/05/2023 1435   ALT 10 08/05/2023 1435   ALKPHOS 64 08/05/2023 1435   BILITOT 0.7 08/05/2023 1435   GFRNONAA 33 (L) 09/12/2023 1103   GFRNONAA 68 04/05/2020 1630   GFRAA 79 04/05/2020 1630    Imaging:  DG Chest Portable 1 View Result Date: 09/12/2023 EXAM: 1 VIEW XRAY OF THE CHEST 09/12/2023 11:31:30 AM COMPARISON: PA and lateral radiographs of the chest dated 05/29/2000. CLINICAL HISTORY: 89 year old female with weakness and heart block, history of UTI symptoms. FINDINGS: LUNGS AND PLEURA: No focal pulmonary opacity. No pulmonary edema. No pleural effusion. No pneumothorax. HEART AND MEDIASTINUM: Mild calcification within the aortic arch. No acute abnormality of the cardiac and mediastinal silhouettes. BONES AND SOFT TISSUES: Mild chronic elevation of the left hemidiaphragm. No acute osseous abnormality. IMPRESSION: 1. No acute process. Electronically signed by: Evalene Coho MD 09/12/2023 11:52 AM EDT RP Workstation: [id number]     EKG: personally reviewed my interpretation is bradycardia with complete AV block and Qtc prolongation.   ASSESSMENT & PLAN:   Assessment & Plan by Problem: Principal Problem:   Third degree heart block (HCC)   Jackie Phelps is a 89 y.o. person living with a history of  Dementia,  hx of LBBB,HTN, HLD, hx urinary bladder malignancy, anxiety, depressionwho presented with SOB and admitted for third degree heart block on hospital day 0  Third degree Heart Block Pt was brought in by EMS called by daughter due to feeling SOB this morning. On PE pt looked well, with no respiratory distress and normal pulmonary exam. CXR showed no acute abnormalities and pt was afebrile. Her EKG showed 3rd degree AV block and RBBB. HR was 40. Her symptoms could be due to her EKG findings that are exacerbated by her Coreg  use. Coreg  was stopped in the ED. Cardiology is on board and also believe symptoms due to  medication use. Per cardiology, monitor pt's symptoms after stopping Carvedilol  and Aricept  and decide course of action in AM. Will monitor on telemetry.   -stopped Carvedilol  and arcept -  on telemetry -consider cardiac pacing if HR lower 40  HTN BP on admission 156/82. Will continue hydrochlorothiazide . Stopped carvedilol . Started her on amlodipine  10 mg for increased BP.  -continue hydrochlorothiazide  -start Amlodipine  10mg   Dementia  Pt had poor memory on PE. Daughter mentioned she has a hx of dementia for several years now and per charting she has vascular dementia. Will stop Aricept  due to her low HR.  -Home Aricept  stopped  UTI UA from 09/08/23 was positive for nitrites and some bacteria. Pt was taking macrodantin for it. She doesn't endorse any symptoms today. Will hold of on any abx for now.   HLD On simvastatin  20 mg   Urinary Bladder Malignancy Per daughter, her last dose of cisplatin was last year.   Anxiety Takes Lexapro  5mg  at home   Best practice: Diet: Normal VTE: Enoxaparin  IVF: None,None Code: Full  Disposition planning: Prior to Admission Living Arrangement: Home, living   Anticipated Discharge Location: Home  Dispo: Admit patient to Observation with expected length of stay less than 2 midnights.  Signed: Edgardo Pontiff, DO Internal Medicine Resident  09/12/2023, 6:46 PM  On Call pager: [phone]

## 2023-09-12 NOTE — ED Triage Notes (Signed)
 Pt arrive via [facility]. EMS reports pt is a 89 year old female that lives at home with her daughter. PT has dementia but very pleasant on ride to ED. Denies pain or discomfort. No distress noted. Daughter called 911 because she noticed that over the past 2 days her mothers HR has been around 40 and her Respirations were 19 when she is typically around 79. EMS did a EKG and noticed  a heart block. Pt does not have any cardiac history. Daughter reports to EMS that pt has been having some UTI symptoms for about a month and started a another medication about 2 days ago. Daughter in route to ED.  EMS vitals BP 15076 P 42  R 19 T 97.9 CBG 121

## 2023-09-12 NOTE — ED Provider Notes (Signed)
 [facility] EMERGENCY DEPARTMENT AT [facility] Provider Note   CSN: [id number] Arrival date & time: 09/12/23  1052     Patient presents with: heart block   Jackie Phelps is a 89 y.o. female.   HPI Patient with reported slow heart rate and some quick respirations.  Found by EMS to be in likely third-degree heart block.  No history of heart block.  Is however on Coreg  and Aricept .  Has been on different antibiotics recently for UTI.  Questionable UTI symptoms and last 2 cultures have shown E. coli.   Past Medical History:  Diagnosis Date   Anxiety    Complication of anesthesia    Has increased BP after surgery.   Constipation    Dementia (HCC)    HLD (hyperlipidemia) 10/26/2018   Hypercholesteremia    Hypertension    Impaired memory    due to severe sleep apnea,on CPAP now;stopped breathing 53.9 times per hour previously on study.   Sleep apnea    Viral warts 10/26/2018   Vitamin D  deficiency disease 10/26/2018    Prior to Admission medications   Medication Sig Start Date End Date Taking? Authorizing Provider  aspirin  EC 81 MG tablet Take 1 tablet by mouth daily.    [provider]  carvedilol  (COREG ) 6.25 MG tablet Take 2 tablets (12.5 mg total) by mouth 2 (two) times daily. Patient taking differently: Take 6.25 mg by mouth 2 (two) times daily with a meal. 08/13/20   Burnette, Delon CHRISTELLA, PA-C  donepezil  (ARICEPT ) 10 MG tablet Take 10 mg by mouth daily. 06/18/16   [provider]  escitalopram  (LEXAPRO ) 5 MG tablet Take 1 tablet (5 mg total) by mouth daily. 08/13/20   Vivienne Delon CHRISTELLA, PA-C  hydrochlorothiazide  (HYDRODIURIL ) 12.5 MG tablet Take 1 tablet (12.5 mg total) by mouth daily. 06/08/23 09/06/23  Maree, Pratik D, DO  memantine  (NAMENDA ) 10 MG tablet Take 10 mg by mouth 2 (two) times daily.  06/18/16   [provider]  potassium chloride  SA (KLOR-CON  M) 20 MEQ tablet Take 40 mEq by mouth daily.    [provider]  Probiotic  Product (PROBIOTIC ADVANCED PO) Take 1 capsule by mouth at bedtime.    [provider]  simvastatin  (ZOCOR ) 20 MG tablet Take 1 tablet (20 mg total) by mouth daily. 08/13/20   Vivienne Delon CHRISTELLA, PA-C  vitamin B-12 (CYANOCOBALAMIN) 500 MCG tablet Take 500 mcg by mouth daily.    [provider]    Allergies: Penicillins    Review of Systems  Updated Vital Signs BP (!) 180/107   Pulse (!) 40   Temp 97.9 F (36.6 C) (Oral)   Resp 17   Ht 5' 2 (1.575 m)   Wt 59.9 kg   SpO2 95%   BMI 24.15 kg/m   Physical Exam Vitals and nursing note reviewed.  Cardiovascular:     Rate and Rhythm: Regular rhythm. Bradycardia present.  Pulmonary:     Breath sounds: No wheezing.  Abdominal:     Tenderness: There is no abdominal tenderness.  Neurological:     Mental Status: She is alert. Mental status is at baseline.     (all labs ordered are listed, but only abnormal results are displayed) Labs Reviewed  BASIC METABOLIC PANEL WITH GFR - Abnormal; Notable for the following components:      Result Value   Potassium 3.3 (*)    Glucose, Bld 134 (*)    Creatinine, Ser 1.48 (*)  GFR, Estimated 33 (*)    All other components within normal limits  CBC WITH DIFFERENTIAL/PLATELET  MAGNESIUM   TSH    EKG: EKG Interpretation Date/Time:  Saturday September 12 2023 11:02:26 EDT Ventricular Rate:  42 PR Interval:    QRS Duration:  166 QT Interval:  671 QTC Calculation: 561 R Axis:   -20  Text Interpretation: Complete AV block with wide QRS complex Right bundle branch block Confirmed by Patsey Lot [phone]) on 09/12/2023 11:05:42 AM  Radiology: DG Chest Portable 1 View Result Date: 09/12/2023 EXAM: 1 VIEW XRAY OF THE CHEST 09/12/2023 11:31:30 AM COMPARISON: PA and lateral radiographs of the chest dated 05/29/2000. CLINICAL HISTORY: 89 year old female with weakness and heart block, history of UTI symptoms. FINDINGS: LUNGS AND PLEURA: No focal pulmonary opacity. No pulmonary  edema. No pleural effusion. No pneumothorax. HEART AND MEDIASTINUM: Mild calcification within the aortic arch. No acute abnormality of the cardiac and mediastinal silhouettes. BONES AND SOFT TISSUES: Mild chronic elevation of the left hemidiaphragm. No acute osseous abnormality. IMPRESSION: 1. No acute process. Electronically signed by: Evalene Coho MD 09/12/2023 11:52 AM EDT RP Workstation: [id number]     Procedures   Medications Ordered in the ED - No data to display                                  Medical Decision Making Amount and/or Complexity of Data Reviewed Labs: ordered. Radiology: ordered.   Patient with bradycardia.  Appears to be in third-degree heart block but blood pressure maintained.  Is on both Coreg  and Aricept  which can slow down the heart or cause blocking.  Creatinine is mildly increased.  Discussed with Dr. Debera from cardiology who will see patient.  Will admit to internal medicine.  Will hold off on Coreg  for now.  Potential UTI felt less likely as a cause and is on antibiotics.   CRITICAL CARE Performed by: Lot Patsey Total critical care time: 30 minutes Critical care time was exclusive of separately billable procedures and treating other patients. Critical care was necessary to treat or prevent imminent or life-threatening deterioration. Critical care was time spent personally by me on the following activities: development of treatment plan with patient and/or surrogate as well as nursing, discussions with consultants, evaluation of patient's response to treatment, examination of patient, obtaining history from patient or surrogate, ordering and performing treatments and interventions, ordering and review of laboratory studies, ordering and review of radiographic studies, pulse oximetry and re-evaluation of patient's condition.      Final diagnoses:  Complete heart block [facility])    ED Discharge Orders     None           Patsey Lot, MD 09/12/23 1236

## 2023-09-12 NOTE — Consult Note (Addendum)
 Cardiology Consultation   Patient ID: Jackie Phelps MRN: [id number]; DOB: 05/25/1931  Admit date: 09/12/2023 Date of Consult: 09/12/2023  PCP:  Jackie Lenis, MD   [facility] Providers Cardiologist:  New to [facility]   Patient Profile: Jackie Phelps is a 89 y.o. female with a hx of LBBB, vascular dementia, HTN, HLD, OSA, who is being seen 09/12/2023 for the evaluation of CHB at the request of Dr Patsey.  History of Present Illness: Ms. Jackie Phelps has not previously been evaluated by Cardiology.  She has been on Carvedilol  6.25 mg x 2 tabs bid for about 5 years. She had been on donepezil  since 2018. She has a long hx of LBBB by ECG.   2 days ago, she had a home visit from a PA who noted that her heart rate was in the 40s.  It was noted that she is on the above medications.  Her daughter continued to monitor her closely and encouraged oral hydration.  She was asymptomatic until today when she developed shortness of breath with exertion.  This is new and different for her.  She was not having chest pain.  No presyncope or syncope, but she complained of feeling weak.    With the weakness and dyspnea on exertion, her daughter was concerned and that is why EMS was called.  In the ER, she is sitting still and asymptomatic.  She has vascular dementia so her memory is poor, but her daughter monitors her very carefully.  Her daughter is her power of attorney.  She is a full code.  Her daughter feels that if a pacemaker is indicated, that is what we should do.   Past Medical History:  Diagnosis Date   Anxiety    Constipation    Dementia (HCC)    HLD (hyperlipidemia)    Hypertension    LBBB (left bundle branch block)    Sleep apnea    Viral warts 10/26/2018   Vitamin D  deficiency disease 10/26/2018    Past Surgical History:  Procedure Laterality Date   CATARACT EXTRACTION, BILATERAL     COLONOSCOPY     COLONOSCOPY  03/05/2011   Procedure:  COLONOSCOPY;  Surgeon: Claudis RAYMOND Rivet, MD;  Location: [facility];  Service: Endoscopy;  Laterality: N/A;  1200   CYSTOSCOPY W/ RETROGRADES Bilateral 08/11/2016   Procedure: CYSTOSCOPY WITH BILATERAL RETROGRADE PYELOGRAM;  Surgeon: Sherrilee Belvie CROME, MD;  Location: [facility] ORS;  Service: Urology;  Laterality: Bilateral;   HIP ARTHROPLASTY Left 01/08/2014   Procedure: ARTHROPLASTY BIPOLAR HIP;  Surgeon: Kay Ozell Cummins, MD;  Location: [facility] OR;  Service: Orthopedics;  Laterality: Left;   TOTAL HIP ARTHROPLASTY Right 01/28/2020   Procedure: TOTAL HIP ARTHROPLASTY ANTERIOR APPROACH;  Surgeon: Fidel Rogue, MD;  Location: [facility] OR;  Service: Orthopedics;  Laterality: Right;   TRANSURETHRAL RESECTION OF BLADDER TUMOR N/A 08/11/2016   Procedure: TRANSURETHRAL RESECTION OF BLADDER TUMOR (TURBT);  Surgeon: Sherrilee Belvie CROME, MD;  Location: [facility] ORS;  Service: Urology;  Laterality: N/A;   TRIGGER FINGER RELEASE Bilateral    ring and middle fingers.     Home Medications:  Prior to Admission medications   Medication Sig Start Date End Date Taking? Authorizing Provider  aspirin  EC 81 MG tablet Take 1 tablet by mouth daily.    [provider]  carvedilol  (COREG ) 6.25 MG tablet Take 2 tablets (12.5 mg total) by mouth 2 (two) times daily. Patient taking differently: Take 6.25 mg by mouth 2 (two) times daily  with a meal. 08/13/20   Vivienne Delon HERO, PA-C  donepezil  (ARICEPT ) 10 MG tablet Take 10 mg by mouth daily. 06/18/16   [provider]  escitalopram  (LEXAPRO ) 5 MG tablet Take 1 tablet (5 mg total) by mouth daily. 08/13/20   Vivienne Delon HERO, PA-C  hydrochlorothiazide  (HYDRODIURIL ) 12.5 MG tablet Take 1 tablet (12.5 mg total) by mouth daily. 06/08/23 09/06/23  Maree, Pratik D, DO  memantine  (NAMENDA ) 10 MG tablet Take 10 mg by mouth 2 (two) times daily.  06/18/16   [provider]  potassium chloride  SA (KLOR-CON  M) 20 MEQ tablet Take 40 mEq by mouth daily.    [provider]   Probiotic Product (PROBIOTIC ADVANCED PO) Take 1 capsule by mouth at bedtime.    [provider]  simvastatin  (ZOCOR ) 20 MG tablet Take 1 tablet (20 mg total) by mouth daily. 08/13/20   Vivienne Delon HERO, PA-C  vitamin B-12 (CYANOCOBALAMIN) 500 MCG tablet Take 500 mcg by mouth daily.    [provider]    Allergies:    Allergies  Allergen Reactions   Penicillins Hives, Dermatitis and Rash    Patient tolerated Ancef  without any complications.    Social History:   Social History   Tobacco Use   Smoking status: Former    Current packs/day: 0.00    Average packs/day: 0.3 packs/day for 4.0 years (1.0 ttl pk-yrs)    Types: Cigarettes    Start date: 02/02/1962    Quit date: 02/02/1966    Years since quitting: 57.6   Smokeless tobacco: Never  Substance Use Topics   Alcohol use: Yes    Alcohol/week: 2.0 standard drinks of alcohol    Types: 2 Glasses of wine per week    Comment: Drinks wine     Family History:   Family History  Problem Relation Age of Onset   Hypertension Mother    Healthy Daughter    Colon cancer Neg Hx      ROS:  Please see the history of present illness.  All other ROS reviewed and negative.     Physical Exam/Data: Vitals:   09/12/23 1111 09/12/23 1145 09/12/23 1200 09/12/23 1230  BP:  (!) 207/64 (!) 180/107 (!) 207/65  Pulse:  (!) 41 (!) 40 (!) 40  Resp:  17 17 18   Temp:      TempSrc:      SpO2:  98% 95% 100%  Weight: 59.9 kg     Height: 5' 2 (1.575 m)      No intake or output data in the 24 hours ending 09/12/23 1310    09/12/2023   11:11 AM 08/05/2023    1:57 PM 06/07/2023    1:43 AM  Last 3 Weights  Weight (lbs) 132 lb 0.9 oz 132 lb 0.9 oz 132 lb 0.9 oz  Weight (kg) 59.9 kg 59.9 kg 59.9 kg     Body mass index is 24.15 kg/m.  General:  Well nourished, well developed, in no acute distress HEENT: normal Neck: no JVD Vascular: No carotid bruits; Distal pulses 2+ bilaterally Cardiac:  normal S1, S2; RRR; no murmur  Lungs:   clear to auscultation bilaterally, no wheezing, rhonchi or rales  Abd: soft, nontender, no hepatomegaly  Ext: no edema Musculoskeletal:  No deformities, BUE and BLE strength normal and equal Skin: warm and dry  Neuro:  CNs 2-12 intact, no focal abnormalities noted Psych:  Normal affect   EKG:  The EKG was personally reviewed and demonstrates:  CHB  w/ HR 42, atrial rate approx 75. RBBB morphology is new. Telemetry:  Telemetry was personally reviewed and demonstrates:  CHB w/ HR mainly 40s  Relevant CV Studies: None  Laboratory Data:  Chemistry Recent Labs  Lab 09/12/23 1103  NA 140  K 3.3*  CL 102  CO2 26  GLUCOSE 134*  BUN 22  CREATININE 1.48*  CALCIUM  9.2  MG 1.9  GFRNONAA 33*  ANIONGAP 12     Hematology Recent Labs  Lab 09/12/23 1103  WBC 7.7  RBC 4.83  HGB 14.0  HCT 42.6  MCV 88.2  MCH 29.0  MCHC 32.9  RDW 12.7  PLT 180   Thyroid   Recent Labs  Lab 09/12/23 1103  TSH 1.576     Radiology/Studies:  DG Chest Portable 1 View Result Date: 09/12/2023 EXAM: 1 VIEW XRAY OF THE CHEST 09/12/2023 11:31:30 AM COMPARISON: PA and lateral radiographs of the chest dated 05/29/2000. CLINICAL HISTORY: 89 year old female with weakness and heart block, history of UTI symptoms. FINDINGS: LUNGS AND PLEURA: No focal pulmonary opacity. No pulmonary edema. No pleural effusion. No pneumothorax. HEART AND MEDIASTINUM: Mild calcification within the aortic arch. No acute abnormality of the cardiac and mediastinal silhouettes. BONES AND SOFT TISSUES: Mild chronic elevation of the left hemidiaphragm. No acute osseous abnormality. IMPRESSION: 1. No acute process. Electronically signed by: Evalene Coho MD 09/12/2023 11:52 AM EDT RP Workstation: [id number]    Assessment and Plan: CHB - Possible medication causes include Aricept  and carvedilol , not given today. - As long as she remains on bedrest, she is stable and there is no need for a temp wire or other intervention. - Continue  to monitor on telemetry. - EP can see in a.m. and determine if her heart rhythm has improved off the medications, and then decide on a course of action  2.  Abnormal ECG - She has had a left bundle branch block for several years - On her ECG today, she has a right bundle branch block.  3.  HTN - Her blood pressure was well-controlled during her previous admission, but has been elevated here. -  Per IM.   For questions or updates, please contact [facility] Please consult [email or web address] for contact info under    Signed, Shona Shad, PA-C 09/12/2023 1:10 PM   Attending note:  Patient seen and examined.  I reviewed her records and met with the patient's daughter in the room.  I agree with above assessment by Ms. Barrett PA-C with modifications made to the note.  Ms. Fadely presents with newly documented complete heart block, right bundle branch block by ECG with heart rate in the 40s and hypertensive without acute symptoms.  She has vascular dementia and is looked after closely by her daughter, also has home health assessments.  On such assessment this past Thursday it was noted that her heart rate was in the 40s.  She was not clearly symptomatic at that time, but has become short of breath within the last 24 hours.  No obvious chest pain and no frank syncope.  She has a longstanding history of left bundle branch block by ECG.  She is also on Coreg  12.5 mg twice daily and Aricept  10 mg daily.  Patient's daughter reports no major changes in health recently other than urinary tract infections.  She is following with a neurologist and PCP through the [facility] system.  On examination she is in no distress.  Afebrile, heart rate in the 40s, systolic running 819d to 210s.  Lungs are clear.  Cardiac exam with slow regular rhythm, 1/6 systolic murmur and no gallop.  No peripheral edema noted.  Pertinent lab work includes potassium 3.3, creatinine 1.48 with GFR 33, hemoglobin 14.0, platelets  180, TSH 1.57.  Chest x-ray reports no acute process.  Patient is being admitted for further observation on the hospitalist team, continue telemetry and hold Coreg  completely.  May need to consider stopping Aricept , however would observe heart rate response off Coreg  first.  She already had evidence of conduction system disease at baseline and it is possible that her AV nodal conduction will improve off Coreg  without need for further intervention.  She does have a right bundle branch block pattern now which is new.  Will ask EP to follow and can discuss options for pacemaker if necessary.  Replete potassium.  She is on HCTZ for blood pressure control.  May need additional agent off Coreg , could consider Norvasc .  Jayson JUDITHANN Sierras, M.D., F.A.C.C.

## 2023-09-12 NOTE — ED Notes (Signed)
 EDP aware of Pt's blood pressure and aware Pt has not had her home medications this morning.  Conversation had w/ daughter regarding Coreg  prescription.

## 2023-09-13 ENCOUNTER — Encounter: Payer: Self-pay | Admitting: Cardiology

## 2023-09-13 ENCOUNTER — Observation Stay (HOSPITAL_COMMUNITY)

## 2023-09-13 DIAGNOSIS — Z23 Encounter for immunization: Secondary | ICD-10-CM | POA: Diagnosis present

## 2023-09-13 DIAGNOSIS — E785 Hyperlipidemia, unspecified: Secondary | ICD-10-CM | POA: Diagnosis present

## 2023-09-13 DIAGNOSIS — Z87891 Personal history of nicotine dependence: Secondary | ICD-10-CM | POA: Diagnosis not present

## 2023-09-13 DIAGNOSIS — Z87440 Personal history of urinary (tract) infections: Secondary | ICD-10-CM | POA: Diagnosis not present

## 2023-09-13 DIAGNOSIS — Z9842 Cataract extraction status, left eye: Secondary | ICD-10-CM | POA: Diagnosis not present

## 2023-09-13 DIAGNOSIS — Z79899 Other long term (current) drug therapy: Secondary | ICD-10-CM | POA: Diagnosis not present

## 2023-09-13 DIAGNOSIS — I442 Atrioventricular block, complete: Secondary | ICD-10-CM

## 2023-09-13 DIAGNOSIS — F03A0 Unspecified dementia, mild, without behavioral disturbance, psychotic disturbance, mood disturbance, and anxiety: Secondary | ICD-10-CM | POA: Diagnosis not present

## 2023-09-13 DIAGNOSIS — Z8249 Family history of ischemic heart disease and other diseases of the circulatory system: Secondary | ICD-10-CM | POA: Diagnosis not present

## 2023-09-13 DIAGNOSIS — Z96643 Presence of artificial hip joint, bilateral: Secondary | ICD-10-CM | POA: Diagnosis present

## 2023-09-13 DIAGNOSIS — N179 Acute kidney failure, unspecified: Secondary | ICD-10-CM | POA: Diagnosis not present

## 2023-09-13 DIAGNOSIS — N390 Urinary tract infection, site not specified: Secondary | ICD-10-CM | POA: Diagnosis present

## 2023-09-13 DIAGNOSIS — Z880 Allergy status to penicillin: Secondary | ICD-10-CM | POA: Diagnosis not present

## 2023-09-13 DIAGNOSIS — B9620 Unspecified Escherichia coli [E. coli] as the cause of diseases classified elsewhere: Secondary | ICD-10-CM | POA: Diagnosis present

## 2023-09-13 DIAGNOSIS — F01B4 Vascular dementia, moderate, with anxiety: Secondary | ICD-10-CM | POA: Diagnosis present

## 2023-09-13 DIAGNOSIS — I10 Essential (primary) hypertension: Secondary | ICD-10-CM | POA: Diagnosis present

## 2023-09-13 DIAGNOSIS — F0390 Unspecified dementia without behavioral disturbance: Secondary | ICD-10-CM | POA: Diagnosis not present

## 2023-09-13 DIAGNOSIS — Z7982 Long term (current) use of aspirin: Secondary | ICD-10-CM | POA: Diagnosis not present

## 2023-09-13 DIAGNOSIS — R0602 Shortness of breath: Secondary | ICD-10-CM

## 2023-09-13 DIAGNOSIS — Z9841 Cataract extraction status, right eye: Secondary | ICD-10-CM | POA: Diagnosis not present

## 2023-09-13 DIAGNOSIS — I493 Ventricular premature depolarization: Secondary | ICD-10-CM | POA: Diagnosis present

## 2023-09-13 DIAGNOSIS — Z950 Presence of cardiac pacemaker: Secondary | ICD-10-CM | POA: Diagnosis not present

## 2023-09-13 DIAGNOSIS — Z8551 Personal history of malignant neoplasm of bladder: Secondary | ICD-10-CM | POA: Diagnosis not present

## 2023-09-13 LAB — CBC
HCT: 40.8 % (ref 36.0–46.0)
Hemoglobin: 13.8 g/dL (ref 12.0–15.0)
MCH: 29.2 pg (ref 26.0–34.0)
MCHC: 33.8 g/dL (ref 30.0–36.0)
MCV: 86.3 fL (ref 80.0–100.0)
Platelets: 175 K/uL (ref 150–400)
RBC: 4.73 MIL/uL (ref 3.87–5.11)
RDW: 12.9 % (ref 11.5–15.5)
WBC: 7.8 K/uL (ref 4.0–10.5)
nRBC: 0 % (ref 0.0–0.2)

## 2023-09-13 LAB — ECHOCARDIOGRAM COMPLETE
Area-P 1/2: 1.8 cm2
Est EF: >75
Height: 62 in
S' Lateral: 2.2 cm
Weight: 1957.68 [oz_av]

## 2023-09-13 LAB — BASIC METABOLIC PANEL WITH GFR
Anion gap: 13 (ref 5–15)
BUN: 23 mg/dL (ref 8–23)
CO2: 21 mmol/L — ABNORMAL LOW (ref 22–32)
Calcium: 9 mg/dL (ref 8.9–10.3)
Chloride: 104 mmol/L (ref 98–111)
Creatinine, Ser: 1.27 mg/dL — ABNORMAL HIGH (ref 0.44–1.00)
GFR, Estimated: 40 mL/min — ABNORMAL LOW (ref >60)
Glucose, Bld: 112 mg/dL — ABNORMAL HIGH (ref 70–99)
Potassium: 3.6 mmol/L (ref 3.5–5.1)
Sodium: 138 mmol/L (ref 135–145)

## 2023-09-13 LAB — SURGICAL PCR SCREEN
MRSA, PCR: NEGATIVE
Staphylococcus aureus: NEGATIVE

## 2023-09-13 MED ORDER — CHLORHEXIDINE GLUCONATE CLOTH 2 % EX PADS
6.0000 | MEDICATED_PAD | Freq: Every day | CUTANEOUS | Status: DC
  Administered 2023-09-13 – 2023-09-15 (×3): 6 via TOPICAL

## 2023-09-13 MED ORDER — ORAL CARE MOUTH RINSE
15.0000 mL | OROMUCOSAL | Status: DC | PRN
Start: 2023-09-13 — End: 2023-09-15

## 2023-09-13 MED ORDER — SODIUM CHLORIDE 0.9% FLUSH: 3.0000 mL | INTRAVENOUS | Status: DC | PRN

## 2023-09-13 NOTE — Plan of Care (Signed)

## 2023-09-13 NOTE — Consult Note (Addendum)
 Electrophysiology Consultation   Patient ID: Jackie Phelps MRN: [id number]; DOB: 08-22-1931  Admit date: 09/12/2023 Date of Consult: 09/13/2023  PCP:  Glover Lenis, MD   [facility] Providers Cardiologist:  Jayson Sierras, MD  Electrophysiologist:  Leanna Hamid Gladis Norton, MD       Patient Profile: Jackie Phelps is a 89 y.o. female with a hx of  LBBB, vascular dementia, HTN, HLD, UTI and OSA,  who is being seen 09/13/2023 for the evaluation of CHB at the request of Dr Trudy.  History of Present Illness: Jackie Phelps had been in her usual state of health when her routine check showed a heart rate in the low 40s.  That practitioner mentions that she was on Coreg  and Aricept , but did not stop either med.  Her daughter worked with her to make sure she felt okay and was hydrated.  At that time, she reported no symptoms.  Prior to admission, she was on carvedilol  12.5 mg twice daily and donezepril 10 mg daily.  On the day of admission, she was feeling weak and was short of breath with minimal activity.  That is what led to her being brought to the ER, and to her admission.  On admission, she was in complete heart block with a ventricular escape rate in the low 40s.  While lying still, she had no symptoms.  However, she was weak and short of breath with minimal activity.  The carvedilol  and the Aricept  were held and EP asked to see.  Jackie Phelps is currently resting comfortably.  She denies any presyncope or syncope.  She has not been out of bed much, but does not remember any symptoms with this.  However, because of the dementia, her memory is very poor.  Jackie Phelps is not quite sure why she is here.  Her daughter is not present, her daughter generally provides most of the information.    Her daughter is also the patient's power of attorney.  On admission, CODE STATUS was reviewed and she is a full code.  The idea of inserting a pacemaker was also broached and the  daughter would be in favor of it if needed.    Past Medical History:  Diagnosis Date   Anxiety    Constipation    Dementia (HCC)    HLD (hyperlipidemia)    Hypertension    LBBB (left bundle branch block)    Sleep apnea    Viral warts 10/26/2018   Vitamin D  deficiency disease 10/26/2018    Past Surgical History:  Procedure Laterality Date   CATARACT EXTRACTION, BILATERAL     COLONOSCOPY     COLONOSCOPY  03/05/2011   Procedure: COLONOSCOPY;  Surgeon: Claudis RAYMOND Rivet, MD;  Location: [facility];  Service: Endoscopy;  Laterality: N/A;  1200   CYSTOSCOPY W/ RETROGRADES Bilateral 08/11/2016   Procedure: CYSTOSCOPY WITH BILATERAL RETROGRADE PYELOGRAM;  Surgeon: Sherrilee Belvie CROME, MD;  Location: [facility] ORS;  Service: Urology;  Laterality: Bilateral;   HIP ARTHROPLASTY Left 01/08/2014   Procedure: ARTHROPLASTY BIPOLAR HIP;  Surgeon: Kay Ozell Cummins, MD;  Location: [facility] OR;  Service: Orthopedics;  Laterality: Left;   TOTAL HIP ARTHROPLASTY Right 01/28/2020   Procedure: TOTAL HIP ARTHROPLASTY ANTERIOR APPROACH;  Surgeon: Fidel Rogue, MD;  Location: [facility] OR;  Service: Orthopedics;  Laterality: Right;   TRANSURETHRAL RESECTION OF BLADDER TUMOR N/A 08/11/2016   Procedure: TRANSURETHRAL RESECTION OF BLADDER TUMOR (TURBT);  Surgeon: Sherrilee Belvie CROME, MD;  Location: [facility];  Service: Urology;  Laterality: N/A;   TRIGGER FINGER RELEASE Bilateral    ring and middle fingers.     Home Medications:  Prior to Admission medications   Medication Sig Start Date End Date Taking? Authorizing Provider  aspirin  EC 81 MG tablet Take 1 tablet by mouth daily.   Yes [provider]  carvedilol  (COREG ) 6.25 MG tablet Take 2 tablets (12.5 mg total) by mouth 2 (two) times daily. Patient taking differently: Take 6.25 mg by mouth 2 (two) times daily with a meal. 08/13/20  Yes Burnette, Delon HERO, PA-C  donepezil  (ARICEPT ) 10 MG tablet Take 10 mg by mouth daily. 06/18/16  Yes [provider]   escitalopram  (LEXAPRO ) 5 MG tablet Take 1 tablet (5 mg total) by mouth daily. 08/13/20  Yes Vivienne Delon HERO, PA-C  hydrochlorothiazide  (HYDRODIURIL ) 12.5 MG tablet Take 1 tablet (12.5 mg total) by mouth daily. 06/08/23 09/12/23 Yes Shah, Pratik D, DO  memantine  (NAMENDA ) 10 MG tablet Take 10 mg by mouth 2 (two) times daily.  06/18/16  Yes [provider]  nitrofurantoin, macrocrystal-monohydrate, (MACROBID) 100 MG capsule Take 100 mg by mouth 2 (two) times daily. For 5 days 09/11/23 09/16/23 Yes [provider]  potassium chloride  SA (KLOR-CON  M) 20 MEQ tablet Take 40 mEq by mouth daily.   Yes [provider]  simvastatin  (ZOCOR ) 20 MG tablet Take 1 tablet (20 mg total) by mouth daily. 08/13/20  Yes Vivienne Delon HERO, PA-C  vitamin B-12 (CYANOCOBALAMIN) 500 MCG tablet Take 500 mcg by mouth daily.   Yes [provider]  Probiotic Product (PROBIOTIC ADVANCED PO) Take 1 capsule by mouth at bedtime. Patient not taking: Reported on 09/12/2023    [provider]    Scheduled Meds:  amLODipine   10 mg Oral Daily   aspirin  EC  81 mg Oral Daily   enoxaparin  (LOVENOX ) injection  30 mg Subcutaneous Q24H   hydrochlorothiazide   12.5 mg Oral Daily   Influenza vac split trivalent PF  0.5 mL Intramuscular Tomorrow-1000   irbesartan   75 mg Oral Daily   simvastatin   20 mg Oral Daily   Continuous Infusions:  PRN Meds: hydrALAZINE   Allergies:    Allergies  Allergen Reactions   Penicillins Hives, Dermatitis and Rash    Patient tolerated Ancef  without any complications.    Social History:   Social History   Socioeconomic History   Marital status: Married    Spouse name: Not on file   Number of children: Not on file   Years of education: Not on file   Highest education level: Not on file  Occupational History   Not on file  Tobacco Use   Smoking status: Former    Current packs/day: 0.00    Average packs/day: 0.3 packs/day for 4.0 years (1.0 ttl pk-yrs)     Types: Cigarettes    Start date: 02/02/1962    Quit date: 02/02/1966    Years since quitting: 57.6   Smokeless tobacco: Never  Vaping Use   Vaping status: Never Used  Substance and Sexual Activity   Alcohol use: Yes    Alcohol/week: 2.0 standard drinks of alcohol    Types: 2 Glasses of wine per week    Comment: Drinks wine   Drug use: No   Sexual activity: Not Currently    Birth control/protection: Post-menopausal  Other Topics Concern   Not on file  Social History Narrative   Married 60 years.  Now widowed.  Non-smoker.   Social Drivers of Dispensing optician  Resource Strain: Low Risk  (06/03/2023)   Received from [facility]   Overall Financial Resource Strain (CARDIA)    Difficulty of Paying Living Expenses: Not hard at all  Food Insecurity: No Food Insecurity (09/12/2023)   Hunger Vital Sign    Worried About Running Out of Food in the Last Year: Never true    Ran Out of Food in the Last Year: Never true  Transportation Needs: No Transportation Needs (09/12/2023)   PRAPARE - Administrator, Civil Service (Medical): No    Lack of Transportation (Non-Medical): No  Physical Activity: Not on file  Stress: Not on file  Social Connections: Moderately Integrated (09/12/2023)   Social Connection and Isolation Panel    Frequency of Communication with Friends and Family: More than three times a week    Frequency of Social Gatherings with Friends and Family: More than three times a week    Attends Religious Services: 1 to 4 times per year    Active Member of [employer] or Organizations: Yes    Attends Banker Meetings: 1 to 4 times per year    Marital Status: Widowed  Intimate Partner Violence: Not At Risk (09/12/2023)   Humiliation, Afraid, Rape, and Kick questionnaire    Fear of Current or Ex-Partner: No    Emotionally Abused: No    Physically Abused: No    Sexually Abused: No    Family History:   Family History  Problem Relation Age of  Onset   Hypertension Mother    Healthy Daughter    Colon cancer Neg Hx      ROS:  Please see the history of present illness.  All other ROS reviewed and negative.     Physical Exam/Data: Vitals:   09/12/23 1940 09/12/23 2055 09/13/23 0024 09/13/23 0435  BP: (!) 226/63 (!) 166/50 (!) 149/52 (!) 170/63  Pulse: (!) 44     Resp:   17 18  Temp:   98.1 F (36.7 C) 97.7 F (36.5 C)  TempSrc:   Oral Oral  SpO2: 95%     Weight:    55.5 kg  Height:       No intake or output data in the 24 hours ending 09/13/23 0728    09/13/2023    4:35 AM 09/12/2023   11:11 AM 08/05/2023    1:57 PM  Last 3 Weights  Weight (lbs) 122 lb 5.7 oz 132 lb 0.9 oz 132 lb 0.9 oz  Weight (kg) 55.5 kg 59.9 kg 59.9 kg     Body mass index is 22.38 kg/m.  General:  Well nourished, well developed, in no acute distress HEENT: normal Neck: no JVD Vascular: No carotid bruits; Distal pulses 2+ bilaterally Cardiac:  normal S1, S2; RRR; no murmur  Lungs:  clear to auscultation bilaterally, no wheezing, rhonchi or rales  Abd: soft, nontender, no hepatomegaly  Ext: no edema Musculoskeletal:  No deformities, BUE and BLE strength normal and equal Skin: warm and dry  Neuro:  CNs 2-12 intact, no focal abnormalities noted Psych:  Normal affect   EKG:  The EKG was personally reviewed and demonstrates: Complete heart block with a ventricular escape rhythm at a rate of 42 bpm, atrial rate approx 75. RBBB morphology is new.  Telemetry:  Telemetry was personally reviewed and demonstrates:  CHB with ventricular escape rhythm with heart rates generally in the high 30s  Relevant CV Studies: ECHO: ordered  Laboratory Data: High Sensitivity Troponin:  No results for  input(s): TROPONINIHS in the last 720 hours.   Chemistry Recent Labs  Lab 09/12/23 1103 09/13/23 0150  NA 140 138  K 3.3* 3.6  CL 102 104  CO2 26 21*  GLUCOSE 134* 112*  BUN 22 23  CREATININE 1.48* 1.27*  CALCIUM  9.2 9.0  MG 1.9  --   GFRNONAA 33* 40*   ANIONGAP 12 13    No results for input(s): PROT, ALBUMIN , AST, ALT, ALKPHOS, BILITOT in the last 168 hours. Lipids No results for input(s): CHOL, TRIG, HDL, LABVLDL, LDLCALC, CHOLHDL in the last 168 hours.  Hematology Recent Labs  Lab 09/12/23 1103 09/13/23 0150  WBC 7.7 7.8  RBC 4.83 4.73  HGB 14.0 13.8  HCT 42.6 40.8  MCV 88.2 86.3  MCH 29.0 29.2  MCHC 32.9 33.8  RDW 12.7 12.9  PLT 180 175   Thyroid   Recent Labs  Lab 09/12/23 1103  TSH 1.576    BNPNo results for input(s): BNP, PROBNP in the last 168 hours.  DDimer No results for input(s): DDIMER in the last 168 hours.  Radiology/Studies:  DG Chest Portable 1 View Result Date: 09/12/2023 EXAM: 1 VIEW XRAY OF THE CHEST 09/12/2023 11:31:30 AM COMPARISON: PA and lateral radiographs of the chest dated 05/29/2000. CLINICAL HISTORY: 89 year old female with weakness and heart block, history of UTI symptoms. FINDINGS: LUNGS AND PLEURA: No focal pulmonary opacity. No pulmonary edema. No pleural effusion. No pneumothorax. HEART AND MEDIASTINUM: Mild calcification within the aortic arch. No acute abnormality of the cardiac and mediastinal silhouettes. BONES AND SOFT TISSUES: Mild chronic elevation of the left hemidiaphragm. No acute osseous abnormality. IMPRESSION: 1. No acute process. Electronically signed by: Evalene Coho MD 09/12/2023 11:52 AM EDT RP Workstation: [id number]     Assessment and Plan: CHB - Because of her history of a left bundle branch block, she has known conduction system disease. - Despite more than 24 hours off the carvedilol  and Aricept , her rhythm and heart rate have not improved. - f/u on echo, TSH was normal - MD to review the risks and benefits of pacemaker insertion with the patient and her daughter.  2.  Hypertension - Patient's blood pressure was significantly elevated in the emergency room and since she is now off the carvedilol , she better blood pressure control. - IM  has continued the HCTZ 12.5 mg daily and added amlodipine  10 mg daily -Her blood pressure has improved - With her abnormal renal function and low potassium, consider changing the diuretic to another class of medications.  Otherwise, per IM   Risk Assessment/Risk Scores:     For questions or updates, please contact [facility] Please consult [email or web address] for contact info under    Signed, Shona Shad, PA-C  09/13/2023 7:28 AM  I have seen and examined this patient with Shona Shad.  Agree with above, note added to reflect my findings.  Patient with a past history as above.  She presented to the hospital with low heart rates.  She has had low heart rates for quite some time, but has since developed shortness of breath and fatigue with exertion.  She is on carvedilol  and Aricept .  This have both been stopped.  While resting in bed, she is comfortable.  She has no acute complaints.  Both medications have been held and she has continued to have bradycardia associated with complete heart block.  GEN: No acute distress.   Neck: No JVD Cardiac: Bradycardic, no murmurs, rubs, or gallops.  Respiratory: normal BS bases bilaterally. GI:  Soft, nontender, non-distended  MS: No edema; No deformity. Neuro:  Nonfocal  Skin: warm and dry Psych: Normal affect    Complete heart block: Patient has not had carvedilol  or Aricept  in the last 24 hours.  She continues to have complete heart block.  She Jackie Phelps need pacemaker implant.  Risks and benefits have been discussed.  Both the patient and daughter understands the risks and has agreed to the procedure.  Jackie Phelps give her a clear liquid breakfast tomorrow. Hypertension: Better controlled today.  Explained risks, benefits, and alternatives to PPM implantation, including but not limited to bleeding, infection, pneumothorax, pericardial effusion, lead dislodgement, heart attack, stroke, or death.  Pt verbalized understanding and agrees to  proceed.   Jackie Phelps M. Zuria Fosdick MD 09/13/2023 11:16 AM

## 2023-09-13 NOTE — Progress Notes (Signed)
 HD#0 SUBJECTIVE:  Patient Summary: Jackie Phelps is a 89 y.o. person living with a history of  Dementia,  hx of LBBB,HTN, HLD, hx urinary bladder malignancy, anxiety, depressionwho presented with SOB and admitted for third degree heart block   Overnight Events: Pt's SBP was over 200. Pt was asymptomatic. Cardiology started her on oral irbesartan  75 mg and IV hydralazine  PRN if SBP>170.   Interim History:  Saw pt at AM. She was resting comfortably in bed. Daughter was beside her, daughter said pt is back to her baseline. Denies any CP, SOB. Pt does not have any acute complaints.   OBJECTIVE:  Vital Signs: Vitals:   09/13/23 0024 09/13/23 0435 09/13/23 0810 09/13/23 1105  BP: (!) 149/52 (!) 170/63 (!) 144/50 (!) 144/44  Pulse:   (!) 37 (!) 39  Resp: 17 18 20 20   Temp: 98.1 F (36.7 C) 97.7 F (36.5 C) 97.8 F (36.6 C) 97.6 F (36.4 C)  TempSrc: Oral Oral Oral Oral  SpO2:   95% 96%  Weight:  55.5 kg    Height:       Supplemental O2: Room Air SpO2: 96 %  Filed Weights   09/12/23 1111 09/13/23 0435  Weight: 59.9 kg 55.5 kg     Intake/Output Summary (Last 24 hours) at 09/13/2023 1146 Last data filed at 09/13/2023 9095 Gross per 24 hour  Intake 240 ml  Output --  Net 240 ml   Net IO Since Admission: 240 mL [09/13/23 1146]  Physical Exam: Physical Exam Constitutional:      General: She is not in acute distress.    Appearance: Normal appearance.  Cardiovascular:     Rate and Rhythm: Regular rhythm. Bradycardia present.     Pulses: Normal pulses.  Pulmonary:     Effort: Pulmonary effort is normal.     Breath sounds: Normal breath sounds.  Musculoskeletal:     Right lower leg: No edema.     Left lower leg: No edema.     Comments: RLE and LLE skin was warm  Skin:    General: Skin is warm.  Neurological:     Mental Status: She is alert.  Psychiatric:        Mood and Affect: Mood normal.        Behavior: Behavior normal.     Patient Lines/Drains/Airways  Status     Active Line/Drains/Airways     Name Placement date Placement time Site Days   Peripheral IV 09/12/23 20 G 1 Anterior;Distal;Left;Upper Arm 09/12/23  1022  Arm  1   Peripheral IV 09/12/23 20 G 1 Right Antecubital 09/12/23  1123  Antecubital  1            Pertinent labs and imaging:      Latest Ref Rng & Units 09/13/2023    1:50 AM 09/12/2023   11:03 AM 08/05/2023    2:35 PM  CBC  WBC 4.0 - 10.5 K/uL 7.8  7.7  6.7   Hemoglobin 12.0 - 15.0 g/dL 86.1  85.9  86.4   Hematocrit 36.0 - 46.0 % 40.8  42.6  40.8   Platelets 150 - 400 K/uL 175  180  213        Latest Ref Rng & Units 09/13/2023    1:50 AM 09/12/2023   11:03 AM 08/05/2023    2:35 PM  CMP  Glucose 70 - 99 mg/dL 887  865  847   BUN 8 - 23 mg/dL 23  22  10  Creatinine 0.44 - 1.00 mg/dL 8.72  8.51  8.97   Sodium 135 - 145 mmol/L 138  140  136   Potassium 3.5 - 5.1 mmol/L 3.6  3.3  3.2   Chloride 98 - 111 mmol/L 104  102  101   CO2 22 - 32 mmol/L 21  26  24    Calcium  8.9 - 10.3 mg/dL 9.0  9.2  9.0   Total Protein 6.5 - 8.1 g/dL   6.2   Total Bilirubin 0.0 - 1.2 mg/dL   0.7   Alkaline Phos 38 - 126 U/L   64   AST 15 - 41 U/L   17   ALT 0 - 44 U/L   10     ECHOCARDIOGRAM COMPLETE Result Date: 09/13/2023    ECHOCARDIOGRAM REPORT   Patient Name:   Jackie Phelps Date of Exam: 09/13/2023 Medical Rec #:  [id number]        Height:       62.0 in Accession #:    [id number]       Weight:       122.4 lb Date of Birth:  [birth year]        BSA:          1.551 m Patient Age:    89 years         BP:           144/50 mmHg Patient Gender: F                HR:           39 bpm. Exam Location:  Inpatient Procedure: 2D Echo (Both Spectral and Color Flow Doppler were utilized during            procedure). Indications:    complete heart block  History:        Patient has no prior history of Echocardiogram examinations.                 Abnormal ECG; Risk Factors:Hypertension and Dyslipidemia.  Sonographer:    Tinnie Barefoot RDCS  Referring Phys: [id number] RHONDA G BARRETT IMPRESSIONS  1. Left ventricular ejection fraction, by estimation, is >75%. The left ventricle has hyperdynamic function. The left ventricle has no regional wall motion abnormalities. There is mild left ventricular hypertrophy. Left ventricular diastolic parameters are indeterminate.  2. Right ventricular systolic function is normal. The right ventricular size is normal.  3. The mitral valve is normal in structure. Trivial mitral valve regurgitation. No evidence of mitral stenosis.  4. The aortic valve is tricuspid. Aortic valve regurgitation is not visualized. Aortic valve sclerosis is present, with no evidence of aortic valve stenosis.  5. The inferior vena cava is normal in size with greater than 50% respiratory variability, suggesting right atrial pressure of 3 mmHg. Comparison(s): No prior Echocardiogram. FINDINGS  Left Ventricle: Left ventricular ejection fraction, by estimation, is >75%. The left ventricle has hyperdynamic function. The left ventricle has no regional wall motion abnormalities. The left ventricular internal cavity size was normal in size. There is mild left ventricular hypertrophy. Left ventricular diastolic parameters are indeterminate. Right Ventricle: The right ventricular size is normal. Right ventricular systolic function is normal. Left Atrium: Left atrial size was normal in size. Right Atrium: Right atrial size was normal in size. Pericardium: There is no evidence of pericardial effusion. Mitral Valve: The mitral valve is normal in structure. Mild mitral annular calcification. Trivial mitral valve regurgitation. No evidence of mitral valve stenosis. Tricuspid Valve:  The tricuspid valve is normal in structure. Tricuspid valve regurgitation is not demonstrated. No evidence of tricuspid stenosis. Aortic Valve: The aortic valve is tricuspid. Aortic valve regurgitation is not visualized. Aortic valve sclerosis is present, with no evidence of aortic valve  stenosis. Pulmonic Valve: The pulmonic valve was normal in structure. Pulmonic valve regurgitation is not visualized. No evidence of pulmonic stenosis. Aorta: The aortic root is normal in size and structure. Venous: The inferior vena cava is normal in size with greater than 50% respiratory variability, suggesting right atrial pressure of 3 mmHg. IAS/Shunts: No atrial level shunt detected by color flow Doppler.  LEFT VENTRICLE PLAX 2D LVIDd:         4.20 cm   Diastology LVIDs:         2.20 cm   LV e' medial:    13.30 cm/s LV PW:         1.00 cm   LV E/e' medial:  6.2 LV IVS:        1.20 cm   LV e' lateral:   13.80 cm/s LVOT diam:     1.80 cm   LV E/e' lateral: 5.9 LV SV:         91 LV SV Index:   59 LVOT Area:     2.54 cm  RIGHT VENTRICLE             IVC RV Basal diam:  2.60 cm     IVC diam: 1.20 cm RV S prime:     15.00 cm/s TAPSE (M-mode): 2.6 cm LEFT ATRIUM             Index        RIGHT ATRIUM           Index LA diam:        3.30 cm 2.13 cm/m   RA Area:     14.50 cm LA Vol (A2C):   40.6 ml 26.17 ml/m  RA Volume:   35.80 ml  23.08 ml/m LA Vol (A4C):   42.1 ml 27.14 ml/m LA Biplane Vol: 43.1 ml 27.78 ml/m  AORTIC VALVE LVOT Vmax:   129.00 cm/s LVOT Vmean:  84.600 cm/s LVOT VTI:    0.359 m  AORTA Ao Root diam: 2.60 cm Ao Asc diam:  2.60 cm MITRAL VALVE MV Area (PHT): 1.80 cm     SHUNTS MV Decel Time: 422 msec     Systemic VTI:  0.36 m MV E velocity: 81.90 cm/s   Systemic Diam: 1.80 cm MV A velocity: 112.00 cm/s MV E/A ratio:  0.73 Redell Shallow MD Electronically signed by Redell Shallow MD Signature Date/Time: 09/13/2023/10:01:44 AM    Final     ASSESSMENT/PLAN:  Assessment: Principal Problem:   Third degree heart block (HCC) BRYTTANY TORTORELLI is a 89 y.o. person living with a history of  Dementia,  hx of LBBB,HTN, HLD, hx urinary bladder malignancy, anxiety, depressionwho presented with SOB and admitted for third degree heart block   Plan:  SOB Pt's episode of SOB was likely 2/2 to her third  degree heart block. Coreg  and Aricept  stopped due to them likely exacerbating her symptoms 2/2 to heart block. Electrophysiology consult recommended pacemaker insertion for CHB.   -Pt to get Cardiac pacemaker  -Carvedilol  and Aricept  on hold -on telemetry   HTN Currently 149/52. Pt had elevated SBP last night and was started on irbesartan  and IV hydralazine  as needed if SBP> 170. Will continue hydrochlorothiazide  and amlodipine .  -started irbesartan  75 mg -  IV hydralazine  PRN if SBP> 170 -continue hydrochlorothiazide  -continue Amlodipine  10mg    Dementia  Pt has mild-moderate dementia but appears well. Daughter said she is back to her baseline today. Will get PT evaluation to get her moving.   -PT evaluation  -Home Aricept  stopped  Chronic Conditions   UTI UA from 09/08/23 was positive for nitrites and some bacteria. Pt was taking macrodantin for it. She doesn't endorse any symptoms today. Will hold of on any abx for now.    HLD On home simvastatin  20 mg -continue simvastatin  20 mg     Urinary Bladder Malignancy Per daughter, her last dose of cisplatin was last year.    Anxiety Takes Lexapro  5mg  at home   Best Practice: Diet: Normal VTE: Enoxaparin  IVF: None,None Code: Full   Disposition planning: Prior to Admission Living Arrangement: Home, living   Anticipated Discharge Location: Home   Dispo: Admit patient to Observation with expected length of stay less than 2 midnights.    Signature:  Rebecka Edgardo [facility]  11:46 AM, 09/13/2023  On Call pager [phone]

## 2023-09-13 NOTE — Progress Notes (Signed)
  Echocardiogram 2D Echocardiogram has been performed.  Jackie Phelps 09/13/2023, 9:36 AM

## 2023-09-14 ENCOUNTER — Inpatient Hospital Stay (HOSPITAL_COMMUNITY)
Admission: EM | Disposition: A | Discharge status: Home Health | Payer: Self-pay | Source: Home / Self Care | Attending: Internal Medicine

## 2023-09-14 DIAGNOSIS — Z79899 Other long term (current) drug therapy: Secondary | ICD-10-CM

## 2023-09-14 DIAGNOSIS — Z792 Long term (current) use of antibiotics: Secondary | ICD-10-CM

## 2023-09-14 DIAGNOSIS — C679 Malignant neoplasm of bladder, unspecified: Secondary | ICD-10-CM

## 2023-09-14 DIAGNOSIS — F0390 Unspecified dementia without behavioral disturbance: Secondary | ICD-10-CM

## 2023-09-14 DIAGNOSIS — Z87440 Personal history of urinary (tract) infections: Secondary | ICD-10-CM

## 2023-09-14 DIAGNOSIS — N390 Urinary tract infection, site not specified: Secondary | ICD-10-CM

## 2023-09-14 HISTORY — PX: PACEMAKER IMPLANT: EP1218

## 2023-09-14 LAB — URINALYSIS, ROUTINE W REFLEX MICROSCOPIC
Bilirubin Urine: NEGATIVE
Glucose, UA: NEGATIVE mg/dL
Ketones, ur: NEGATIVE mg/dL
Nitrite: NEGATIVE
Protein, ur: NEGATIVE mg/dL
Specific Gravity, Urine: 1.012 (ref 1.005–1.030)
pH: 5 (ref 5.0–8.0)

## 2023-09-14 LAB — BASIC METABOLIC PANEL WITH GFR
Anion gap: 11 (ref 5–15)
BUN: 28 mg/dL — ABNORMAL HIGH (ref 8–23)
CO2: 24 mmol/L (ref 22–32)
Calcium: 8.4 mg/dL — ABNORMAL LOW (ref 8.9–10.3)
Chloride: 102 mmol/L (ref 98–111)
Creatinine, Ser: 1.39 mg/dL — ABNORMAL HIGH (ref 0.44–1.00)
GFR, Estimated: 36 mL/min — ABNORMAL LOW (ref >60)
Glucose, Bld: 103 mg/dL — ABNORMAL HIGH (ref 70–99)
Potassium: 3.4 mmol/L — ABNORMAL LOW (ref 3.5–5.1)
Sodium: 137 mmol/L (ref 135–145)

## 2023-09-14 SURGERY — PACEMAKER IMPLANT

## 2023-09-14 MED ORDER — LIDOCAINE HCL 1 % IJ SOLN
INTRAMUSCULAR | Status: AC
  Filled 2023-09-14: qty 40

## 2023-09-14 MED ORDER — POTASSIUM CHLORIDE 20 MEQ PO PACK
40.0000 meq | PACK | Freq: Once | ORAL | Status: AC
  Administered 2023-09-14: 40 meq via ORAL
  Filled 2023-09-14: qty 2

## 2023-09-14 MED ORDER — LIDOCAINE HCL 1 % IJ SOLN
INTRAMUSCULAR | Status: AC
  Filled 2023-09-14: qty 20

## 2023-09-14 MED ORDER — SODIUM CHLORIDE 0.9 % IV SOLN
INTRAVENOUS | Status: AC
  Administered 2023-09-14: 80 mg
  Filled 2023-09-14: qty 2

## 2023-09-14 MED ORDER — LIDOCAINE HCL (PF) 1 % IJ SOLN
INTRAMUSCULAR | Status: DC | PRN
  Administered 2023-09-14: 30 mL

## 2023-09-14 MED ORDER — SODIUM CHLORIDE 0.9 % IV SOLN: INTRAVENOUS | Status: DC

## 2023-09-14 MED ORDER — ACETAMINOPHEN 325 MG PO TABS: 325.0000 mg | ORAL_TABLET | ORAL | Status: DC | PRN

## 2023-09-14 MED ORDER — HEPARIN (PORCINE) IN NACL 1000-0.9 UT/500ML-% IV SOLN
INTRAVENOUS | Status: DC | PRN
  Administered 2023-09-14: 500 mL

## 2023-09-14 MED ORDER — VANCOMYCIN HCL IN DEXTROSE 1-5 GM/200ML-% IV SOLN
INTRAVENOUS | Status: AC
  Administered 2023-09-14: 1000 mg via INTRAVENOUS
  Filled 2023-09-14: qty 200

## 2023-09-14 MED ORDER — VANCOMYCIN HCL IN DEXTROSE 1-5 GM/200ML-% IV SOLN
1000.0000 mg | INTRAVENOUS | Status: AC
  Filled 2023-09-14: qty 200

## 2023-09-14 MED ORDER — SODIUM CHLORIDE 0.9 % IV SOLN: 80.0000 mg | INTRAVENOUS | Status: AC

## 2023-09-14 SURGICAL SUPPLY — 13 items
CABLE SURGICAL S-101-97-12 (CABLE) ×1 IMPLANT
CATH CPS LOCATOR 3D MED (CATHETERS) IMPLANT
LEAD ULTIPACE 52 LPA1231/52 (Lead) IMPLANT
LEAD ULTIPACE 65 LPA1231/65 (Lead) IMPLANT
PACEMAKER ASSURITY DR-RF (Pacemaker) IMPLANT
PAD DEFIB RADIO PHYSIO CONN (PAD) ×1 IMPLANT
SHEATH 7FR PRELUDE SNAP 13 (SHEATH) IMPLANT
SHEATH 9FR PRELUDE SNAP 13 (SHEATH) IMPLANT
SHEATH PROBE COVER 6X72 (BAG) IMPLANT
SLITTER AGILIS HISPRO (INSTRUMENTS) IMPLANT
TOOL HELIX LOCKING (MISCELLANEOUS) IMPLANT
TRAY PACEMAKER INSERTION (PACKS) ×1 IMPLANT
WIRE HI TORQ VERSACORE-J 145CM (WIRE) IMPLANT

## 2023-09-14 NOTE — Progress Notes (Addendum)
 Patient Name: ARWA YERO Date of Encounter: 09/14/2023  Primary Cardiologist: Jayson Sierras, MD Electrophysiologist: Will Gladis Norton, MD  Interval Summary   ANNEMARIE. Sleeping this AM. No family at bedside   Vital Signs    Vitals:   09/13/23 1947 09/14/23 0015 09/14/23 0605 09/14/23 0725  BP: (!) 158/61 (!) 126/54 (!) 148/60 126/67  Pulse: (!) 41   (!) 38  Resp: 18 18 18 16   Temp: 98.3 F (36.8 C) 97.9 F (36.6 C) 97.7 F (36.5 C) 98.1 F (36.7 C)  TempSrc: Oral Oral Oral Oral  SpO2: 98% 98% 96% 98%  Weight:      Height:        Intake/Output Summary (Last 24 hours) at 09/14/2023 0754 Last data filed at 09/14/2023 0500 Gross per 24 hour  Intake 240 ml  Output 600 ml  Net -360 ml   Filed Weights   09/12/23 1111 09/13/23 0435  Weight: 59.9 kg 55.5 kg    Physical Exam    GEN- sleeping Lungs- normal work of breathing Cardiac- Regular rate and rhythm, no murmurs, rubs or gallops GI- soft, NT, ND, + BS Extremities- no clubbing or cyanosis. No edema  Telemetry    CHB in 30s (personally reviewed)  Hospital Course    LONETTA BLASSINGAME is a 89 y.o. female with PMH of LBBB, vascular dementia, HTN, HLD, UTI and OSA admitted for CHB  Assessment & Plan    #) CHB Remains in CHB  Off home coreg  without improvement Thyroid  labs normal TTE with hyperdynamic LVEF Will discuss PPM procedure with family when they arrive Clear breakfast this AM in case able to implant PPM today.  #) HTN Permissive HTN allowed Avoid BB and CCB      For questions or updates, please contact [facility] Please consult [email or web address] for contact info under     Signed, Suzann Riddle, NP  09/14/2023, 7:54 AM     I have seen, examined the patient, and reviewed the above assessment and plan.    Interval: No acute overnight events. Patient reports feeling relatively well. No new or acute complaints.   Remains in SR with CHB. Ventricular escape in 30s-40s.   General:  Well developed, in no acute distress.  Neck: No JVD.  Cardiac: Bradycardic, regular rhythm.  Resp: Normal work of breathing.  Ext: No edema.  Psych: Normal affect.   09/13/23 Echo:  1. Left ventricular ejection fraction, by estimation, is >75%. The left  ventricle has hyperdynamic function. The left ventricle has no regional  wall motion abnormalities. There is mild left ventricular hypertrophy.  Left ventricular diastolic parameters  are indeterminate.   2. Right ventricular systolic function is normal. The right ventricular  size is normal.   3. The mitral valve is normal in structure. Trivial mitral valve  regurgitation. No evidence of mitral stenosis.   4. The aortic valve is tricuspid. Aortic valve regurgitation is not  visualized. Aortic valve sclerosis is present, with no evidence of aortic  valve stenosis.   5. The inferior vena cava is normal in size with greater than 50%  respiratory variability, suggesting right atrial pressure of 3 mmHg.   Assessment and Plan:  PAMMIE CHIRINO is a 89 y.o. female with PMH of LBBB, vascular dementia, HTN, HLD, UTI and OSA admitted for CHB.  #. Complete heart block: #. Symptomatic bradycardia: - Patient meets criteria for permanent pacemaker in the setting of symptomatic bradycardia and complete heart block. Explained risks, benefits, and alternatives  to pacemaker implantation to both patient and her daughter, including but not limited to bleeding, infection, damage to heart or lungs, heart attack, stroke, or death.  Pt and daughter both verbalized understanding and want to proceed. - Pacemaker implant today.   Fonda Kitty, MD 09/14/2023 12:07 PM

## 2023-09-14 NOTE — Progress Notes (Signed)
 Patient needs constant reminders to keep Left arm in sling, currently has a dime size blood stain on window dressing. Vitals are stable and and patient denies pain.

## 2023-09-14 NOTE — H&P (View-Only) (Signed)
 Patient Name: Jackie Phelps Date of Encounter: 09/14/2023  Primary Cardiologist: Jayson Sierras, MD Electrophysiologist: Will Gladis Norton, MD  Interval Summary   Jackie Phelps. Sleeping this AM. No family at bedside   Vital Signs    Vitals:   09/13/23 1947 09/14/23 0015 09/14/23 0605 09/14/23 0725  BP: (!) 158/61 (!) 126/54 (!) 148/60 126/67  Pulse: (!) 41   (!) 38  Resp: 18 18 18 16   Temp: 98.3 F (36.8 C) 97.9 F (36.6 C) 97.7 F (36.5 C) 98.1 F (36.7 C)  TempSrc: Oral Oral Oral Oral  SpO2: 98% 98% 96% 98%  Weight:      Height:        Intake/Output Summary (Last 24 hours) at 09/14/2023 0754 Last data filed at 09/14/2023 0500 Gross per 24 hour  Intake 240 ml  Output 600 ml  Net -360 ml   Filed Weights   09/12/23 1111 09/13/23 0435  Weight: 59.9 kg 55.5 kg    Physical Exam    GEN- sleeping Lungs- normal work of breathing Cardiac- Regular rate and rhythm, no murmurs, rubs or gallops GI- soft, NT, ND, + BS Extremities- no clubbing or cyanosis. No edema  Telemetry    CHB in 30s (personally reviewed)  Hospital Course    Jackie Phelps is a 89 y.o. female with PMH of LBBB, vascular dementia, HTN, HLD, UTI and OSA admitted for CHB  Assessment & Plan    #) CHB Remains in CHB  Off home coreg  without improvement Thyroid  labs normal TTE with hyperdynamic LVEF Will discuss PPM procedure with family when they arrive Clear breakfast this AM in case able to implant PPM today.  #) HTN Permissive HTN allowed Avoid BB and CCB      For questions or updates, please contact [facility] Please consult [email or web address] for contact info under     Signed, Suzann Riddle, NP  09/14/2023, 7:54 AM     I have seen, examined the patient, and reviewed the above assessment and plan.    Interval: No acute overnight events. Patient reports feeling relatively well. No new or acute complaints.   Remains in SR with CHB. Ventricular escape in 30s-40s.   General:  Well developed, in no acute distress.  Neck: No JVD.  Cardiac: Bradycardic, regular rhythm.  Resp: Normal work of breathing.  Ext: No edema.  Psych: Normal affect.   09/13/23 Echo:  1. Left ventricular ejection fraction, by estimation, is >75%. The left  ventricle has hyperdynamic function. The left ventricle has no regional  wall motion abnormalities. There is mild left ventricular hypertrophy.  Left ventricular diastolic parameters  are indeterminate.   2. Right ventricular systolic function is normal. The right ventricular  size is normal.   3. The mitral valve is normal in structure. Trivial mitral valve  regurgitation. No evidence of mitral stenosis.   4. The aortic valve is tricuspid. Aortic valve regurgitation is not  visualized. Aortic valve sclerosis is present, with no evidence of aortic  valve stenosis.   5. The inferior vena cava is normal in size with greater than 50%  respiratory variability, suggesting right atrial pressure of 3 mmHg.   Assessment and Plan:  Jackie Phelps is a 89 y.o. female with PMH of LBBB, vascular dementia, HTN, HLD, UTI and OSA admitted for CHB.  #. Complete heart block: #. Symptomatic bradycardia: - Patient meets criteria for permanent pacemaker in the setting of symptomatic bradycardia and complete heart block. Explained risks, benefits, and alternatives  to pacemaker implantation to both patient and her daughter, including but not limited to bleeding, infection, damage to heart or lungs, heart attack, stroke, or death.  Pt and daughter both verbalized understanding and want to proceed. - Pacemaker implant today.   Fonda Kitty, MD 09/14/2023 12:07 PM

## 2023-09-14 NOTE — Progress Notes (Signed)
 HD#1 SUBJECTIVE:  Patient Summary: Jackie Phelps is a 89 y.o. person living with a history of Dementia, hx of LBBB,HTN, HLD, hx urinary bladder malignancy, anxiety, depressionwho presented with SOB and admitted for third degree heart block   Overnight Events: none  Interim History:  Saw pt at bedside this AM. Pt was feeling well. Denied any CP or SOB. She had no acute complaints.   OBJECTIVE:  Vital Signs: Vitals:   09/14/23 0015 09/14/23 0605 09/14/23 0725 09/14/23 0800  BP: (!) 126/54 (!) 148/60 126/67 (!) 142/54  Pulse:   (!) 38 (!) 40  Resp: 18 18 16    Temp: 97.9 F (36.6 C) 97.7 F (36.5 C) 98.1 F (36.7 C) 98.7 F (37.1 C)  TempSrc: Oral Oral Oral Oral  SpO2: 98% 96% 98% 97%  Weight:      Height:       Supplemental O2: Room Air SpO2: 97 %  Filed Weights   09/12/23 1111 09/13/23 0435  Weight: 59.9 kg 55.5 kg     Intake/Output Summary (Last 24 hours) at 09/14/2023 1144 Last data filed at 09/14/2023 0500 Gross per 24 hour  Intake --  Output 600 ml  Net -600 ml   Net IO Since Admission: -360 mL [09/14/23 1144]  Physical Exam: Physical Exam HENT:     Head: Normocephalic.  Cardiovascular:     Rate and Rhythm: Regular rhythm. Bradycardia present.     Heart sounds: Normal heart sounds.  Pulmonary:     Effort: Pulmonary effort is normal.     Breath sounds: Normal breath sounds.  Skin:    General: Skin is warm.  Neurological:     Mental Status: She is alert.  Psychiatric:        Mood and Affect: Mood normal.     Patient Lines/Drains/Airways Status     Active Line/Drains/Airways     Name Placement date Placement time Site Days   Peripheral IV 09/12/23 20 G 1 Anterior;Distal;Left;Upper Arm 09/12/23  1022  Arm  2   Peripheral IV 09/12/23 20 G 1 Right Antecubital 09/12/23  1123  Antecubital  2            Pertinent labs and imaging:      Latest Ref Rng & Units 09/13/2023    1:50 AM 09/12/2023   11:03 AM 08/05/2023    2:35 PM  CBC  WBC 4.0 -  10.5 K/uL 7.8  7.7  6.7   Hemoglobin 12.0 - 15.0 g/dL 86.1  85.9  86.4   Hematocrit 36.0 - 46.0 % 40.8  42.6  40.8   Platelets 150 - 400 K/uL 175  180  213        Latest Ref Rng & Units 09/14/2023    3:55 AM 09/13/2023    1:50 AM 09/12/2023   11:03 AM  CMP  Glucose 70 - 99 mg/dL 896  887  865   BUN 8 - 23 mg/dL 28  23  22    Creatinine 0.44 - 1.00 mg/dL 8.60  8.72  8.51   Sodium 135 - 145 mmol/L 137  138  140   Potassium 3.5 - 5.1 mmol/L 3.4  3.6  3.3   Chloride 98 - 111 mmol/L 102  104  102   CO2 22 - 32 mmol/L 24  21  26    Calcium  8.9 - 10.3 mg/dL 8.4  9.0  9.2     No results found.  ASSESSMENT/PLAN:  Assessment: Principal Problem:   Third degree heart  block [facility]) Active Problems:   Systolic essential hypertension   Dementia without behavioral disturbance (HCC)   History of recurrent UTIs   Acute kidney injury (HCC)   Plan: SOB Pt's episode of SOB before admission was likely 2/2 to her third degree heart block. No improvement in HR after Coreg  and Aricept  were held. Will undergo PPM implant today. -PPM implant today -Carvedilol  and Aricept  on hold -on telemetry    HTN BP this AM 148/60. Stopped hydrochlorothiazide  due to fluctuations in potassium levels.    -continue irbesartan  75 mg -IV hydralazine  PRN if SBP> 170 -stopped hydrochlorothiazide  -continue Amlodipine  10mg    Dementia  PT evaluation was not completed as pt was to get PPM implant today. Will resume after the PPM implant today.   -PT evaluation held due to PPM implantation  -Home Aricept  stopped   Chronic Conditions   UTI Pt asymptomatic.    HLD On home simvastatin  20 mg -continue simvastatin  20 mg     Urinary Bladder Malignancy Per daughter, her last dose of cisplatin was last year.    Anxiety Takes Lexapro  5mg  at home  Best Practice: Diet: NPO VTE: None IVF: None,None Code: Full   Disposition planning: Prior to Admission Living Arrangement: Home, living   Anticipated Discharge  Location: Home   Dispo: Admit patient to Observation with expected length of stay less than 2 midnights.  Signature:  Rebecka Edgardo [facility]  11:44 AM, 09/14/2023  On Call pager [phone]

## 2023-09-14 NOTE — Interval H&P Note (Signed)
 History and Physical Interval Note:  09/14/2023 12:25 PM  Jackie Phelps  has presented today for surgery, with the diagnosis of symptomatic bradycardia and complete heart block.  The various methods of treatment have been discussed with the patient and family. After consideration of risks, benefits and other options for treatment, the patient has consented to  Procedure(s): PACEMAKER IMPLANT (N/A) as a surgical intervention.  The patient's history has been reviewed, patient examined, no change in status, stable for surgery.  I have reviewed the patient's chart and labs.  Questions were answered to the patient's satisfaction.     Fonda Kitty

## 2023-09-14 NOTE — Discharge Instructions (Signed)
 To Jackie Phelps or their caretakers,  You were recently admitted to [facility] for complete heart block.   Continue taking your home medications with the following changes:  Start taking Amlodipine  10 mg Irbesartan  75 mg Stop taking Hydrochlorothiazide   Continue taking All other Home meds 4.    Pause taking  A. Aricept : until PCP clears you to take it   Please follow up with the following doctors/specialties: PCP within one week   We recommend that you also see your primary care doctor in about a week to make sure that you continue to improve. We are so glad that you are feeling better.  Sincerely,  [facility]   After Your Pacemaker   You have a Abbott Pacemaker  If you have a Medtronic or Biotronik device, plug in your home monitor once you get home, and no manual interaction is required.   If you have an [employer] or [employer] device, plug your home monitor once you get home, sit near the device, and press the large activation button. Sit nearby until the process is complete, usually notated by lights on the monitor.   If you were set up for monitoring using an app on your phone, make sure the app remains open in the background and the Bluetooth remains on.  ACTIVITY Do not lift your arm above shoulder height for 1 week after your procedure. After 7 days, you may progress as below.  You should remove your sling 24 hours after your procedure, unless otherwise instructed by your provider.     Monday September 21, 2023  Tuesday September 22, 2023 Wednesday September 23, 2023 Thursday September 24, 2023   Do not lift, push, pull, or carry anything over 10 pounds with the affected arm until 6 weeks (Monday October 26, 2023 ) after your procedure.   You may drive AFTER your wound check, unless you have been told otherwise by your provider.   Ask your healthcare provider when you can go back to work   INCISION/Dressing If you are on  a blood thinner such as Coumadin, Xarelto, Eliquis, Plavix, or Pradaxa please confirm with your provider when this should be resumed.   If large square, outer bandage is left in place, this can be removed after 24 hours from your procedure. Do not remove steri-strips or glue as below.   If a PRESSURE DRESSING (a bulky dressing that usually goes up over your shoulder) was applied or left in place, please follow instructions given by your provider on when to return to have this removed.   Monitor your Pacemaker site for redness, swelling, and drainage. Call the device clinic at [phone] if you experience these symptoms or fever/chills.  If your incision is sealed with Steri-strips or staples, you may shower 7 days after your procedure or when told by your provider. Do not remove the steri-strips or let the shower hit directly on your site. You may wash around your site with soap and water .    If you were discharged in a sling, please do not wear this during the day more than 48 hours after your surgery unless otherwise instructed. This may increase the risk of stiffness and soreness in your shoulder.   Avoid lotions, ointments, or perfumes over your incision until it is well-healed.  You may use a hot tub or a pool AFTER your wound check appointment if the incision is completely closed.  Pacemaker Alerts:  Some alerts are vibratory and others  beep. These are NOT emergencies. Please call our office to let us  know. If this occurs at night or on weekends, it can wait until the next business day. Send a remote transmission.  If your device is capable of reading fluid status (for heart failure), you will be offered monthly monitoring to review this with you.   DEVICE MANAGEMENT Remote monitoring is used to monitor your pacemaker from home. This monitoring is scheduled every 91 days by our office. It allows us  to keep an eye on the functioning of your device to ensure it is working properly. You  will routinely see your Electrophysiologist annually (more often if necessary).  This will appear as a REMOTE check on your MyChart schedule. These are automatic and there is nothing for you to manually do unless otherwise instructed.  You should receive your ID card for your new device in 4-8 weeks. Keep this card with you at all times once received. Consider wearing a medical alert bracelet or necklace.  Your Pacemaker may be MRI compatible. This will be discussed at your next office visit/wound check.  You should avoid contact with strong electric or magnetic fields.   Do not use amateur (ham) radio equipment or electric (arc) welding torches. MP3 player headphones with magnets should not be used. Some devices are safe to use if held at least 12 inches (30 cm) from your Pacemaker. These include power tools, lawn mowers, and speakers. If you are unsure if something is safe to use, ask your health care provider.  When using your cell phone, hold it to the ear that is on the opposite side from the Pacemaker. Do not leave your cell phone in a pocket over the Pacemaker.  You may safely use electric blankets, heating pads, computers, and microwave ovens.  Call the office right away if: You have chest pain. You feel more short of breath than you have felt before. You feel more light-headed than you have felt before. Your incision starts to open up.  This information is not intended to replace advice given to you by your health care provider. Make sure you discuss any questions you have with your health care provider.

## 2023-09-14 NOTE — Plan of Care (Signed)
  Problem: Education: Goal: Knowledge of General Education information will improve Description: Including pain rating scale, medication(s)/side effects and non-pharmacologic comfort measures Outcome: Progressing   Problem: Health Behavior/Discharge Planning: Goal: Ability to manage health-related needs will improve Outcome: Progressing   Problem: Clinical Measurements: Goal: Ability to maintain clinical measurements within normal limits will improve Outcome: Progressing Goal: Will remain free from infection Outcome: Progressing Goal: Diagnostic test results will improve Outcome: Progressing Goal: Respiratory complications will improve Outcome: Progressing Goal: Cardiovascular complication will be avoided Outcome: Progressing   Problem: Activity: Goal: Risk for activity intolerance will decrease Outcome: Progressing   Problem: Nutrition: Goal: Adequate nutrition will be maintained Outcome: Progressing   Problem: Coping: Goal: Level of anxiety will decrease Outcome: Progressing   Problem: Elimination: Goal: Will not experience complications related to bowel motility Outcome: Progressing Goal: Will not experience complications related to urinary retention Outcome: Progressing   Problem: Pain Managment: Goal: General experience of comfort will improve and/or be controlled Outcome: Progressing   Problem: Safety: Goal: Ability to remain free from injury will improve Outcome: Progressing   Problem: Skin Integrity: Goal: Risk for impaired skin integrity will decrease Outcome: Progressing   Problem: Education: Goal: Knowledge of cardiac device and self-care will improve Outcome: Progressing Goal: Ability to safely manage health related needs after discharge will improve Outcome: Progressing Goal: Individualized Educational Video(s) Outcome: Progressing   Problem: Cardiac: Goal: Ability to achieve and maintain adequate cardiopulmonary perfusion will  improve Outcome: Progressing New Pacemaker Placed

## 2023-09-14 NOTE — Progress Notes (Signed)
 Patient in Procedure area, Daughter made aware, off Tele and CCMD made aware.

## 2023-09-14 NOTE — Progress Notes (Signed)
 PT Cancellation Note  Patient Details Name: ERMELINDA ECKERT MRN: [id number] DOB: 1931-08-12   Cancelled Treatment:    Reason Eval/Treat Not Completed: Patient not medically ready (pt with heart block and await PPM prior to initiating therapy)   Dawsyn Ramsaran B Jasilyn Holderman 09/14/2023, 10:35 AM Lenoard SQUIBB, PT Acute Rehabilitation Services Office: [phone]

## 2023-09-15 ENCOUNTER — Other Ambulatory Visit (HOSPITAL_COMMUNITY): Payer: Self-pay

## 2023-09-15 ENCOUNTER — Inpatient Hospital Stay (HOSPITAL_COMMUNITY)

## 2023-09-15 ENCOUNTER — Encounter (HOSPITAL_COMMUNITY): Payer: Self-pay | Admitting: Cardiology

## 2023-09-15 DIAGNOSIS — Z950 Presence of cardiac pacemaker: Secondary | ICD-10-CM

## 2023-09-15 LAB — CBC
HCT: 42.7 % (ref 36.0–46.0)
Hemoglobin: 14.3 g/dL (ref 12.0–15.0)
MCH: 29.4 pg (ref 26.0–34.0)
MCHC: 33.5 g/dL (ref 30.0–36.0)
MCV: 87.9 fL (ref 80.0–100.0)
Platelets: 164 K/uL (ref 150–400)
RBC: 4.86 MIL/uL (ref 3.87–5.11)
RDW: 13.2 % (ref 11.5–15.5)
WBC: 7.5 K/uL (ref 4.0–10.5)
nRBC: 0 % (ref 0.0–0.2)

## 2023-09-15 LAB — BASIC METABOLIC PANEL WITH GFR
Anion gap: 14 (ref 5–15)
BUN: 25 mg/dL — ABNORMAL HIGH (ref 8–23)
CO2: 19 mmol/L — ABNORMAL LOW (ref 22–32)
Calcium: 8.8 mg/dL — ABNORMAL LOW (ref 8.9–10.3)
Chloride: 104 mmol/L (ref 98–111)
Creatinine, Ser: 1.28 mg/dL — ABNORMAL HIGH (ref 0.44–1.00)
GFR, Estimated: 40 mL/min — ABNORMAL LOW (ref >60)
Glucose, Bld: 149 mg/dL — ABNORMAL HIGH (ref 70–99)
Potassium: 3.8 mmol/L (ref 3.5–5.1)
Sodium: 137 mmol/L (ref 135–145)

## 2023-09-15 LAB — URINE CULTURE: Culture: >=100000 — AB

## 2023-09-15 MED ORDER — ATORVASTATIN CALCIUM 10 MG PO TABS
10.0000 mg | ORAL_TABLET | Freq: Every day | ORAL | Status: DC
  Administered 2023-09-15: 10 mg via ORAL
  Filled 2023-09-15: qty 1

## 2023-09-15 MED ORDER — AMLODIPINE BESYLATE 10 MG PO TABS
10.0000 mg | ORAL_TABLET | Freq: Every day | ORAL | 0 refills | Status: AC
  Filled 2023-09-15: qty 30, 30d supply, fill #0

## 2023-09-15 MED ORDER — IRBESARTAN 75 MG PO TABS
75.0000 mg | ORAL_TABLET | Freq: Every day | ORAL | 0 refills | Status: AC
  Filled 2023-09-15: qty 30, 30d supply, fill #0

## 2023-09-15 MED FILL — Lidocaine HCl Local Inj 1%: INTRAMUSCULAR | Qty: 20 | Status: AC

## 2023-09-15 MED FILL — Lidocaine HCl Local Inj 1%: INTRAMUSCULAR | Qty: 30 | Status: AC

## 2023-09-15 NOTE — Evaluation (Addendum)
 Physical Therapy Evaluation Patient Details Name: Jackie Phelps MRN: [id number] DOB: 02/02/1931 Today's Date: 09/15/2023  History of Present Illness  89 yo female adm 09/12/23 with 3rd degree heart block. 9/8 PPM placed. PMHx: dementia, anxiety, HLD, HTN, LBBB  Clinical Impression  Pt very pleasant with extremely limited STM grossly 2 min. Pt repeatedly educated for precautions and PPM throughout session without demonstration of carryover. Sling donned and maintained throughout session due to cognitive impairment. Pt states she lives with spouse but per chart with children. Pt requires 24hr supervision and assist for cognition and to maintain precautions at this time. Pt with decreased strength, function and transfers who will benefit from acute therapy to maximize mobility and safety.  HHPt recommended  HR 77-115        If plan is discharge home, recommend the following: A little help with walking and/or transfers;A little help with bathing/dressing/bathroom;Assistance with cooking/housework;Direct supervision/assist for financial management;Supervision due to cognitive status;Assist for transportation;Help with stairs or ramp for entrance   Can travel by private vehicle        Equipment Recommendations BSC/3in1  Recommendations for Other Services  OT consult    Functional Status Assessment Patient has had a recent decline in their functional status and demonstrates the ability to make significant improvements in function in a reasonable and predictable amount of time.     Precautions / Restrictions Precautions Precautions: Fall;ICD/Pacemaker Recall of Precautions/Restrictions: Impaired Precaution/Restrictions Comments: repetitive education grossly every few minutes during session      Mobility  Bed Mobility Overal bed mobility: Needs Assistance Bed Mobility: Supine to Sit     Supine to sit: HOB elevated, Mod assist     General bed mobility comments: mod assist to pivot to  EOB with HOB 40 degrees and max cues not to use LUE. assist with pad to move pelvis forward. Placed LUE in sling EOB as pt unable to recall precautions even for a few minutes at a time    Transfers Overall transfer level: Needs assistance   Transfers: Sit to/from Stand Sit to Stand: Min assist           General transfer comment: min assist to rise and steady with RUE support    Ambulation/Gait Ambulation/Gait assistance: Min assist Gait Distance (Feet): 110 Feet Assistive device: 1 person hand held assist Gait Pattern/deviations: Step-through pattern, Decreased stride length   Gait velocity interpretation: 1.31 - 2.62 ft/sec, indicative of limited community ambulator   General Gait Details: pt with fatigue during gait with progressive cues and assist, min assist for balance and direction  Stairs            Wheelchair Mobility     Tilt Bed    Modified Rankin (Stroke Patients Only)       Balance Overall balance assessment: Needs assistance Sitting-balance support: No upper extremity supported, Feet supported Sitting balance-Leahy Scale: Fair     Standing balance support: Single extremity supported, During functional activity Standing balance-Leahy Scale: Poor Standing balance comment: hand held assist during gait                             Pertinent Vitals/Pain Pain Assessment Pain Assessment: No/denies pain    Home Living Family/patient expects to be discharged to:: Private residence Living Arrangements: Children Available Help at Discharge: Family;Available 24 hours/day Type of Home: House Home Access: Stairs to enter     Alternate Level Stairs-Number of Steps: 14 Home Layout: Multi-level;Bed/bath  upstairs Home Equipment: Agricultural consultant (2 wheels);Tub bench Additional Comments: home setup and PLOF from prior admission as pt unreliable and family not present    Prior Function Prior Level of Function : Needs assist              Mobility Comments: household and short distanced community ambulation without AD ADLs Comments: Assisted by family     Extremity/Trunk Assessment   Upper Extremity Assessment Upper Extremity Assessment: Generalized weakness    Lower Extremity Assessment Lower Extremity Assessment: Generalized weakness    Cervical / Trunk Assessment Cervical / Trunk Assessment: Normal  Communication   Communication Communication: No apparent difficulties    Cognition Arousal: Alert Behavior During Therapy: WFL for tasks assessed/performed   PT - Cognitive impairments: History of cognitive impairments, No family/caregiver present to determine baseline, Orientation, Awareness, Memory, Safety/Judgement, Problem solving   Orientation impairments: Place, Time, Situation                     Following commands: Intact       Cueing Cueing Techniques: Verbal cues, Gestural cues     General Comments      Exercises     Assessment/Plan    PT Assessment Patient needs continued PT services  PT Problem List Decreased coordination;Decreased activity tolerance;Decreased balance;Decreased mobility;Decreased knowledge of precautions;Decreased safety awareness;Decreased cognition       PT Treatment Interventions DME instruction;Gait training;Stair training;Therapeutic exercise;Therapeutic activities;Functional mobility training;Patient/family education    PT Goals (Current goals can be found in the Care Plan section)  Acute Rehab PT Goals Patient Stated Goal: return home PT Goal Formulation: Patient unable to participate in goal setting Time For Goal Achievement: 09/29/23 Potential to Achieve Goals: Fair    Frequency Min 2X/week     Co-evaluation               AM-PAC PT 6 Clicks Mobility  Outcome Measure Help needed turning from your back to your side while in a flat bed without using bedrails?: A Little Help needed moving from lying on your back to sitting on the side  of a flat bed without using bedrails?: A Little Help needed moving to and from a bed to a chair (including a wheelchair)?: A Little Help needed standing up from a chair using your arms (e.g., wheelchair or bedside chair)?: A Little Help needed to walk in hospital room?: A Little Help needed climbing 3-5 steps with a railing? : A Lot 6 Click Score: 17    End of Session Equipment Utilized During Treatment: Other (comment) (LUE sling) Activity Tolerance: Patient tolerated treatment well Patient left: in chair;with call bell/phone within reach;with chair alarm set Nurse Communication: Mobility status;Precautions PT Visit Diagnosis: Other abnormalities of gait and mobility (R26.89);Difficulty in walking, not elsewhere classified (R26.2)    Time: 9268-9249 PT Time Calculation (min) (ACUTE ONLY): 19 min   Charges:   PT Evaluation $PT Eval Moderate Complexity: 1 Mod   PT General Charges $$ ACUTE PT VISIT: 1 Visit         Lenoard SQUIBB, PT Acute Rehabilitation Services Office: [phone]   Lenoard NOVAK Mckinnon Glick 09/15/2023, 7:57 AM

## 2023-09-15 NOTE — Progress Notes (Addendum)
 Patient Name: Jackie Phelps Date of Encounter: 09/15/2023  Primary Cardiologist: Jayson Sierras, MD Electrophysiologist: Soyla Gladis Norton, MD  Interval Summary   S/p dual chamber PPM implant yesterday, no events overnight  Vital Signs    Vitals:   09/15/23 0000 09/15/23 0408 09/15/23 0716 09/15/23 0752  BP: (!) 143/62  (!) 172/95   Pulse: 75  79 77  Resp: 18 18 18    Temp: 98 F (36.7 C) 98.2 F (36.8 C) 97.7 F (36.5 C)   TempSrc: Oral Oral Oral   SpO2: 94%  95%   Weight:  60.4 kg    Height:        Intake/Output Summary (Last 24 hours) at 09/15/2023 0837 Last data filed at 09/15/2023 0424 Gross per 24 hour  Intake 610.87 ml  Output 950 ml  Net -339.13 ml   Filed Weights   09/12/23 1111 09/13/23 0435 09/15/23 0408  Weight: 59.9 kg 55.5 kg 60.4 kg    Physical Exam    GEN- NAD Lungs- normal work of breathing Cardiac- Regular rate and rhythm, no murmurs, rubs or gallops GI- soft, NT, ND, + BS Extremities- no clubbing or cyanosis. No edema  Telemetry    AS-VP in 60-70s (personally reviewed)  Hospital Course    Jackie Phelps is a 89 y.o. female with PMH of LBBB, vascular dementia, HTN, HLD, UTI and OSA admitted for CHB  Assessment & Plan    #) CHB #) s/p PPM implant S/p [employer] dual chamber PPM implant yesterday Interrogation this morning done CXR without pneumo, minimal slack on atrial lead EKG with appropriate VP  Teaching done to patient and daughter Outpatient appts scheduled, instructions in AVS   OK to discharge from EP perspective     For questions or updates, please contact [facility] Please consult [email or web address] for contact info under     Signed, Suzann Riddle, NP  09/15/2023, 8:37 AM    I have seen, examined the patient, and reviewed the above assessment and plan.    Interval:  Dual chamber pacemaker implanted yesterday. No acute overnight events. Patient reports feeling relatively well. No new or acute complaints.    General: Well developed, in no acute distress.  Neck: No JVD.  Cardiac: Normal rate, regular rhythm. Left chest pacer pocket without hematoma or bleeding.  Resp: Normal work of breathing.  Ext: No edema.  Neuro: No gross focal deficits.  Psych: Normal affect.   09/13/23 Echo:  1. Left ventricular ejection fraction, by estimation, is >75%. The left  ventricle has hyperdynamic function. The left ventricle has no regional  wall motion abnormalities. There is mild left ventricular hypertrophy.  Left ventricular diastolic parameters  are indeterminate.   2. Right ventricular systolic function is normal. The right ventricular  size is normal.   3. The mitral valve is normal in structure. Trivial mitral valve  regurgitation. No evidence of mitral stenosis.   4. The aortic valve is tricuspid. Aortic valve regurgitation is not  visualized. Aortic valve sclerosis is present, with no evidence of aortic  valve stenosis.   5. The inferior vena cava is normal in size with greater than 50%  respiratory variability, suggesting right atrial pressure of 3 mmHg.    Assessment and Plan:  Jackie Phelps is a 89 y.o. female with PMH of LBBB, vascular dementia, HTN, HLD, UTI and OSA admitted for CHB.   #. Complete heart block: #. Symptomatic bradycardia: - CXR with appropriate lead position. Minimal slack on RA  lead. No PTX. - Bedside device interrogation with appropriate device function and stable lead parameters.  - Usual post-implant discharge teaching provided regarding activity restrictions and wound care. Given her dementia, stressed importance of left upper extremity limited mobility in the acute phase, next 6 weeks. - Return to device clinic in 10-14 days.     Fonda Kitty, MD 09/15/2023 10:30 PM

## 2023-09-15 NOTE — Plan of Care (Signed)
  Problem: Clinical Measurements: Goal: Will remain free from infection Outcome: Progressing Goal: Respiratory complications will improve Outcome: Progressing Goal: Cardiovascular complication will be avoided Outcome: Progressing   Problem: Elimination: Goal: Will not experience complications related to urinary retention Outcome: Progressing   Problem: Pain Managment: Goal: General experience of comfort will improve and/or be controlled Outcome: Progressing   Problem: Safety: Goal: Ability to remain free from injury will improve Outcome: Progressing   Problem: Cardiac: Goal: Ability to achieve and maintain adequate cardiopulmonary perfusion will improve Outcome: Progressing

## 2023-09-15 NOTE — TOC Initial Note (Signed)
 Transition of Care [facility]) - Initial/Assessment Note    Patient Details  Name: Jackie Phelps MRN: [id number] Date of Birth: 1931/10/30  Transition of Care [facility]) CM/SW Contact:    Waddell Barnie Rama, RN Phone Number: 09/15/2023, 10:45 AM  Clinical Narrative:                 From home with daughter, Damien, has PCP and insurance on file, states has no HH services in place at this time , has DME that was her spouse, a walker, a regualr bsc and a bariatric bsc at home.  States family member will transport them home at [employer] and family is support system, states gets medications from [facility].  Pta self ambulatory.   Per PT eval rec HHPT,  NCM offered choice, daughter states she really wish they could have [facility] but they do not take BCBS,  so she chose [facility].  NCM made referral to [facility] , she is able to take referral.  Soc will begin 24 to 48 hrs post dc.   Expected Discharge Plan: Home w Home Health Services Barriers to Discharge: Continued Medical Work up   Patient Goals and CMS Choice Patient states their goals for this hospitalization and ongoing recovery are:: return home with daughgter CMS Medicare.gov Compare Post Acute Care list provided to:: Patient Represenative (must comment) Choice offered to / list presented to : Adult Children      Expected Discharge Plan and Services In-house Referral: NA Discharge Planning Services: CM Consult Post Acute Care Choice: Home Health Living arrangements for the past 2 months: Single Family Home                   DME Agency: NA       HH Arranged: PT HH Agency: [facility] Date HH Agency Contacted: 09/15/23 Time HH Agency Contacted: 1044 Representative spoke with at [facility] Agency: [facility]  Prior Living Arrangements/Services Living arrangements for the past 2 months: Single Family Home Lives with:: Adult Children Patient language and need for interpreter reviewed:: Yes Do you feel safe going back to the place  where you live?: Yes      Need for Family Participation in Patient Care: Yes (Comment) Care giver support system in place?: Yes (comment) Current home services: DME (has her spouse walker and bsc (regular one and a bariatric one)) Criminal Activity/Legal Involvement Pertinent to Current Situation/Hospitalization: No - Comment as needed  Activities of Daily Living   ADL Screening (condition at time of admission) Independently performs ADLs?: Yes (appropriate for developmental age) Is the patient deaf or have difficulty hearing?: No Does the patient have difficulty seeing, even when wearing glasses/contacts?: No Does the patient have difficulty concentrating, remembering, or making decisions?: No  Permission Sought/Granted Permission sought to share information with : Case Manager Permission granted to share information with : Yes, Verbal Permission Granted  Share Information with NAME: daughter  Permission granted to share info w AGENCY: HH        Emotional Assessment Appearance:: Appears stated age Attitude/Demeanor/Rapport: Engaged Affect (typically observed): Appropriate Orientation: : Oriented to Self, Oriented to Place, Oriented to  Time, Oriented to Situation Alcohol / Substance Use: Not Applicable Psych Involvement: No (comment)  Admission diagnosis:  Complete heart block (HCC) [I44.2] Third degree heart block (HCC) [I44.2] Patient Active Problem List   Diagnosis Date Noted   History of recurrent UTIs 09/13/2023   Acute kidney injury (HCC) 09/13/2023   Third degree heart block (HCC) 09/12/2023  UTI (urinary tract infection) 06/07/2023   Generalized weakness 06/07/2023   Hypokalemia 06/07/2023   Prolonged QT interval 06/07/2023   Dementia without behavioral disturbance (HCC) 06/07/2023   Anxiety and depression 06/07/2023   Other fracture of right femur, initial encounter for closed fracture (HCC) 01/27/2020   Malignant neoplasm of urinary bladder (HCC) 05/25/2019    Vitamin D  deficiency disease 10/26/2018   Viral warts 10/26/2018   Mixed hyperlipidemia 10/26/2018   Iron deficiency anemia 01/22/2014   Anxiety state 01/16/2014   Cough 01/16/2014   Left displaced femoral neck fracture (HCC) 01/08/2014   Chronic constipation 02/03/2011   Systolic essential hypertension 02/03/2011   PCP:  Glover Lenis, MD Pharmacy:   [facility] - [place], [place] - [address] [place] [address] Phone: [phone] Fax: [phone]     Social Drivers of Health (SDOH) Social History: SDOH Screenings   Food Insecurity: No Food Insecurity (09/12/2023)  Housing: Low Risk  (09/12/2023)  Transportation Needs: No Transportation Needs (09/12/2023)  Utilities: Not At Risk (09/12/2023)  Depression (PHQ2-9): Low Risk  (12/19/2019)  Financial Resource Strain: Low Risk  (06/03/2023)   Received from [facility]  Social Connections: Moderately Integrated (09/12/2023)  Tobacco Use: Medium Risk (09/12/2023)   SDOH Interventions:     Readmission Risk Interventions    09/15/2023   10:42 AM 06/08/2023   11:36 AM  Readmission Risk Prevention Plan  Transportation Screening Complete Complete  PCP or Specialist Appt within 3-5 Days Complete   HRI or Home Care Consult Complete Complete  Social Work Consult for Recovery Care Planning/Counseling  Complete  Palliative Care Screening Not Applicable Not Applicable  Medication Review Oceanographer) Complete Complete

## 2023-09-15 NOTE — TOC Transition Note (Signed)
 Transition of Care [facility]) - Discharge Note   Patient Details  Name: Jackie Phelps MRN: [id number] Date of Birth: 22-Oct-1931  Transition of Care [facility]) CM/SW Contact:  Waddell Barnie Rama, RN Phone Number: 09/15/2023, 11:56 AM   Clinical Narrative:    For dc today, daughter is at the bedside to transport her home.   Final next level of care: Home w Home Health Services Barriers to Discharge: Continued Medical Work up   Patient Goals and CMS Choice Patient states their goals for this hospitalization and ongoing recovery are:: return home with daughgter CMS Medicare.gov Compare Post Acute Care list provided to:: Patient Represenative (must comment) Choice offered to / list presented to : Adult Children      Discharge Placement                       Discharge Plan and Services Additional resources added to the After Visit Summary for   In-house Referral: NA Discharge Planning Services: CM Consult Post Acute Care Choice: Home Health            DME Agency: NA       HH Arranged: PT HH Agency: [facility] Date HH Agency Contacted: 09/15/23 Time HH Agency Contacted: 1044 Representative spoke with at [facility] Agency: [facility]  Social Drivers of Health (SDOH) Interventions SDOH Screenings   Food Insecurity: No Food Insecurity (09/12/2023)  Housing: Low Risk  (09/12/2023)  Transportation Needs: No Transportation Needs (09/12/2023)  Utilities: Not At Risk (09/12/2023)  Depression (PHQ2-9): Low Risk  (12/19/2019)  Financial Resource Strain: Low Risk  (06/03/2023)   Received from [facility]  Social Connections: Moderately Integrated (09/12/2023)  Tobacco Use: Medium Risk (09/12/2023)     Readmission Risk Interventions    09/15/2023   10:42 AM 06/08/2023   11:36 AM  Readmission Risk Prevention Plan  Transportation Screening Complete Complete  PCP or Specialist Appt within 3-5 Days Complete   HRI or Home Care Consult Complete Complete   Social Work Consult for Recovery Care Planning/Counseling  Complete  Palliative Care Screening Not Applicable Not Applicable  Medication Review Oceanographer) Complete Complete

## 2023-09-15 NOTE — Discharge Summary (Signed)
 Name: Jackie Phelps MRN: [id number] DOB: July 29, 1931 89 y.o. PCP: Jackie Lenis, MD  Date of Admission: 09/12/2023 10:52 AM Date of Discharge: 09/15/2023 Attending Physician: Dr. Dayton Phelps  Discharge Diagnosis: 1. Principal Problem:   Third degree heart block (HCC) Active Problems:   Systolic essential hypertension   Dementia without behavioral disturbance (HCC)   History of recurrent UTIs   Acute kidney injury [facility])   Discharge Medications: Allergies as of 09/15/2023       Reactions   Penicillins Hives, Dermatitis, Rash   Patient tolerated Ancef  without any complications.        Medication List     PAUSE taking these medications    donepezil  10 MG tablet Wait to take this until your doctor or other care provider tells you to start again. Commonly known as: ARICEPT  Take 10 mg by mouth daily.       STOP taking these medications    hydrochlorothiazide  12.5 MG tablet Commonly known as: HYDRODIURIL        TAKE these medications    amLODipine  10 MG tablet Commonly known as: NORVASC  Take 1 tablet (10 mg total) by mouth daily. Start taking on: September 16, 2023   aspirin  EC 81 MG tablet Take 1 tablet by mouth daily.   carvedilol  6.25 MG tablet Commonly known as: COREG  Take 2 tablets (12.5 mg total) by mouth 2 (two) times daily. What changed:  how much to take when to take this   escitalopram  5 MG tablet Commonly known as: LEXAPRO  Take 1 tablet (5 mg total) by mouth daily.   irbesartan  75 MG tablet Commonly known as: AVAPRO  Take 1 tablet (75 mg total) by mouth daily. Start taking on: September 16, 2023   memantine  10 MG tablet Commonly known as: NAMENDA  Take 10 mg by mouth 2 (two) times daily.   nitrofurantoin (macrocrystal-monohydrate) 100 MG capsule Commonly known as: MACROBID Take 100 mg by mouth 2 (two) times daily. For 5 days   potassium chloride  SA 20 MEQ tablet Commonly known as: KLOR-CON  M Take 40 mEq by mouth daily.    PROBIOTIC ADVANCED PO Take 1 capsule by mouth at bedtime.   simvastatin  20 MG tablet Commonly known as: ZOCOR  Take 1 tablet (20 mg total) by mouth daily.   vitamin B-12 500 MCG tablet Commonly known as: CYANOCOBALAMIN Take 500 mcg by mouth daily.        Disposition and follow-up:   Jackie Phelps was discharged from [facility] in Good condition.  At the hospital follow up visit please address:  1.  IF pt has any SOB, CP.   2.  Labs / imaging needed at time of follow-up: BMP  3.  Pending labs/ test needing follow-up: none  Follow-up Appointments:  Follow-up Information     [facility] Follow [facility].   Specialty: Home Health Services Why: [facility]- Agency will call you to set up apt times [phone]                 Hospital Course by problem list: Jackie Phelps is a 89 y.o. person living with a history of  Dementia,  hx of LBBB,HTN, HLD, hx urinary bladder malignancy, anxiety, depressionwho presented with SOB and admitted for third degree heart block on hospital day 0   Third degree Heart Block Pt was brought in by EMS after daughter found her to have SOB. On admission, she was not in respiratory distress, was afebrile with a negative CXR  for an acute process. HR was 40 and EKG showed third-degree heart block. IP cardiology was on board and believed her HR was likely due to CHB exacerbated by the use of home Coreg  and Aricept . There was no improvement in HR after holding Aricept  and Coreg  for a day. EP recommended pt to get a PPM implant which was completed on 09/14/2023. Pt tolerated the procedure well. Pt's HR on discharge was 75 and she looked stable on her PE with no acute complaints.      HTN BP on admission 156/82. Home hydrochlorothiazide  and coreg  were held. Started her on amlodipine  10 mg for increased BP. Cardiology started pt on IV hydralazine  PRN and PO irbesartan  75 mg as she had elevated SBP greater than 200s.  BP on discharge was 142/72. Asked her to continue Amlodipine  10mg , irbesartan  75 mg, and stop hydrochlorothiazide .     Dementia  On admission, pt had poor memory on PE--was oriented to self only not to place or year. Daughter mentioned she has a hx of dementia for several years now and per charting she has vascular dementia. Aricept  was stopped due to her low HR on admission. Pt appeared well on discharge and was accompanied by her daughter. She denied any acute complaints. Asked her to pause aricept  till PCP clears her to use it. Continue home memantine .     UTI UA from 09/08/23 was positive for nitrites and some bacteria. Pt was taking macrodantin for it. She did not endorse any urinary symptoms during her admission. Can continue taking it if she has symtoms as urine cx grew E. coli.    HLD Home simvastatin  20 mg changed to atorvastatin  10 mg IP due to adverse drug effects with Amlodipine  with the higher dose statin. Sent her home with home simvastatin  20 mg. PCP to consider changing home statin to atorvastatin  for adverse drug affects, if pt requires.    Urinary Bladder Malignancy Per daughter, her last dose of cisplatin was last year.    Anxiety Takes Lexapro  5mg  at home     Subjective Saw pt at bedside this AM. Patient feeling well this morning and denies CP and SOB. She was stable and ready for discharge after receiving pacemaker education and cardiology recommendations regarding other medication changes.  Discharge Exam:   BP (!) 172/95 (BP Location: Right Arm)   Pulse 77   Temp 97.7 F (36.5 C) (Oral)   Resp 18   Ht 5' 2 (1.575 m)   Wt 60.4 kg   SpO2 95%   BMI 24.35 kg/m  Discharge exam:   Gen: alert female resting comfortably in bed Cardiovascular: RRR-no m/r/g Pulmonary: CTA-B Skin: warm  Psych: normal affect  Pertinent Labs, Studies, and Procedures:     Latest Ref Rng & Units 09/15/2023    8:53 AM 09/13/2023    1:50 AM 09/12/2023   11:03 AM  CBC  WBC 4.0 - 10.5  K/uL 7.5  7.8  7.7   Hemoglobin 12.0 - 15.0 g/dL 85.6  86.1  85.9   Hematocrit 36.0 - 46.0 % 42.7  40.8  42.6   Platelets 150 - 400 K/uL 164  175  180        Latest Ref Rng & Units 09/15/2023    8:53 AM 09/14/2023    3:55 AM 09/13/2023    1:50 AM  CMP  Glucose 70 - 99 mg/dL 850  896  887   BUN 8 - 23 mg/dL 25  28  23  Creatinine 0.44 - 1.00 mg/dL 8.71  8.60  8.72   Sodium 135 - 145 mmol/L 137  137  138   Potassium 3.5 - 5.1 mmol/L 3.8  3.4  3.6   Chloride 98 - 111 mmol/L 104  102  104   CO2 22 - 32 mmol/L 19  24  21    Calcium  8.9 - 10.3 mg/dL 8.8  8.4  9.0     ECHOCARDIOGRAM COMPLETE Result Date: 09/13/2023    ECHOCARDIOGRAM REPORT   Patient Name:   Kiaraliz M Cruey Date of Exam: 09/13/2023 Medical Rec #:  [id number]        Height:       62.0 in Accession #:    [id number]       Weight:       122.4 lb Date of Birth:  01-23-1931        BSA:          1.551 m Patient Age:    89 years         BP:           144/50 mmHg Patient Gender: F                HR:           39 bpm. Exam Location:  Inpatient Procedure: 2D Echo (Both Spectral and Color Flow Doppler were utilized during            procedure). Indications:    complete heart block  History:        Patient has no prior history of Echocardiogram examinations.                 Abnormal ECG; Risk Factors:Hypertension and Dyslipidemia.  Sonographer:    Tinnie Barefoot RDCS Referring Phys: [id number] RHONDA G BARRETT IMPRESSIONS  1. Left ventricular ejection fraction, by estimation, is >75%. The left ventricle has hyperdynamic function. The left ventricle has no regional wall motion abnormalities. There is mild left ventricular hypertrophy. Left ventricular diastolic parameters are indeterminate.  2. Right ventricular systolic function is normal. The right ventricular size is normal.  3. The mitral valve is normal in structure. Trivial mitral valve regurgitation. No evidence of mitral stenosis.  4. The aortic valve is tricuspid. Aortic valve regurgitation is not  visualized. Aortic valve sclerosis is present, with no evidence of aortic valve stenosis.  5. The inferior vena cava is normal in size with greater than 50% respiratory variability, suggesting right atrial pressure of 3 mmHg. Comparison(s): No prior Echocardiogram. FINDINGS  Left Ventricle: Left ventricular ejection fraction, by estimation, is >75%. The left ventricle has hyperdynamic function. The left ventricle has no regional wall motion abnormalities. The left ventricular internal cavity size was normal in size. There is mild left ventricular hypertrophy. Left ventricular diastolic parameters are indeterminate. Right Ventricle: The right ventricular size is normal. Right ventricular systolic function is normal. Left Atrium: Left atrial size was normal in size. Right Atrium: Right atrial size was normal in size. Pericardium: There is no evidence of pericardial effusion. Mitral Valve: The mitral valve is normal in structure. Mild mitral annular calcification. Trivial mitral valve regurgitation. No evidence of mitral valve stenosis. Tricuspid Valve: The tricuspid valve is normal in structure. Tricuspid valve regurgitation is not demonstrated. No evidence of tricuspid stenosis. Aortic Valve: The aortic valve is tricuspid. Aortic valve regurgitation is not visualized. Aortic valve sclerosis is present, with no evidence of aortic valve stenosis. Pulmonic Valve: The pulmonic valve was normal in structure. Pulmonic  valve regurgitation is not visualized. No evidence of pulmonic stenosis. Aorta: The aortic root is normal in size and structure. Venous: The inferior vena cava is normal in size with greater than 50% respiratory variability, suggesting right atrial pressure of 3 mmHg. IAS/Shunts: No atrial level shunt detected by color flow Doppler.  LEFT VENTRICLE PLAX 2D LVIDd:         4.20 cm   Diastology LVIDs:         2.20 cm   LV e' medial:    13.30 cm/s LV PW:         1.00 cm   LV E/e' medial:  6.2 LV IVS:        1.20  cm   LV e' lateral:   13.80 cm/s LVOT diam:     1.80 cm   LV E/e' lateral: 5.9 LV SV:         91 LV SV Index:   59 LVOT Area:     2.54 cm  RIGHT VENTRICLE             IVC RV Basal diam:  2.60 cm     IVC diam: 1.20 cm RV S prime:     15.00 cm/s TAPSE (M-mode): 2.6 cm LEFT ATRIUM             Index        RIGHT ATRIUM           Index LA diam:        3.30 cm 2.13 cm/m   RA Area:     14.50 cm LA Vol (A2C):   40.6 ml 26.17 ml/m  RA Volume:   35.80 ml  23.08 ml/m LA Vol (A4C):   42.1 ml 27.14 ml/m LA Biplane Vol: 43.1 ml 27.78 ml/m  AORTIC VALVE LVOT Vmax:   129.00 cm/s LVOT Vmean:  84.600 cm/s LVOT VTI:    0.359 m  AORTA Ao Root diam: 2.60 cm Ao Asc diam:  2.60 cm MITRAL VALVE MV Area (PHT): 1.80 cm     SHUNTS MV Decel Time: 422 msec     Systemic VTI:  0.36 m MV E velocity: 81.90 cm/s   Systemic Diam: 1.80 cm MV A velocity: 112.00 cm/s MV E/A ratio:  0.73 Redell Shallow MD Electronically signed by Redell Shallow MD Signature Date/Time: 09/13/2023/10:01:44 AM    Final    DG Chest Portable 1 View Result Date: 09/12/2023 EXAM: 1 VIEW XRAY OF THE CHEST 09/12/2023 11:31:30 AM COMPARISON: PA and lateral radiographs of the chest dated 05/29/2000. CLINICAL HISTORY: 89 year old female with weakness and heart block, history of UTI symptoms. FINDINGS: LUNGS AND PLEURA: No focal pulmonary opacity. No pulmonary edema. No pleural effusion. No pneumothorax. HEART AND MEDIASTINUM: Mild calcification within the aortic arch. No acute abnormality of the cardiac and mediastinal silhouettes. BONES AND SOFT TISSUES: Mild chronic elevation of the left hemidiaphragm. No acute osseous abnormality. IMPRESSION: 1. No acute process. Electronically signed by: Evalene Coho MD 09/12/2023 11:52 AM EDT RP Workstation: [id number]     Discharge Instructions: Discharge Instructions     Call MD for:  difficulty breathing, headache or visual disturbances   Complete by: As directed    Call MD for:  severe uncontrolled pain   Complete by: As  directed    Diet - low sodium heart healthy   Complete by: As directed    Face-to-face encounter (required for Medicare/Medicaid patients)   Complete by: As directed    I Rebecka Pion certify that this  patient is under my care and that I, or a nurse practitioner or physician's assistant working with me, had a face-to-face encounter that meets the physician face-to-face encounter requirements with this patient on 09/15/2023. The encounter with the patient was in whole, or in part for the following medical condition(s) which is the primary reason for home health care (List medical condition): chronic deconditioning   The encounter with the patient was in whole, or in part, for the following medical condition, which is the primary reason for home health care: chronic deconditioning   I certify that, based on my findings, the following services are medically necessary home health services: Physical therapy   Reason for Medically Necessary Home Health Services: Other See Comments   My clinical findings support the need for the above services: Cognitive impairments, dementia, or mental confusion  that make it unsafe to leave home   Further, I certify that my clinical findings support that this patient is homebound due to: Mental confusion   Home Health   Complete by: As directed    To provide the following care/treatments: PT   Increase activity slowly   Complete by: As directed        Signed: Edgardo Pontiff, DO 09/15/2023, 11:50 AM

## 2023-09-16 ENCOUNTER — Telehealth: Payer: Self-pay

## 2023-09-16 NOTE — Telephone Encounter (Signed)
 Follow-up after same day discharge: Implant date: 09/14/2023 MD: Kennyth  Device: PPM  Location: L chest    Wound check visit: 09/29/2023 90 day MD follow-up: 12/15/2023  Remote Transmission received:yes  Dressing/sling removed: n/a  Confirm OAC restart on: n/a   Please continue to monitor your cardiac device site for redness, swelling, and drainage. Call the device clinic at [phone] if you experience these symptoms, fever/chills, or have questions about your device.   Remote monitoring is used to monitor your cardiac device from home. This monitoring is scheduled every 91 days by our office. It allows us  to keep an eye on the functioning of your device to ensure it is working properly.

## 2023-09-21 ENCOUNTER — Encounter: Payer: Self-pay | Admitting: Cardiology

## 2023-09-29 ENCOUNTER — Ambulatory Visit: Attending: Cardiology

## 2023-09-29 DIAGNOSIS — I442 Atrioventricular block, complete: Secondary | ICD-10-CM | POA: Diagnosis not present

## 2023-09-29 LAB — CUP PACEART INCLINIC DEVICE CHECK
Battery Remaining Longevity: 128 mo
Battery Voltage: 3.07 V
Brady Statistic RA Percent Paced: 0.06 %
Brady Statistic RV Percent Paced: 99.9 %
Date Time Interrogation Session: 20250923163217
Implantable Lead Connection Status: 753985
Implantable Lead Connection Status: 753985
Implantable Lead Implant Date: 20250908
Implantable Lead Implant Date: 20250908
Implantable Lead Location: 753859
Implantable Lead Location: 753860
Implantable Pulse Generator Implant Date: 20250908
Lead Channel Impedance Value: 400 Ohm
Lead Channel Impedance Value: 525 Ohm
Lead Channel Pacing Threshold Amplitude: 0.75 V
Lead Channel Pacing Threshold Amplitude: 0.75 V
Lead Channel Pacing Threshold Amplitude: 1.25 V
Lead Channel Pacing Threshold Amplitude: 1.25 V
Lead Channel Pacing Threshold Pulse Width: 0.5 ms
Lead Channel Pacing Threshold Pulse Width: 0.5 ms
Lead Channel Pacing Threshold Pulse Width: 0.5 ms
Lead Channel Pacing Threshold Pulse Width: 0.5 ms
Lead Channel Sensing Intrinsic Amplitude: 1.2 mV
Lead Channel Sensing Intrinsic Amplitude: 7.5 mV
Lead Channel Setting Pacing Amplitude: 0.875
Lead Channel Setting Pacing Amplitude: 3.5 V
Lead Channel Setting Pacing Pulse Width: 0.5 ms
Lead Channel Setting Sensing Sensitivity: 2 mV
Pulse Gen Model: 2272
Pulse Gen Serial Number: 8296857

## 2023-09-29 NOTE — Progress Notes (Signed)
 Normal dual chamber pacemaker wound check. Presenting rhythm: AS/VP 80. Wound well healed. Routine testing performed. Thresholds, sensing, and impedance consistent with implant measurements and at 3.5V safety margin/auto capture until 3 month visit. Brief AT episode noted. Reviewed arm restrictions to continue for 6 weeks total post op.  Pt enrolled in remote follow-up.

## 2023-09-29 NOTE — Patient Instructions (Signed)

## 2023-10-03 ENCOUNTER — Ambulatory Visit: Payer: Self-pay | Admitting: Cardiology

## 2023-10-04 ENCOUNTER — Emergency Department (HOSPITAL_COMMUNITY)

## 2023-10-04 ENCOUNTER — Observation Stay (HOSPITAL_COMMUNITY)
Admission: EM | Admit: 2023-10-04 | Discharge: 2023-10-06 | Disposition: A | Discharge status: Home / Self Care | Attending: Internal Medicine | Admitting: Internal Medicine

## 2023-10-04 ENCOUNTER — Other Ambulatory Visit: Payer: Self-pay

## 2023-10-04 DIAGNOSIS — N390 Urinary tract infection, site not specified: Secondary | ICD-10-CM | POA: Diagnosis not present

## 2023-10-04 DIAGNOSIS — Z959 Presence of cardiac and vascular implant and graft, unspecified: Secondary | ICD-10-CM | POA: Diagnosis not present

## 2023-10-04 DIAGNOSIS — G9341 Metabolic encephalopathy: Principal | ICD-10-CM | POA: Insufficient documentation

## 2023-10-04 DIAGNOSIS — Z79899 Other long term (current) drug therapy: Secondary | ICD-10-CM | POA: Diagnosis not present

## 2023-10-04 DIAGNOSIS — F32A Depression, unspecified: Secondary | ICD-10-CM | POA: Diagnosis not present

## 2023-10-04 DIAGNOSIS — Z87440 Personal history of urinary (tract) infections: Secondary | ICD-10-CM | POA: Diagnosis present

## 2023-10-04 DIAGNOSIS — Z7982 Long term (current) use of aspirin: Secondary | ICD-10-CM | POA: Insufficient documentation

## 2023-10-04 DIAGNOSIS — N3000 Acute cystitis without hematuria: Principal | ICD-10-CM

## 2023-10-04 DIAGNOSIS — I442 Atrioventricular block, complete: Secondary | ICD-10-CM | POA: Diagnosis not present

## 2023-10-04 DIAGNOSIS — R531 Weakness: Secondary | ICD-10-CM

## 2023-10-04 DIAGNOSIS — F419 Anxiety disorder, unspecified: Secondary | ICD-10-CM | POA: Insufficient documentation

## 2023-10-04 DIAGNOSIS — R4182 Altered mental status, unspecified: Secondary | ICD-10-CM | POA: Diagnosis present

## 2023-10-04 DIAGNOSIS — F0390 Unspecified dementia without behavioral disturbance: Secondary | ICD-10-CM | POA: Insufficient documentation

## 2023-10-04 DIAGNOSIS — I10 Essential (primary) hypertension: Secondary | ICD-10-CM | POA: Insufficient documentation

## 2023-10-04 DIAGNOSIS — R410 Disorientation, unspecified: Secondary | ICD-10-CM

## 2023-10-04 LAB — CBC WITH DIFFERENTIAL/PLATELET
Abs Immature Granulocytes: 0.01 K/uL (ref 0.00–0.07)
Basophils Absolute: 0.1 K/uL (ref 0.0–0.1)
Basophils Relative: 1 %
Eosinophils Absolute: 0.3 K/uL (ref 0.0–0.5)
Eosinophils Relative: 5 %
HCT: 35.1 % — ABNORMAL LOW (ref 36.0–46.0)
Hemoglobin: 11.5 g/dL — ABNORMAL LOW (ref 12.0–15.0)
Immature Granulocytes: 0 %
Lymphocytes Relative: 19 %
Lymphs Abs: 1.1 K/uL (ref 0.7–4.0)
MCH: 29.5 pg (ref 26.0–34.0)
MCHC: 32.8 g/dL (ref 30.0–36.0)
MCV: 90 fL (ref 80.0–100.0)
Monocytes Absolute: 0.7 K/uL (ref 0.1–1.0)
Monocytes Relative: 11 %
Neutro Abs: 4 K/uL (ref 1.7–7.7)
Neutrophils Relative %: 64 %
Platelets: 196 K/uL (ref 150–400)
RBC: 3.9 MIL/uL (ref 3.87–5.11)
RDW: 12.7 % (ref 11.5–15.5)
WBC: 6.2 K/uL (ref 4.0–10.5)
nRBC: 0 % (ref 0.0–0.2)

## 2023-10-04 LAB — URINALYSIS, ROUTINE W REFLEX MICROSCOPIC
Bilirubin Urine: NEGATIVE
Glucose, UA: NEGATIVE mg/dL
Ketones, ur: NEGATIVE mg/dL
Nitrite: NEGATIVE
Protein, ur: NEGATIVE mg/dL
Specific Gravity, Urine: 1.006 (ref 1.005–1.030)
pH: 6 (ref 5.0–8.0)

## 2023-10-04 LAB — COMPREHENSIVE METABOLIC PANEL WITH GFR
ALT: 10 U/L (ref 0–44)
AST: 14 U/L — ABNORMAL LOW (ref 15–41)
Albumin: 3.2 g/dL — ABNORMAL LOW (ref 3.5–5.0)
Alkaline Phosphatase: 65 U/L (ref 38–126)
Anion gap: 11 (ref 5–15)
BUN: 11 mg/dL (ref 8–23)
CO2: 24 mmol/L (ref 22–32)
Calcium: 8.6 mg/dL — ABNORMAL LOW (ref 8.9–10.3)
Chloride: 103 mmol/L (ref 98–111)
Creatinine, Ser: 1 mg/dL (ref 0.44–1.00)
GFR, Estimated: 54 mL/min — ABNORMAL LOW (ref >60)
Glucose, Bld: 103 mg/dL — ABNORMAL HIGH (ref 70–99)
Potassium: 3.6 mmol/L (ref 3.5–5.1)
Sodium: 138 mmol/L (ref 135–145)
Total Bilirubin: 0.9 mg/dL (ref 0.0–1.2)
Total Protein: 5.9 g/dL — ABNORMAL LOW (ref 6.5–8.1)

## 2023-10-04 MED ORDER — POLYETHYLENE GLYCOL 3350 17 G PO PACK: 17.0000 g | PACK | Freq: Every day | ORAL | Status: DC | PRN

## 2023-10-04 MED ORDER — POTASSIUM CHLORIDE 20 MEQ PO PACK
40.0000 meq | PACK | Freq: Once | ORAL | Status: AC
  Administered 2023-10-04: 40 meq via ORAL
  Filled 2023-10-04: qty 2

## 2023-10-04 MED ORDER — ACETAMINOPHEN 650 MG RE SUPP: 650.0000 mg | Freq: Four times a day (QID) | RECTAL | Status: DC | PRN

## 2023-10-04 MED ORDER — PROMETHAZINE HCL 12.5 MG PO TABS: 12.5000 mg | ORAL_TABLET | Freq: Four times a day (QID) | ORAL | Status: DC | PRN

## 2023-10-04 MED ORDER — MORPHINE SULFATE (PF) 2 MG/ML IV SOLN
1.0000 mg | Freq: Once | INTRAVENOUS | Status: AC
  Administered 2023-10-04: 1 mg via INTRAVENOUS
  Filled 2023-10-04: qty 1

## 2023-10-04 MED ORDER — IRBESARTAN 75 MG PO TABS
75.0000 mg | ORAL_TABLET | Freq: Every day | ORAL | Status: DC
  Administered 2023-10-05 – 2023-10-06 (×2): 75 mg via ORAL
  Filled 2023-10-04 (×2): qty 1

## 2023-10-04 MED ORDER — SODIUM CHLORIDE 0.9 % IV SOLN
1.0000 g | INTRAVENOUS | Status: DC
  Administered 2023-10-05: 1 g via INTRAVENOUS
  Filled 2023-10-04: qty 10

## 2023-10-04 MED ORDER — HYDRALAZINE HCL 20 MG/ML IJ SOLN: 10.0000 mg | INTRAMUSCULAR | Status: DC | PRN

## 2023-10-04 MED ORDER — AMLODIPINE BESYLATE 5 MG PO TABS
10.0000 mg | ORAL_TABLET | Freq: Every day | ORAL | Status: DC
  Administered 2023-10-05 – 2023-10-06 (×2): 10 mg via ORAL
  Filled 2023-10-04 (×2): qty 2

## 2023-10-04 MED ORDER — SODIUM CHLORIDE 0.9 % IV SOLN
1.0000 g | Freq: Once | INTRAVENOUS | Status: AC
  Administered 2023-10-04: 1 g via INTRAVENOUS
  Filled 2023-10-04: qty 10

## 2023-10-04 MED ORDER — ACETAMINOPHEN 325 MG PO TABS: 650.0000 mg | ORAL_TABLET | Freq: Four times a day (QID) | ORAL | Status: DC | PRN

## 2023-10-04 MED ORDER — HALOPERIDOL LACTATE 5 MG/ML IJ SOLN: 0.5000 mg | Freq: Once | INTRAMUSCULAR | Status: DC

## 2023-10-04 MED ORDER — ENOXAPARIN SODIUM 30 MG/0.3ML IJ SOSY
30.0000 mg | PREFILLED_SYRINGE | INTRAMUSCULAR | Status: DC
  Administered 2023-10-04 – 2023-10-05 (×2): 30 mg via SUBCUTANEOUS
  Filled 2023-10-04 (×2): qty 0.3

## 2023-10-04 NOTE — ED Provider Notes (Signed)
 [place] EMERGENCY DEPARTMENT AT [facility] Provider Note   CSN: [id number] Arrival date & time: 10/04/23  1032     History  Chief Complaint  Patient presents with   Altered Mental Status    Jackie Phelps is a 89 y.o. female with PMH as listed below who presents with Pt bib rcems from home. Pts daughter sent her in for being more confused than normal.  Pt does have dementia at baseline. She was talking about her husband, who is deceased, and asking where she is, which is abnormal for her. Pts daughter did a home test kit for UTI and said it was positive, and so came to ED. She has had similar sxs with UTIs in the past. No reported abdominal pain, flank pain, nausea/vomiting, f/c. She has had increased generalized weakness as well to the point that she couldn't stand today, which is abnormal, as she normally ambulates.   Past Medical History:  Diagnosis Date   Anxiety    Constipation    Dementia (HCC)    HLD (hyperlipidemia)    Hypertension    LBBB (left bundle branch block)    Sleep apnea    Viral warts 10/26/2018   Vitamin D  deficiency disease 10/26/2018       Home Medications Prior to Admission medications   Medication Sig Start Date End Date Taking? Authorizing Provider  amLODipine  (NORVASC ) 10 MG tablet Take 1 tablet (10 mg total) by mouth daily. 09/16/23   Edgardo Pontiff, DO  aspirin  EC 81 MG tablet Take 1 tablet by mouth daily.    [provider]  carvedilol  (COREG ) 6.25 MG tablet Take 2 tablets (12.5 mg total) by mouth 2 (two) times daily. Patient taking differently: Take 6.25 mg by mouth 2 (two) times daily with a meal. 08/13/20   Burnette, Delon CHRISTELLA, PA-C  donepezil  (ARICEPT ) 10 MG tablet Take 10 mg by mouth daily. 06/18/16   [provider]  escitalopram  (LEXAPRO ) 5 MG tablet Take 1 tablet (5 mg total) by mouth daily. 08/13/20   Vivienne Delon CHRISTELLA, PA-C  irbesartan  (AVAPRO ) 75 MG tablet Take 1 tablet (75 mg total) by mouth daily.  09/16/23   Syeda, Raeeha, DO  memantine  (NAMENDA ) 10 MG tablet Take 10 mg by mouth 2 (two) times daily.  06/18/16   [provider]  potassium chloride  SA (KLOR-CON  M) 20 MEQ tablet Take 40 mEq by mouth daily.    [provider]  Probiotic Product (PROBIOTIC ADVANCED PO) Take 1 capsule by mouth at bedtime. Patient not taking: Reported on 09/12/2023    [provider]  simvastatin  (ZOCOR ) 20 MG tablet Take 1 tablet (20 mg total) by mouth daily. 08/13/20   Vivienne Delon CHRISTELLA, PA-C  vitamin B-12 (CYANOCOBALAMIN) 500 MCG tablet Take 500 mcg by mouth daily.    [provider]      Allergies    Penicillins    Review of Systems   Review of Systems A 10 point review of systems was performed and is negative unless otherwise reported in HPI.  Physical Exam Updated Vital Signs BP 128/70 (BP Location: Right Arm)   Pulse 85   Temp 98.5 F (36.9 C) (Oral)   Resp 16   Ht 5' 2 (1.575 m)   Wt 60 kg   SpO2 94%   BMI 24.19 kg/m  Physical Exam General: Normal appearing female, lying in bed.  HEENT: PERRLA, Sclera anicteric, MMM, trachea midline.  Cardiology: RRR, no murmurs/rubs/gallops. BL radial and DP  pulses equal bilaterally.  Resp: Normal respiratory rate and effort. CTAB, no wheezes, rhonchi, crackles.  Abd: Soft, non-tender, non-distended. No rebound tenderness or guarding.  GU: Deferred. MSK: No peripheral edema or signs of trauma. Extremities without deformity or TTP. No cyanosis or clubbing. Skin: warm, dry. No rashes or lesions. Back: No CVA tenderness Neuro: A&Ox4, CNs II-XII grossly intact. MAEs. Sensation grossly intact.  Psych: Normal mood and affect.   ED Results / Procedures / Treatments   Labs (all labs ordered are listed, but only abnormal results are displayed) Labs Reviewed  URINALYSIS, ROUTINE W REFLEX MICROSCOPIC - Abnormal; Notable for the following components:   APPearance HAZY (*)    Hgb urine dipstick SMALL (*)    Leukocytes,Ua  MODERATE (*)    Bacteria, UA MANY (*)    All other components within normal limits  CBC WITH DIFFERENTIAL/PLATELET - Abnormal; Notable for the following components:   Hemoglobin 11.5 (*)    HCT 35.1 (*)    All other components within normal limits  COMPREHENSIVE METABOLIC PANEL WITH GFR - Abnormal; Notable for the following components:   Glucose, Bld 103 (*)    Calcium  8.6 (*)    Total Protein 5.9 (*)    Albumin  3.2 (*)    AST 14 (*)    GFR, Estimated 54 (*)    All other components within normal limits    EKG None  Radiology CTH: Stable. No acute intracranial abnormality   CXR: No acute cardiopulmonary abnormality.   Procedures Procedures    Medications Ordered in ED Medications  cefTRIAXone  (ROCEPHIN ) 1 g in sodium chloride  0.9 % 100 mL IVPB (1 g Intravenous New Bag/Given 10/04/23 1832)  potassium chloride  (KLOR-CON ) packet 40 mEq (40 mEq Oral Given 10/04/23 2047)    ED Course/ Medical Decision Making/ A&P                          Medical Decision Making Amount and/or Complexity of Data Reviewed Labs: ordered. Radiology: ordered.  Risk Decision regarding hospitalization.    This patient presents to the ED for concern of confusion, +home UTI test, this involves an extensive number of treatment options, and is a complaint that carries with it a high risk of complications and morbidity.  I considered the following differential and admission for this acute, potentially life threatening condition.   MDM:    Ddx of acute altered mental status or encephalopathy considered but not limited to: -Intracranial abnormalities such as ICH, hydrocephalus, head trauma - no reported head trauma, HA, or FNDs, however increased AMS in elderly female, obtained CTH which was reassuring -Infection such as UTI - UA positive for UTI. Will give iV ceftriaxone . CXR neg for PNA. No leukocytosis or fever, no c/f sepsis. - No significant electrolyte abnormalities or hyper/hypoglycemia   -ACS or arrhythmia - no CP or palpitations   Clinical Course as of 10/08/23 1358  Sun Oct 04, 2023  1501 Most recent urine culture demonstrates pansensitive E. coli.  Will give IV ceftriaxone .  Discussed with the patient's daughter at bedside.  Patient is too weak to stand at home.  Will admit to the hospitalist. [HN]    Clinical Course User Index [HN] Franklyn Sid SAILOR, MD    Labs: I Ordered, and personally interpreted labs.  The pertinent results include:  those listed above  Imaging: I personally ordered and interpreted imaging including CTH and CXR, and I agree w/ radiologist interpretation  Additional history obtained from  chart review, daughter at bedside.    Reevaluation: After the interventions noted above, I reevaluated the patient and found that they have :stayed the same  Social Determinants of Health: Lives with daughter  Disposition:  admit to medicine  Co morbidities that complicate the patient evaluation  Past Medical History:  Diagnosis Date   Anxiety    Constipation    Dementia (HCC)    HLD (hyperlipidemia)    Hypertension    LBBB (left bundle branch block)    Sleep apnea    Viral warts 10/26/2018   Vitamin D  deficiency disease 10/26/2018     Medicines No orders of the defined types were placed in this encounter.   I have reviewed the patients home medicines and have made adjustments as needed  Problem List / ED Course: Problem List Items Addressed This Visit       Genitourinary   UTI (urinary tract infection) - Primary     Other   Generalized weakness   Other Visit Diagnoses       Delirium         Primary hypertension                       This note was created using dictation software, which may contain spelling or grammatical errors.    Franklyn Sid SAILOR, MD 10/13/23 [phone]

## 2023-10-04 NOTE — H&P (Signed)
 History and Physical    Jackie Phelps FMW:[id number] DOB: 1931-04-08 DOA: 10/04/2023  PCP: Glover Lenis, MD   Patient coming from: Home  I have personally briefly reviewed patient's old medical records in [facility]  Chief Complaint: AMS  HPI: Jackie Phelps is a 89 y.o. female with medical history significant for dementia, hypertension, third-degree heart block. Patient was brought to the ED via EMS with reports of altered mental status.  Patient has baseline dementia, daughter at bedside Jackie Phelps who patient lives with reports that over the past 3 days, patient's has been more confused than normal, seeing things/animals that are not there, talking to her dead husband.  Daughter did a home UTI test kit and it was positive for leukocytes and nitrites.  Patient had an episode of vomiting 3 days ago.  Otherwise no pain.  Recent hospitalization 9/6 to 9/9 under internal medicine residency service with third-degree heart block, heart rate in the 40s, persistent after Aricept  and Coreg  were held.  Patient had PPM implant 9/8, tolerated procedure well.  ED Course: Temperature 98.5.  Heart rate 85, respirate rate 16.  Blood pressure systolic 128/70.  O2 sat 94% on room air.  UA moderate leukocytes many bacteria.  Head CT without acute abnormality, chest x-ray clear.  WBC 6.2. IV ceftriaxone  started.  Review of Systems: As per HPI all other systems reviewed and negative.  Past Medical History:  Diagnosis Date   Anxiety    Constipation    Dementia (HCC)    HLD (hyperlipidemia)    Hypertension    LBBB (left bundle branch block)    Sleep apnea    Viral warts 10/26/2018   Vitamin D  deficiency disease 10/26/2018    Past Surgical History:  Procedure Laterality Date   CATARACT EXTRACTION, BILATERAL     COLONOSCOPY     COLONOSCOPY  03/05/2011   Procedure: COLONOSCOPY;  Surgeon: Claudis RAYMOND Rivet, MD;  Location: [facility]ITE;  Service: Endoscopy;  Laterality: N/A;  1200    CYSTOSCOPY W/ RETROGRADES Bilateral 08/11/2016   Procedure: CYSTOSCOPY WITH BILATERAL RETROGRADE PYELOGRAM;  Surgeon: Sherrilee Belvie CROME, MD;  Location: [facility] ORS;  Service: Urology;  Laterality: Bilateral;   HIP ARTHROPLASTY Left 01/08/2014   Procedure: ARTHROPLASTY BIPOLAR HIP;  Surgeon: Kay Ozell Cummins, MD;  Location: [facility] OR;  Service: Orthopedics;  Laterality: Left;   PACEMAKER IMPLANT N/A 09/14/2023   Procedure: PACEMAKER IMPLANT;  Surgeon: Kennyth Chew, MD;  Location: [facility] INVASIVE CV LAB;  Service: Cardiovascular;  Laterality: N/A;   TOTAL HIP ARTHROPLASTY Right 01/28/2020   Procedure: TOTAL HIP ARTHROPLASTY ANTERIOR APPROACH;  Surgeon: Fidel Rogue, MD;  Location: [facility] OR;  Service: Orthopedics;  Laterality: Right;   TRANSURETHRAL RESECTION OF BLADDER TUMOR N/A 08/11/2016   Procedure: TRANSURETHRAL RESECTION OF BLADDER TUMOR (TURBT);  Surgeon: Sherrilee Belvie CROME, MD;  Location: [facility] ORS;  Service: Urology;  Laterality: N/A;   TRIGGER FINGER RELEASE Bilateral    ring and middle fingers.     reports that she quit smoking about 57 years ago. Her smoking use included cigarettes. She started smoking about 61 years ago. She has a 1 pack-year smoking history. She has never used smokeless tobacco. She reports current alcohol use of about 2.0 standard drinks of alcohol per week. She reports that she does not use drugs.  Allergies  Allergen Reactions   Penicillins Hives, Dermatitis and Rash    Patient tolerated Ancef  without any complications.    Family History  Problem Relation Age of Onset  Hypertension Mother    Healthy Daughter    Colon cancer Neg Hx     Prior to Admission medications   Medication Sig Start Date End Date Taking? Authorizing Provider  amLODipine  (NORVASC ) 10 MG tablet Take 1 tablet (10 mg total) by mouth daily. 09/16/23   Edgardo Pontiff, DO  aspirin  EC 81 MG tablet Take 1 tablet by mouth daily.    [provider]  carvedilol  (COREG ) 6.25 MG tablet Take 2 tablets  (12.5 mg total) by mouth 2 (two) times daily. Patient taking differently: Take 6.25 mg by mouth 2 (two) times daily with a meal. 08/13/20   Burnette, Delon HERO, PA-C  donepezil  (ARICEPT ) 10 MG tablet Take 10 mg by mouth daily. 06/18/16   [provider]  escitalopram  (LEXAPRO ) 5 MG tablet Take 1 tablet (5 mg total) by mouth daily. 08/13/20   Vivienne Delon HERO, PA-C  irbesartan  (AVAPRO ) 75 MG tablet Take 1 tablet (75 mg total) by mouth daily. 09/16/23   Syeda, Raeeha, DO  memantine  (NAMENDA ) 10 MG tablet Take 10 mg by mouth 2 (two) times daily.  06/18/16   [provider]  potassium chloride  SA (KLOR-CON  M) 20 MEQ tablet Take 40 mEq by mouth daily.    [provider]  Probiotic Product (PROBIOTIC ADVANCED PO) Take 1 capsule by mouth at bedtime. Patient not taking: Reported on 09/12/2023    [provider]  simvastatin  (ZOCOR ) 20 MG tablet Take 1 tablet (20 mg total) by mouth daily. 08/13/20   Vivienne Delon HERO, PA-C  vitamin B-12 (CYANOCOBALAMIN) 500 MCG tablet Take 500 mcg by mouth daily.    [provider]    Physical Exam: Vitals:   10/04/23 1050 10/04/23 1055  BP:  128/70  Pulse:  85  Resp:  16  Temp:  98.5 F (36.9 C)  TempSrc:  Oral  SpO2:  94%  Weight: 60 kg   Height: 5' 2 (1.575 m)     Constitutional: NAD, calm, comfortable Vitals:   10/04/23 1050 10/04/23 1055  BP:  128/70  Pulse:  85  Resp:  16  Temp:  98.5 F (36.9 C)  TempSrc:  Oral  SpO2:  94%  Weight: 60 kg   Height: 5' 2 (1.575 m)    Eyes: PERRL, lids and conjunctivae normal ENMT: Mucous membranes are moist.   Neck: normal, supple, no masses, no thyromegaly Respiratory: clear to auscultation bilaterally, no wheezing, no crackles. Normal respiratory effort. No accessory muscle use.  Cardiovascular: Regular rate and rhythm, no murmurs / rubs / gallops.  Trace bilateral pitting lower extremity edema to lower third of legs,  Extremities warm. Abdomen: no tenderness, no  masses palpated. No hepatosplenomegaly.  Musculoskeletal: no clubbing / cyanosis. No joint deformity upper and lower extremities.  Skin: no rashes, lesions, ulcers. No induration Neurologic: No facial asymmetry, speech fluent moving extremities spontaneously.SABRA  Psychiatric: Awake and alert, calm, cooperative, oriented to person only, not oriented situation or place.  Labs on Admission: I have personally reviewed following labs and imaging studies  CBC: Recent Labs  Lab 10/04/23 1205  WBC 6.2  NEUTROABS 4.0  HGB 11.5*  HCT 35.1*  MCV 90.0  PLT 196   Basic Metabolic Panel: Recent Labs  Lab 10/04/23 1205  NA 138  K 3.6  CL 103  CO2 24  GLUCOSE 103*  BUN 11  CREATININE 1.00  CALCIUM  8.6*   GFR: Estimated Creatinine Clearance: 29.6 mL/min (by C-G formula based on SCr of 1 mg/dL). Liver Function  Tests: Recent Labs  Lab 10/04/23 1205  AST 14*  ALT 10  ALKPHOS 65  BILITOT 0.9  PROT 5.9*  ALBUMIN  3.2*   Urine analysis:    Component Value Date/Time   COLORURINE YELLOW 10/04/2023 1411   APPEARANCEUR HAZY (A) 10/04/2023 1411   LABSPEC 1.006 10/04/2023 1411   PHURINE 6.0 10/04/2023 1411   GLUCOSEU NEGATIVE 10/04/2023 1411   HGBUR SMALL (A) 10/04/2023 1411   BILIRUBINUR NEGATIVE 10/04/2023 1411   BILIRUBINUR neg 05/25/2019 1500   KETONESUR NEGATIVE 10/04/2023 1411   PROTEINUR NEGATIVE 10/04/2023 1411   UROBILINOGEN negative (A) 05/25/2019 1500   UROBILINOGEN 0.2 01/08/2014 1506   NITRITE NEGATIVE 10/04/2023 1411   LEUKOCYTESUR MODERATE (A) 10/04/2023 1411    Radiological Exams on Admission: DG Chest Portable 1 View Result Date: 10/04/2023 CLINICAL DATA:  89 year old female with altered mental status, increased confusion, possible UTI. EXAM: PORTABLE CHEST 1 VIEW COMPARISON:  Chest radiographs 09/15/2023. FINDINGS: Portable AP view at 1208 hours. More lordotic positioning. Stable left chest dual lead cardiac pacemaker. Mediastinal contours and lung volumes are  stable and within normal limits. Visualized tracheal air column is within normal limits. Allowing for portable technique the lungs are clear. No acute osseous abnormality identified. Negative visible bowel gas. IMPRESSION: No acute cardiopulmonary abnormality. Electronically Signed   By: VEAR Hurst M.D.   On: 10/04/2023 12:30   CT Head Wo Contrast Result Date: 10/04/2023 CLINICAL DATA:  89 year old female with altered mental status, increased confusion, possible UTI. EXAM: CT HEAD WITHOUT CONTRAST TECHNIQUE: Contiguous axial images were obtained from the base of the skull through the vertex without intravenous contrast. RADIATION DOSE REDUCTION: This exam was performed according to the departmental dose-optimization program which includes automated exposure control, adjustment of the mA and/or kV according to patient size and/or use of iterative reconstruction technique. COMPARISON:  Brain MRI 06/14/2023.  Head CT 08/05/2023. FINDINGS: Brain: Stable cerebral volume. Stable ventricle size and configuration. No midline shift, mass effect, or evidence of intracranial mass lesion. No acute intracranial hemorrhage identified. Patchy chronic encephalomalacia anterior left superior frontal gyrus. Mild for age additional patchy cerebral white matter hypodensity is stable. No cortically based acute infarct identified. Vascular: Mild Calcified atherosclerosis at the skull base. No suspicious intracranial vascular hyperdensity. Skull: Stable and intact. Sinuses/Orbits: Visualized paranasal sinuses and mastoids are stable and well aerated. Other: No acute orbit or scalp soft tissue finding. IMPRESSION: Stable.  No acute intracranial abnormality. Electronically Signed   By: VEAR Hurst M.D.   On: 10/04/2023 12:29   EKG: Pending   Assessment/Plan Principal Problem:   Acute metabolic encephalopathy Active Problems:   History of recurrent UTIs   Systolic essential hypertension   Dementia without behavioral disturbance (HCC)    Anxiety and depression   Third degree heart block (HCC)  Assessment and Plan:  Acute metabolic encephalopathy 2/2 UTI-baseline dementia, worsening confusion, [place] for UTIs.  UA today suggestive of UTI with moderate leukocytes many bacteria.  Afebrile, WBC 6.2.  Rules out for sepsis.  Chest x-ray, head CT negative for acute abnormality.  Recent urine culture 09/12/2023-pansensitive E. coli. - Continue IV ceftriaxone  - Follow-up urine cultures  Third-degree heart block/pacemaker -PPM implant during recent hospitalization 09/14/2023.  Hypertension-stable. -Resume irbesartan , Norvasc  10 mg,   Dementia/anxiety and depression-lives at home with daughter.  - Resume Lexapro   DVT prophylaxis: Lovenox  Code Status: Full code confirmed with daughter Jackie Phelps at bedside.  ACP documents reviewed. Family Communication: Daughter Jackie Phelps at bedside Disposition Plan: ~ 2 days Consults called: none  Admission status: inpt tele I certify that at the point of admission it is my clinical judgment that the patient will require inpatient hospital care spanning beyond 2 midnights from the point of admission due to high intensity of service, high risk for further deterioration and high frequency of surveillance required.    Author: Tully FORBES Carwin, MD 10/04/2023 4:26 PM  For on call review [email or web address].

## 2023-10-04 NOTE — ED Triage Notes (Signed)
 Pt bib rcems from home. Pts daughter sent her in for being more confused than normal. Pt does have dementia at baseline. Pts daughter did a home test kit for UTI and said it was positive. Wants her to be checked for UTI.

## 2023-10-05 DIAGNOSIS — F419 Anxiety disorder, unspecified: Secondary | ICD-10-CM | POA: Diagnosis not present

## 2023-10-05 DIAGNOSIS — I442 Atrioventricular block, complete: Secondary | ICD-10-CM | POA: Diagnosis not present

## 2023-10-05 DIAGNOSIS — Z87440 Personal history of urinary (tract) infections: Secondary | ICD-10-CM | POA: Diagnosis not present

## 2023-10-05 DIAGNOSIS — G9341 Metabolic encephalopathy: Secondary | ICD-10-CM | POA: Diagnosis not present

## 2023-10-05 LAB — CBC
HCT: 36.9 % (ref 36.0–46.0)
Hemoglobin: 12.3 g/dL (ref 12.0–15.0)
MCH: 29.5 pg (ref 26.0–34.0)
MCHC: 33.3 g/dL (ref 30.0–36.0)
MCV: 88.5 fL (ref 80.0–100.0)
Platelets: 220 K/uL (ref 150–400)
RBC: 4.17 MIL/uL (ref 3.87–5.11)
RDW: 12.8 % (ref 11.5–15.5)
WBC: 6 K/uL (ref 4.0–10.5)
nRBC: 0 % (ref 0.0–0.2)

## 2023-10-05 LAB — BASIC METABOLIC PANEL WITH GFR
Anion gap: 8 (ref 5–15)
BUN: 8 mg/dL (ref 8–23)
CO2: 22 mmol/L (ref 22–32)
Calcium: 8.5 mg/dL — ABNORMAL LOW (ref 8.9–10.3)
Chloride: 109 mmol/L (ref 98–111)
Creatinine, Ser: 0.77 mg/dL (ref 0.44–1.00)
GFR, Estimated: >60 mL/min (ref >60)
Glucose, Bld: 88 mg/dL (ref 70–99)
Potassium: 4.1 mmol/L (ref 3.5–5.1)
Sodium: 139 mmol/L (ref 135–145)

## 2023-10-05 NOTE — Care Management Obs Status (Signed)
 MEDICARE OBSERVATION STATUS NOTIFICATION   Patient Details  Name: VITA CURRIN MRN: [id number] Date of Birth: 26-Jan-1931   Medicare Observation Status Notification Given:  Yes    Noreen KATHEE Cleotilde ISRAEL 10/05/2023, 4:18 PM

## 2023-10-05 NOTE — Progress Notes (Signed)
  Progress Note   Patient: Jackie Phelps FMW:[id number] DOB: 08-31-1931 DOA: 10/04/2023     1 DOS: the patient was seen and examined on 10/05/2023   Brief hospital admission narrative: As per H&P written by Dr. Pearlean on 10/04/2023  AUTRY DROEGE is a 89 y.o. female with medical history significant for dementia, hypertension, third-degree heart block. Patient was brought to the ED via EMS with reports of altered mental status.  Patient has baseline dementia, daughter at bedside Damien who patient lives with reports that over the past 3 days, patient's has been more confused than normal, seeing things/animals that are not there, talking to her dead husband.  Daughter did a home UTI test kit and it was positive for leukocytes and nitrites.  Patient had an episode of vomiting 3 days ago.  Otherwise no pain.   Recent hospitalization 9/6 to 9/9 under internal medicine residency service with third-degree heart block, heart rate in the 40s, persistent after Aricept  and Coreg  were held.  Patient had PPM implant 9/8, tolerated procedure well.   ED Course: Temperature 98.5.  Heart rate 85, respirate rate 16.  Blood pressure systolic 128/70.  O2 sat 94% on room air.  UA moderate leukocytes many bacteria.  Head CT without acute abnormality, chest x-ray clear.  WBC 6.2. IV ceftriaxone  started.  Assessment and plan 1-acute metabolic encephalopathy secondary to UTI - Adequate improvement appreciated after fluid and antibiotics initiated - Patient denies any nausea/vomiting and is currently afebrile - Will follow cultures/sensitivity results and transition antibiotics to oral route at time of discharge - Patient has been instructed to maintain adequate hydration.  2-third-degree heart block s/p pacemaker - Status post pacemaker implantation - Sinus rhythm appreciated.  3-hypertension -Continue current antihypertensive agent - Follow vital signs.  4-dementia/anxiety and depression - Continue  Lexapro  - Continue constant reorientation and supportive care.  Subjective:  No nausea, no vomiting, no shortness of breath and currently afebrile.  Overall feeling better and per family reports improved mentation.  Physical Exam: Vitals:   10/04/23 1736 10/04/23 2001 10/05/23 0556 10/05/23 1315  BP: (!) 156/74 (!) 162/68 (!) 141/76 114/61  Pulse: 87 80 76 84  Resp: 15 20 18    Temp: 98 F (36.7 C) 97.7 F (36.5 C) 98.8 F (37.1 C) 97.9 F (36.6 C)  TempSrc: Oral Oral Oral Oral  SpO2: 96% 96% 95% 93%  Weight:      Height:       General exam: Alert, awake, following commands appropriately; in no acute distress. Respiratory system: Good saturation on room air. Cardiovascular system: Rate controlled, no rubs, no gallops, no JVD on exam. Gastrointestinal system: Abdomen is nondistended, soft and nontender.  Positive bowel sounds.  No guarding. Central nervous system: Moving 4 limbs spontaneously.  No focal neurological deficits. Extremities: No cyanosis or clubbing. Skin: No petechiae. Psychiatry: Judgement and insight appear impaired secondary to underlying dementia; currently stable and essentially back to baseline according to family report.  Stable mood.   Data Reviewed: CBC: WBC 6.0, hemoglobin 12.3 and platelet count 220K Basic metabolic panel: Sodium 139, potassium 4.1, chloride 109, bicarb 22, BUN 8, creatinine 0.77 and GFR >60  Family Communication: Daughter updated at bedside.  Disposition: Status is: Observation The patient remains OBS appropriate and will d/c before 2 midnights.  Anticipating discharge back home, with family care and home health services.  Time spent: 35 minutes  Author: Eric Nunnery, MD 10/05/2023 2:45 PM  For on call review [email or web address].

## 2023-10-05 NOTE — Plan of Care (Signed)
   Problem: Activity: Goal: Risk for activity intolerance will decrease Outcome: Progressing   Problem: Nutrition: Goal: Adequate nutrition will be maintained Outcome: Progressing   Problem: Coping: Goal: Level of anxiety will decrease Outcome: Progressing

## 2023-10-05 NOTE — TOC Initial Note (Signed)
 Transition of Care [facility]) - Initial/Assessment Note    Patient Details  Name: Jackie Phelps MRN: [id number] Date of Birth: 20-Feb-1931  Transition of Care [facility]) CM/SW Contact:    Noreen KATHEE Pinal, LCSWA Phone Number: 10/05/2023, 3:52 PM  Clinical Narrative:                  Patient is at risk for readmission due to high admission . Patient was admitted for Acute metabolic encephalopathy. CSW Spoke with daughter at bedside and assessed patient. Patient lives with daughter, daughter is supportive and assist with ADL's. Patient is active with [facility] for PT/OT/RN. Daughter requested only PT for now when pt DC home. Selinda with [facility] was updated. Will ask MD to place resumption orders once medically ready for DC.     Expected Discharge Plan: Home/Self Care Barriers to Discharge: Continued Medical Work up   Patient Goals and CMS Choice Patient states their goals for this hospitalization and ongoing recovery are:: return back with daughter CMS Medicare.gov Compare Post Acute Care list provided to:: Patient Represenative (must comment) Choice offered to / list presented to : Adult Children      Expected Discharge Plan and Services In-house Referral: NA   Post Acute Care Choice: Home Health Living arrangements for the past 2 months: Single Family Home                           HH Arranged: PT HH Agency: [facility]) Date HH Agency Contacted: 10/05/23 Time HH Agency Contacted: 1549 Representative spoke with at [facility] Agency: Selinda  Prior Living Arrangements/Services Living arrangements for the past 2 months: Single Family Home Lives with:: Adult Children Patient language and need for interpreter reviewed:: Yes Do you feel safe going back to the place where you live?: Yes        Care giver support system in place?: Yes (comment) Current home services: DME Criminal Activity/Legal Involvement Pertinent to Current Situation/Hospitalization: No - Comment as  needed  Activities of Daily Living   ADL Screening (condition at time of admission) Independently performs ADLs?: No Does the patient have a NEW difficulty with bathing/dressing/toileting/self-feeding that is expected to last >3 days?: No Does the patient have a NEW difficulty with getting in/out of bed, walking, or climbing stairs that is expected to last >3 days?: No Does the patient have a NEW difficulty with communication that is expected to last >3 days?: No Is the patient deaf or have difficulty hearing?: No Does the patient have difficulty seeing, even when wearing glasses/contacts?: No Does the patient have difficulty concentrating, remembering, or making decisions?: Yes  Permission Sought/Granted   Permission granted to share information with : Yes, Verbal Permission Granted  Share Information with NAME: daughter  Permission granted to share info w AGENCY: HH  Permission granted to share info w Relationship: Damien     Emotional Assessment Appearance:: Appears stated age Attitude/Demeanor/Rapport: Engaged Affect (typically observed): Accepting Orientation: : Oriented to Self Alcohol / Substance Use: Not Applicable Psych Involvement: No (comment)  Admission diagnosis:  Delirium [R41.0] Acute cystitis without hematuria [N30.00] Generalized weakness [R53.1] Acute metabolic encephalopathy [G93.41] Patient Active Problem List   Diagnosis Date Noted   Acute metabolic encephalopathy 10/04/2023   History of recurrent UTIs 09/13/2023   Acute kidney injury 09/13/2023   Third degree heart block (HCC) 09/12/2023   UTI (urinary tract infection) 06/07/2023   Generalized weakness 06/07/2023   Hypokalemia 06/07/2023   Prolonged  QT interval 06/07/2023   Dementia without behavioral disturbance (HCC) 06/07/2023   Anxiety and depression 06/07/2023   Other fracture of right femur, initial encounter for closed fracture (HCC) 01/27/2020   Malignant neoplasm of urinary bladder (HCC)  05/25/2019   Vitamin D  deficiency disease 10/26/2018   Viral warts 10/26/2018   Mixed hyperlipidemia 10/26/2018   Iron deficiency anemia 01/22/2014   Anxiety state 01/16/2014   Cough 01/16/2014   Left displaced femoral neck fracture (HCC) 01/08/2014   Chronic constipation 02/03/2011   Systolic essential hypertension 02/03/2011   PCP:  Glover Lenis, MD Pharmacy:   [facility] - [place], [place] - [address] [place] [address] Phone: [phone] Fax: [phone]     Social Drivers of Health (SDOH) Social History: SDOH Screenings   Food Insecurity: No Food Insecurity (10/04/2023)  Housing: Low Risk  (10/04/2023)  Transportation Needs: No Transportation Needs (10/04/2023)  Utilities: Not At Risk (10/04/2023)  Depression (PHQ2-9): Low Risk  (12/19/2019)  Financial Resource Strain: Low Risk  (06/03/2023)   Received from [facility]  Social Connections: Moderately Integrated (10/04/2023)  Tobacco Use: Medium Risk (09/12/2023)   SDOH Interventions:     Readmission Risk Interventions    10/05/2023    3:45 PM 09/15/2023   10:42 AM 06/08/2023   11:36 AM  Readmission Risk Prevention Plan  Transportation Screening Complete Complete Complete  PCP or Specialist Appt within 3-5 Days  Complete   HRI or Home Care Consult Complete Complete Complete  Social Work Consult for Recovery Care Planning/Counseling Complete  Complete  Palliative Care Screening Not Applicable Not Applicable Not Applicable  Medication Review Oceanographer) Complete Complete Complete

## 2023-10-05 NOTE — Progress Notes (Signed)
 Mobility Specialist Progress Note:    10/05/23 1300  Mobility  Activity Ambulated with assistance  Level of Assistance Contact guard assist, steadying assist  Assistive Device Other (Comment) (1 HHA)  Distance Ambulated (ft) 20 ft  Range of Motion/Exercises Active;All extremities  Activity Response Tolerated well  Mobility Referral Yes  Mobility visit 1 Mobility  Mobility Specialist Start Time (ACUTE ONLY) 1300  Mobility Specialist Stop Time (ACUTE ONLY) 1320  Mobility Specialist Time Calculation (min) (ACUTE ONLY) 20 min   Pt received in bed, requesting assistance to bathroom. Required CGA to stand and ambulate with 1 hand-held assist. Tolerated well, slightly confused. Returned supine, alarm on. All needs met.  Jackie Phelps Mobility Specialist Please contact via Special educational needs teacher or  Rehab office at [phone]

## 2023-10-05 NOTE — Progress Notes (Addendum)
 Throughout my shift, pt has gotten out of bed multiple times with bed alarm alarming staff. MD Arrien made aware at 2025 of pt's anxiety and agitation, because she is confused. Pt cannot remember where she is, how she got here, where she is from or who brought her here and shows anxiety with mild agitation because she cannot remember these things. Pt reoriented each time. See new orders for MD Arrien. 1mg  Morphine  given at 2150 which calmed pt for some time. Pt asks for husband who is dead, per notes as well as her daughter. Attempted to call daughter to speak with pt, no answer. Pt is in bed now reading the news paper, continues to ask Where am I? And How did I get here? Pt reoriented with emotional support given. Fall risk socks on, bracelet, and matts down.

## 2023-10-05 NOTE — Care Management CC44 (Signed)
 Condition Code 44 Documentation Completed  Patient Details  Name: GRACLYN LAWTHER MRN: [id number] Date of Birth: 15-Jan-1931   Condition Code 44 given:  Yes Patient signature on Condition Code 44 notice:  Yes Documentation of 2 MD's agreement:  Yes Code 44 added to claim:  Yes    Noreen KATHEE Pinal, LCSWA 10/05/2023, 4:18 PM

## 2023-10-06 DIAGNOSIS — F419 Anxiety disorder, unspecified: Secondary | ICD-10-CM | POA: Diagnosis not present

## 2023-10-06 DIAGNOSIS — G9341 Metabolic encephalopathy: Secondary | ICD-10-CM | POA: Diagnosis not present

## 2023-10-06 DIAGNOSIS — F0390 Unspecified dementia without behavioral disturbance: Secondary | ICD-10-CM | POA: Diagnosis not present

## 2023-10-06 DIAGNOSIS — N3000 Acute cystitis without hematuria: Secondary | ICD-10-CM | POA: Diagnosis not present

## 2023-10-06 MED ORDER — CEFADROXIL 500 MG PO CAPS: 500.0000 mg | ORAL_CAPSULE | Freq: Two times a day (BID) | ORAL | 0 refills | Status: AC

## 2023-10-06 NOTE — Discharge Summary (Signed)
 Physician Discharge Summary   Patient: Jackie Phelps MRN: [id number] DOB: 1931-07-28  Admit date:     10/04/2023  Discharge date: 10/06/23  Discharge Physician: Eric Nunnery   PCP: Glover Lenis, MD   Recommendations at discharge:  Repeat basic metabolic panel to follow his lites and renal function Reassess blood pressure and adjust antihypertensive med as needed Acute patient follow-up with urology service as instructed.  Discharge Diagnoses: Principal Problem:   Acute metabolic encephalopathy Active Problems:   History of recurrent UTIs   Systolic essential hypertension   Dementia without behavioral disturbance (HCC)   Anxiety and depression   Third degree heart block [facility])  Brief hospital admission narrative: As per H&P written by Dr. Pearlean on 10/04/2023  Jackie Phelps is a 89 y.o. female with medical history significant for dementia, hypertension, third-degree heart block. Patient was brought to the ED via EMS with reports of altered mental status.  Patient has baseline dementia, daughter at bedside Jackie Phelps who patient lives with reports that over the past 3 days, patient's has been more confused than normal, seeing things/animals that are not there, talking to her dead husband.  Daughter did a home UTI test kit and it was positive for leukocytes and nitrites.  Patient had an episode of vomiting 3 days ago.  Otherwise no pain.   Recent hospitalization 9/6 to 9/9 under internal medicine residency service with third-degree heart block, heart rate in the 40s, persistent after Aricept  and Coreg  were held.  Patient had PPM implant 9/8, tolerated procedure well.   ED Course: Temperature 98.5.  Heart rate 85, respirate rate 16.  Blood pressure systolic 128/70.  O2 sat 94% on room air.  UA moderate leukocytes many bacteria.  Head CT without acute abnormality, chest x-ray clear.  WBC 6.2. IV ceftriaxone  started.  Assessment and Plan: 1-acute metabolic encephalopathy secondary  to E. coli UTI - Adequate improvement appreciated after fluid and antibiotics initiated - Patient denies any nausea/vomiting and is currently afebrile - Urine cultures demonstrating the presence of E. coli microorganism; antibiotics transition to oral Duricef to complete therapy. - Patient has been instructed to maintain adequate hydration. -Outpatient follow-up with urology service recommended for cystoscopy and decision regarding suppressive therapy.   2-third-degree heart block s/p pacemaker - Status post pacemaker implantation - Sinus rhythm appreciated. -Outpatient follow-up with EP cardiology as previously instructed recommended.   3-hypertension -Continue current antihypertensive agent - Follow vital signs.   4-dementia/anxiety and depression - Continue Lexapro  - Continue constant reorientation and supportive care.  Consultants: None Procedures performed: See below for x-ray reports. Disposition: Home with home health services. Diet recommendation: Heart healthy/low-sodium diet.  DISCHARGE MEDICATION: Allergies as of 10/06/2023       Reactions   Penicillins Hives, Dermatitis, Rash   Patient tolerated Ancef  without any complications.        Medication List     STOP taking these medications    carvedilol  6.25 MG tablet Commonly known as: COREG    donepezil  10 MG tablet Commonly known as: ARICEPT        TAKE these medications    amLODipine  10 MG tablet Commonly known as: NORVASC  Take 1 tablet (10 mg total) by mouth daily.   aspirin  EC 81 MG tablet Take 1 tablet by mouth daily.   cefadroxil 500 MG capsule Commonly known as: DURICEF Take 1 capsule (500 mg total) by mouth 2 (two) times daily for 6 days.   escitalopram  5 MG tablet Commonly known as: LEXAPRO  Take 1 tablet (5  mg total) by mouth daily.   irbesartan  75 MG tablet Commonly known as: AVAPRO  Take 1 tablet (75 mg total) by mouth daily.   memantine  10 MG tablet Commonly known as:  NAMENDA  Take 10 mg by mouth 2 (two) times daily.   PROBIOTIC ADVANCED PO Take 1 capsule by mouth at bedtime.   simvastatin  20 MG tablet Commonly known as: ZOCOR  Take 1 tablet (20 mg total) by mouth daily.   vitamin B-12 500 MCG tablet Commonly known as: CYANOCOBALAMIN Take 500 mcg by mouth daily.        Follow-up Information     Glover Lenis, MD. Schedule an appointment as soon as possible for a visit in 10 day(s).   Specialty: Family Medicine Contact information: [address] [place] [address] [phone]                Discharge Exam: Filed Weights   10/04/23 1050  Weight: 60 kg   General exam: Alert, awake, following commands appropriately; in no acute distress. Respiratory system: Good saturation on room air. Cardiovascular system: Rate controlled, no rubs, no gallops, no JVD on exam. Gastrointestinal system: Abdomen is nondistended, soft and nontender.  Positive bowel sounds.  No guarding. Central nervous system: Moving 4 limbs spontaneously.  No focal neurological deficits. Extremities: No cyanosis or clubbing. Skin: No petechiae. Psychiatry: Judgement and insight appear impaired secondary to underlying dementia; currently stable and essentially back to baseline according to family report.  Stable mood.  Condition at discharge: Stable and improved.  The results of significant diagnostics from this hospitalization (including imaging, microbiology, ancillary and laboratory) are listed below for reference.   Imaging Studies: DG Chest Portable 1 View Result Date: 10/04/2023 CLINICAL DATA:  89 year old female with altered mental status, increased confusion, possible UTI. EXAM: PORTABLE CHEST 1 VIEW COMPARISON:  Chest radiographs 09/15/2023. FINDINGS: Portable AP view at 1208 hours. More lordotic positioning. Stable left chest dual lead cardiac pacemaker. Mediastinal contours and lung volumes are stable and within normal limits. Visualized tracheal air  column is within normal limits. Allowing for portable technique the lungs are clear. No acute osseous abnormality identified. Negative visible bowel gas. IMPRESSION: No acute cardiopulmonary abnormality. Electronically Signed   By: VEAR Hurst M.D.   On: 10/04/2023 12:30   CT Head Wo Contrast Result Date: 10/04/2023 CLINICAL DATA:  89 year old female with altered mental status, increased confusion, possible UTI. EXAM: CT HEAD WITHOUT CONTRAST TECHNIQUE: Contiguous axial images were obtained from the base of the skull through the vertex without intravenous contrast. RADIATION DOSE REDUCTION: This exam was performed according to the departmental dose-optimization program which includes automated exposure control, adjustment of the mA and/or kV according to patient size and/or use of iterative reconstruction technique. COMPARISON:  Brain MRI 06/14/2023.  Head CT 08/05/2023. FINDINGS: Brain: Stable cerebral volume. Stable ventricle size and configuration. No midline shift, mass effect, or evidence of intracranial mass lesion. No acute intracranial hemorrhage identified. Patchy chronic encephalomalacia anterior left superior frontal gyrus. Mild for age additional patchy cerebral white matter hypodensity is stable. No cortically based acute infarct identified. Vascular: Mild Calcified atherosclerosis at the skull base. No suspicious intracranial vascular hyperdensity. Skull: Stable and intact. Sinuses/Orbits: Visualized paranasal sinuses and mastoids are stable and well aerated. Other: No acute orbit or scalp soft tissue finding. IMPRESSION: Stable.  No acute intracranial abnormality. Electronically Signed   By: VEAR Hurst M.D.   On: 10/04/2023 12:29   CUP PACEART INCLINIC DEVICE CHECK Result Date: 09/29/2023 Normal dual chamber pacemaker  wound check. Presenting rhythm: AS/VP 80. Wound well healed. Routine testing performed. Thresholds, sensing, and impedance consistent with implant measurements and at 3.5V safety  margin/auto capture until 3 month visit. Brief AT episode noted. Reviewed arm restrictions to continue for 6 weeks total post op.  Pt enrolled in remote follow-up.Randall Sharps, BSN, RN  EP PPM/ICD IMPLANT Addendum Date: 09/15/2023    CONCLUSIONS:  1. Successful dual chamber pacemaker implant with LBBAP lead.  2. No early apparent complications. Fonda Kitty, MD, [facility], [facility] Cardiac Electrophysiology  Result Date: 09/15/2023    CONCLUSIONS:  1. Successful dual chamber pacemaker implant with LBBAP lead.  2. No early apparent complications. Fonda Kitty, MD, [facility], [facility] Cardiac Electrophysiology   DG Chest 2 View Result Date: 09/15/2023 EXAM: 2 VIEW(S) XRAY OF THE CHEST 09/15/2023 05:08:00 AM COMPARISON: 09/12/2023. CLINICAL HISTORY: [id number] Cardiac device in situ, other [phone]. Cardiac device in situ Cardiac device in situ, other [phone]. Cardiac device in situ FINDINGS: LUNGS AND PLEURA: No pneumothorax identified after pacer placement. No pleural effusion, interstitial edema, or airspace consolidation. HEART AND MEDIASTINUM: Heart size is normal. Interval placement of left chest wall pacer with leads in the projection of the right atrium and right ventricle. The tip of the right atrial lead is oriented anterior and caudally. Aortic atherosclerotic calcification. BONES AND SOFT TISSUES: No acute osseous abnormality. IMPRESSION: 1. Interval placement of left chest wall pacer with leads in the projection of the right atrium and right ventricle. The tip of the right atrial lead is oriented anterior and caudally. 2. No pneumothorax identified after pacer placement. Electronically signed by: Waddell Calk MD 09/15/2023 05:47 AM EDT RP Workstation: [id number]   ECHOCARDIOGRAM COMPLETE Result Date: 09/13/2023    ECHOCARDIOGRAM REPORT   Patient Name:   Maelyn M Demeter Date of Exam: 09/13/2023 Medical Rec #:  [id number]        Height:       62.0 in Accession #:    [id number]       Weight:       122.4 lb Date of Birth:   1931-08-09        BSA:          1.551 m Patient Age:    89 years         BP:           144/50 mmHg Patient Gender: F                HR:           39 bpm. Exam Location:  Inpatient Procedure: 2D Echo (Both Spectral and Color Flow Doppler were utilized during            procedure). Indications:    complete heart block  History:        Patient has no prior history of Echocardiogram examinations.                 Abnormal ECG; Risk Factors:Hypertension and Dyslipidemia.  Sonographer:    Tinnie Barefoot RDCS Referring Phys: [id number] RHONDA G BARRETT IMPRESSIONS  1. Left ventricular ejection fraction, by estimation, is >75%. The left ventricle has hyperdynamic function. The left ventricle has no regional wall motion abnormalities. There is mild left ventricular hypertrophy. Left ventricular diastolic parameters are indeterminate.  2. Right ventricular systolic function is normal. The right ventricular size is normal.  3. The mitral valve is normal in structure. Trivial mitral valve regurgitation. No evidence of mitral stenosis.  4. The aortic valve is tricuspid.  Aortic valve regurgitation is not visualized. Aortic valve sclerosis is present, with no evidence of aortic valve stenosis.  5. The inferior vena cava is normal in size with greater than 50% respiratory variability, suggesting right atrial pressure of 3 mmHg. Comparison(s): No prior Echocardiogram. FINDINGS  Left Ventricle: Left ventricular ejection fraction, by estimation, is >75%. The left ventricle has hyperdynamic function. The left ventricle has no regional wall motion abnormalities. The left ventricular internal cavity size was normal in size. There is mild left ventricular hypertrophy. Left ventricular diastolic parameters are indeterminate. Right Ventricle: The right ventricular size is normal. Right ventricular systolic function is normal. Left Atrium: Left atrial size was normal in size. Right Atrium: Right atrial size was normal in size. Pericardium: There  is no evidence of pericardial effusion. Mitral Valve: The mitral valve is normal in structure. Mild mitral annular calcification. Trivial mitral valve regurgitation. No evidence of mitral valve stenosis. Tricuspid Valve: The tricuspid valve is normal in structure. Tricuspid valve regurgitation is not demonstrated. No evidence of tricuspid stenosis. Aortic Valve: The aortic valve is tricuspid. Aortic valve regurgitation is not visualized. Aortic valve sclerosis is present, with no evidence of aortic valve stenosis. Pulmonic Valve: The pulmonic valve was normal in structure. Pulmonic valve regurgitation is not visualized. No evidence of pulmonic stenosis. Aorta: The aortic root is normal in size and structure. Venous: The inferior vena cava is normal in size with greater than 50% respiratory variability, suggesting right atrial pressure of 3 mmHg. IAS/Shunts: No atrial level shunt detected by color flow Doppler.  LEFT VENTRICLE PLAX 2D LVIDd:         4.20 cm   Diastology LVIDs:         2.20 cm   LV e' medial:    13.30 cm/s LV PW:         1.00 cm   LV E/e' medial:  6.2 LV IVS:        1.20 cm   LV e' lateral:   13.80 cm/s LVOT diam:     1.80 cm   LV E/e' lateral: 5.9 LV SV:         91 LV SV Index:   59 LVOT Area:     2.54 cm  RIGHT VENTRICLE             IVC RV Basal diam:  2.60 cm     IVC diam: 1.20 cm RV S prime:     15.00 cm/s TAPSE (M-mode): 2.6 cm LEFT ATRIUM             Index        RIGHT ATRIUM           Index LA diam:        3.30 cm 2.13 cm/m   RA Area:     14.50 cm LA Vol (A2C):   40.6 ml 26.17 ml/m  RA Volume:   35.80 ml  23.08 ml/m LA Vol (A4C):   42.1 ml 27.14 ml/m LA Biplane Vol: 43.1 ml 27.78 ml/m  AORTIC VALVE LVOT Vmax:   129.00 cm/s LVOT Vmean:  84.600 cm/s LVOT VTI:    0.359 m  AORTA Ao Root diam: 2.60 cm Ao Asc diam:  2.60 cm MITRAL VALVE MV Area (PHT): 1.80 cm     SHUNTS MV Decel Time: 422 msec     Systemic VTI:  0.36 m MV E velocity: 81.90 cm/s   Systemic Diam: 1.80 cm MV A velocity: 112.00  cm/s MV E/A ratio:  0.73 Redell Shallow MD Electronically signed by Redell Shallow MD Signature Date/Time: 09/13/2023/10:01:44 AM    Final    DG Chest Portable 1 View Result Date: 09/12/2023 EXAM: 1 VIEW XRAY OF THE CHEST 09/12/2023 11:31:30 AM COMPARISON: PA and lateral radiographs of the chest dated 05/29/2000. CLINICAL HISTORY: 89 year old female with weakness and heart block, history of UTI symptoms. FINDINGS: LUNGS AND PLEURA: No focal pulmonary opacity. No pulmonary edema. No pleural effusion. No pneumothorax. HEART AND MEDIASTINUM: Mild calcification within the aortic arch. No acute abnormality of the cardiac and mediastinal silhouettes. BONES AND SOFT TISSUES: Mild chronic elevation of the left hemidiaphragm. No acute osseous abnormality. IMPRESSION: 1. No acute process. Electronically signed by: Evalene Coho MD 09/12/2023 11:52 AM EDT RP Workstation: [id number]    Microbiology: Results for orders placed or performed during the hospital encounter of 10/04/23  Urine Culture     Status: Abnormal (Preliminary result)   Collection Time: 10/04/23  2:11 PM   Specimen: Urine, Clean Catch  Result Value Ref Range Status   Specimen Description   Final    URINE, CLEAN CATCH Performed at [facility], [address], [place], [place] [address]    Special Requests   Final    NONE Performed at [facility], [address], [place], [place] [address]    Culture (A)  Final    >=100,000 COLONIES/mL ESCHERICHIA COLI >=100,000 COLONIES/mL KLEBSIELLA AEROGENES SUSCEPTIBILITIES TO FOLLOW Performed at [facility], [address] [place], [place] [address]    Report Status PENDING  Incomplete    Labs: CBC: Recent Labs  Lab 10/04/23 1205 10/05/23 0532  WBC 6.2 6.0  NEUTROABS 4.0  --   HGB 11.5* 12.3  HCT 35.1* 36.9  MCV 90.0 88.5  PLT 196 220   Basic Metabolic Panel: Recent Labs  Lab 10/04/23 1205 10/05/23 0532  NA 138 139  K 3.6 4.1  CL 103 109  CO2 24 22  GLUCOSE 103* 88   BUN 11 8  CREATININE 1.00 0.77  CALCIUM  8.6* 8.5*   Liver Function Tests: Recent Labs  Lab 10/04/23 1205  AST 14*  ALT 10  ALKPHOS 65  BILITOT 0.9  PROT 5.9*  ALBUMIN  3.2*   CBG: No results for input(s): GLUCAP in the last 168 hours.  Discharge time spent:  35 minutes.  Signed: Eric Nunnery, MD [facility] 10/06/2023

## 2023-10-06 NOTE — TOC Transition Note (Signed)
 Transition of Care [facility]) - Discharge Note   Patient Details  Name: Jackie Phelps MRN: [id number] Date of Birth: 1931/04/29  Transition of Care [facility]) CM/SW Contact:  Mcarthur Saddie Kim, LCSW Phone Number: 10/06/2023, 2:28 PM   Clinical Narrative:  Pt d/c today. Selinda with [facility] notified. Home health orders in.      Final next level of care: Home w Home Health Services Barriers to Discharge: Barriers Resolved   Patient Goals and CMS Choice Patient states their goals for this hospitalization and ongoing recovery are:: return back with daughter CMS Medicare.gov Compare Post Acute Care list provided to:: Patient Represenative (must comment) Choice offered to / list presented to : Adult Children      Discharge Placement                       Discharge Plan and Services Additional resources added to the After Visit Summary for   In-house Referral: NA   Post Acute Care Choice: Home Health                    HH Arranged: PT [facility] Agency: [facility] Date HH Agency Contacted: 10/05/23 Time HH Agency Contacted: [id number] Representative spoke with at [facility] Agency: Selinda  Social Drivers of Health (SDOH) Interventions SDOH Screenings   Food Insecurity: No Food Insecurity (10/04/2023)  Housing: Low Risk  (10/04/2023)  Transportation Needs: No Transportation Needs (10/04/2023)  Utilities: Not At Risk (10/04/2023)  Depression (PHQ2-9): Low Risk  (12/19/2019)  Financial Resource Strain: Low Risk  (06/03/2023)   Received from [facility]  Social Connections: Moderately Integrated (10/04/2023)  Tobacco Use: Medium Risk (09/12/2023)     Readmission Risk Interventions    10/05/2023    3:45 PM 09/15/2023   10:42 AM 06/08/2023   11:36 AM  Readmission Risk Prevention Plan  Transportation Screening Complete Complete Complete  PCP or Specialist Appt within 3-5 Days  Complete   HRI or Home Care Consult Complete Complete Complete  Social Work  Consult for Recovery Care Planning/Counseling Complete  Complete  Palliative Care Screening Not Applicable Not Applicable Not Applicable  Medication Review Oceanographer) Complete Complete Complete

## 2023-10-06 NOTE — Plan of Care (Signed)
   Problem: Activity: Goal: Risk for activity intolerance will decrease Outcome: Progressing   Problem: Nutrition: Goal: Adequate nutrition will be maintained Outcome: Progressing   Problem: Coping: Goal: Level of anxiety will decrease Outcome: Progressing

## 2023-10-07 LAB — URINE CULTURE: Culture: >=100000 — AB

## 2023-10-27 ENCOUNTER — Ambulatory Visit

## 2023-10-27 DIAGNOSIS — I442 Atrioventricular block, complete: Secondary | ICD-10-CM

## 2023-10-29 LAB — CUP PACEART REMOTE DEVICE CHECK
Battery Remaining Longevity: 95 mo
Battery Remaining Percentage: 95.5 %
Battery Voltage: 3.04 V
Brady Statistic AP VP Percent: 1 %
Brady Statistic AP VS Percent: 1 %
Brady Statistic AS VP Percent: 99 %
Brady Statistic AS VS Percent: 1 %
Brady Statistic RA Percent Paced: 1 %
Brady Statistic RV Percent Paced: 99 %
Date Time Interrogation Session: 20251021040031
Implantable Lead Connection Status: 753985
Implantable Lead Connection Status: 753985
Implantable Lead Implant Date: 20250908
Implantable Lead Implant Date: 20250908
Implantable Lead Location: 753859
Implantable Lead Location: 753860
Implantable Pulse Generator Implant Date: 20250908
Lead Channel Impedance Value: 350 Ohm
Lead Channel Impedance Value: 480 Ohm
Lead Channel Pacing Threshold Amplitude: 0.625 V
Lead Channel Pacing Threshold Amplitude: 1.25 V
Lead Channel Pacing Threshold Pulse Width: 0.5 ms
Lead Channel Pacing Threshold Pulse Width: 0.5 ms
Lead Channel Sensing Intrinsic Amplitude: 1.7 mV
Lead Channel Sensing Intrinsic Amplitude: 7.5 mV
Lead Channel Setting Pacing Amplitude: 0.875
Lead Channel Setting Pacing Amplitude: 3.5 V
Lead Channel Setting Pacing Pulse Width: 0.5 ms
Lead Channel Setting Sensing Sensitivity: 2 mV
Pulse Gen Model: 2272
Pulse Gen Serial Number: 8296857

## 2023-10-30 NOTE — Progress Notes (Signed)
 Remote PPM Transmission

## 2023-10-31 ENCOUNTER — Ambulatory Visit: Payer: Self-pay | Admitting: Cardiology

## 2023-11-22 ENCOUNTER — Encounter: Payer: Self-pay | Admitting: Cardiology

## 2023-12-15 ENCOUNTER — Encounter: Admitting: Cardiology

## 2024-01-19 ENCOUNTER — Encounter: Payer: Self-pay | Admitting: Cardiology

## 2024-01-26 ENCOUNTER — Encounter

## 2024-02-07 DEATH — deceased

## 2024-04-26 ENCOUNTER — Encounter

## 2024-07-26 ENCOUNTER — Encounter

## 2024-10-25 ENCOUNTER — Encounter
